# Patient Record
Sex: Female | Born: 1945 | Race: Black or African American | Hispanic: No | Marital: Single | Smoking: Never smoker
Health system: Southern US, Community
[De-identification: ages and names within clinical notes are randomized; demographics above are authoritative.]

---

## 1999-09-18 ENCOUNTER — Emergency Department (HOSPITAL_COMMUNITY): Admission: EM | Admit: 1999-09-18 | Discharge: 1999-09-18 | Payer: Self-pay | Admitting: Emergency Medicine

## 2004-03-29 ENCOUNTER — Encounter: Admission: RE | Admit: 2004-03-29 | Discharge: 2004-03-29 | Payer: Self-pay | Admitting: Orthopedic Surgery

## 2012-11-21 ENCOUNTER — Other Ambulatory Visit: Payer: Self-pay | Admitting: Family Medicine

## 2012-11-21 ENCOUNTER — Ambulatory Visit
Admission: RE | Admit: 2012-11-21 | Discharge: 2012-11-21 | Disposition: A | Discharge status: Home / Self Care | Payer: Medicare Other | Source: Ambulatory Visit | Attending: Family Medicine | Admitting: Family Medicine

## 2018-07-24 ENCOUNTER — Encounter (HOSPITAL_COMMUNITY): Payer: Self-pay

## 2018-07-24 ENCOUNTER — Other Ambulatory Visit: Payer: Self-pay

## 2018-07-24 ENCOUNTER — Inpatient Hospital Stay (HOSPITAL_COMMUNITY)
Admission: EM | Admit: 2018-07-24 | Discharge: 2018-08-09 | DRG: 003 | Disposition: A | Discharge status: Other Acute Inpatient Hospital | Payer: Medicare Other | Attending: Internal Medicine | Admitting: Internal Medicine

## 2018-07-24 ENCOUNTER — Emergency Department (HOSPITAL_COMMUNITY): Payer: Medicare Other

## 2018-07-24 DIAGNOSIS — I77819 Aortic ectasia, unspecified site: Secondary | ICD-10-CM | POA: Diagnosis present

## 2018-07-24 DIAGNOSIS — K08419 Partial loss of teeth due to trauma, unspecified class: Secondary | ICD-10-CM

## 2018-07-24 DIAGNOSIS — R1310 Dysphagia, unspecified: Secondary | ICD-10-CM | POA: Diagnosis not present

## 2018-07-24 DIAGNOSIS — G936 Cerebral edema: Secondary | ICD-10-CM | POA: Diagnosis not present

## 2018-07-24 DIAGNOSIS — I82433 Acute embolism and thrombosis of popliteal vein, bilateral: Secondary | ICD-10-CM | POA: Diagnosis present

## 2018-07-24 DIAGNOSIS — T189XXA Foreign body of alimentary tract, part unspecified, initial encounter: Secondary | ICD-10-CM | POA: Diagnosis not present

## 2018-07-24 DIAGNOSIS — E860 Dehydration: Secondary | ICD-10-CM | POA: Diagnosis present

## 2018-07-24 DIAGNOSIS — I615 Nontraumatic intracerebral hemorrhage, intraventricular: Secondary | ICD-10-CM

## 2018-07-24 DIAGNOSIS — G935 Compression of brain: Secondary | ICD-10-CM | POA: Diagnosis not present

## 2018-07-24 DIAGNOSIS — E876 Hypokalemia: Secondary | ICD-10-CM | POA: Diagnosis not present

## 2018-07-24 DIAGNOSIS — I272 Pulmonary hypertension, unspecified: Secondary | ICD-10-CM | POA: Diagnosis present

## 2018-07-24 DIAGNOSIS — N179 Acute kidney failure, unspecified: Secondary | ICD-10-CM | POA: Diagnosis present

## 2018-07-24 DIAGNOSIS — R778 Other specified abnormalities of plasma proteins: Secondary | ICD-10-CM | POA: Diagnosis present

## 2018-07-24 DIAGNOSIS — I2510 Atherosclerotic heart disease of native coronary artery without angina pectoris: Secondary | ICD-10-CM | POA: Diagnosis present

## 2018-07-24 DIAGNOSIS — I619 Nontraumatic intracerebral hemorrhage, unspecified: Secondary | ICD-10-CM | POA: Diagnosis present

## 2018-07-24 DIAGNOSIS — J9601 Acute respiratory failure with hypoxia: Secondary | ICD-10-CM | POA: Diagnosis not present

## 2018-07-24 DIAGNOSIS — F0390 Unspecified dementia without behavioral disturbance: Secondary | ICD-10-CM | POA: Diagnosis present

## 2018-07-24 DIAGNOSIS — J9600 Acute respiratory failure, unspecified whether with hypoxia or hypercapnia: Secondary | ICD-10-CM | POA: Diagnosis present

## 2018-07-24 DIAGNOSIS — Z79899 Other long term (current) drug therapy: Secondary | ICD-10-CM

## 2018-07-24 DIAGNOSIS — A4151 Sepsis due to Escherichia coli [E. coli]: Principal | ICD-10-CM | POA: Diagnosis present

## 2018-07-24 DIAGNOSIS — R7989 Other specified abnormal findings of blood chemistry: Secondary | ICD-10-CM | POA: Diagnosis present

## 2018-07-24 DIAGNOSIS — Z789 Other specified health status: Secondary | ICD-10-CM | POA: Diagnosis present

## 2018-07-24 DIAGNOSIS — E162 Hypoglycemia, unspecified: Secondary | ICD-10-CM | POA: Diagnosis not present

## 2018-07-24 DIAGNOSIS — I82461 Acute embolism and thrombosis of right calf muscular vein: Secondary | ICD-10-CM | POA: Diagnosis present

## 2018-07-24 DIAGNOSIS — I611 Nontraumatic intracerebral hemorrhage in hemisphere, cortical: Secondary | ICD-10-CM | POA: Diagnosis not present

## 2018-07-24 DIAGNOSIS — K5730 Diverticulosis of large intestine without perforation or abscess without bleeding: Secondary | ICD-10-CM | POA: Diagnosis present

## 2018-07-24 DIAGNOSIS — K0889 Other specified disorders of teeth and supporting structures: Secondary | ICD-10-CM | POA: Diagnosis present

## 2018-07-24 DIAGNOSIS — R4701 Aphasia: Secondary | ICD-10-CM | POA: Diagnosis not present

## 2018-07-24 DIAGNOSIS — I82443 Acute embolism and thrombosis of tibial vein, bilateral: Secondary | ICD-10-CM | POA: Diagnosis present

## 2018-07-24 DIAGNOSIS — I119 Hypertensive heart disease without heart failure: Secondary | ICD-10-CM | POA: Diagnosis present

## 2018-07-24 DIAGNOSIS — I2699 Other pulmonary embolism without acute cor pulmonale: Secondary | ICD-10-CM | POA: Diagnosis present

## 2018-07-24 DIAGNOSIS — R531 Weakness: Secondary | ICD-10-CM

## 2018-07-24 DIAGNOSIS — Z8042 Family history of malignant neoplasm of prostate: Secondary | ICD-10-CM

## 2018-07-24 DIAGNOSIS — G9341 Metabolic encephalopathy: Secondary | ICD-10-CM | POA: Diagnosis present

## 2018-07-24 DIAGNOSIS — I824Y3 Acute embolism and thrombosis of unspecified deep veins of proximal lower extremity, bilateral: Secondary | ICD-10-CM | POA: Diagnosis present

## 2018-07-24 DIAGNOSIS — E872 Acidosis: Secondary | ICD-10-CM | POA: Diagnosis present

## 2018-07-24 DIAGNOSIS — X58XXXA Exposure to other specified factors, initial encounter: Secondary | ICD-10-CM | POA: Diagnosis not present

## 2018-07-24 DIAGNOSIS — I82452 Acute embolism and thrombosis of left peroneal vein: Secondary | ICD-10-CM | POA: Diagnosis present

## 2018-07-24 DIAGNOSIS — R339 Retention of urine, unspecified: Secondary | ICD-10-CM | POA: Diagnosis not present

## 2018-07-24 DIAGNOSIS — Z430 Encounter for attention to tracheostomy: Secondary | ICD-10-CM

## 2018-07-24 DIAGNOSIS — J9690 Respiratory failure, unspecified, unspecified whether with hypoxia or hypercapnia: Secondary | ICD-10-CM

## 2018-07-24 DIAGNOSIS — I700 Atherosclerosis of aorta: Secondary | ICD-10-CM | POA: Diagnosis present

## 2018-07-24 DIAGNOSIS — Y92230 Patient room in hospital as the place of occurrence of the external cause: Secondary | ICD-10-CM | POA: Diagnosis not present

## 2018-07-24 DIAGNOSIS — E870 Hyperosmolality and hypernatremia: Secondary | ICD-10-CM | POA: Diagnosis present

## 2018-07-24 DIAGNOSIS — Z930 Tracheostomy status: Secondary | ICD-10-CM

## 2018-07-24 DIAGNOSIS — E559 Vitamin D deficiency, unspecified: Secondary | ICD-10-CM | POA: Diagnosis present

## 2018-07-24 DIAGNOSIS — I340 Nonrheumatic mitral (valve) insufficiency: Secondary | ICD-10-CM | POA: Diagnosis present

## 2018-07-24 DIAGNOSIS — T45515A Adverse effect of anticoagulants, initial encounter: Secondary | ICD-10-CM | POA: Diagnosis not present

## 2018-07-24 DIAGNOSIS — R197 Diarrhea, unspecified: Secondary | ICD-10-CM | POA: Diagnosis not present

## 2018-07-24 DIAGNOSIS — J189 Pneumonia, unspecified organism: Secondary | ICD-10-CM | POA: Diagnosis present

## 2018-07-24 DIAGNOSIS — Z4659 Encounter for fitting and adjustment of other gastrointestinal appliance and device: Secondary | ICD-10-CM

## 2018-07-24 DIAGNOSIS — N390 Urinary tract infection, site not specified: Secondary | ICD-10-CM | POA: Diagnosis present

## 2018-07-24 LAB — I-STAT CG4 LACTIC ACID, ED
LACTIC ACID, VENOUS: 1.55 mmol/L (ref 0.5–1.9)
Lactic Acid, Venous: 2.59 mmol/L (ref 0.5–1.9)

## 2018-07-24 LAB — CBC WITH DIFFERENTIAL/PLATELET
Abs Immature Granulocytes: 0.05 10*3/uL (ref 0.00–0.07)
Basophils Absolute: 0 10*3/uL (ref 0.0–0.1)
Basophils Relative: 0 %
EOS ABS: 0 10*3/uL (ref 0.0–0.5)
EOS PCT: 0 %
HEMATOCRIT: 54.5 % — AB (ref 36.0–46.0)
Hemoglobin: 17.4 g/dL — ABNORMAL HIGH (ref 12.0–15.0)
Immature Granulocytes: 1 %
LYMPHS ABS: 0.6 10*3/uL — AB (ref 0.7–4.0)
Lymphocytes Relative: 6 %
MCH: 29.6 pg (ref 26.0–34.0)
MCHC: 31.9 g/dL (ref 30.0–36.0)
MCV: 92.7 fL (ref 80.0–100.0)
MONOS PCT: 7 %
Monocytes Absolute: 0.7 10*3/uL (ref 0.1–1.0)
Neutro Abs: 8.7 10*3/uL — ABNORMAL HIGH (ref 1.7–7.7)
Neutrophils Relative %: 86 %
Platelets: 239 10*3/uL (ref 150–400)
RBC: 5.88 MIL/uL — ABNORMAL HIGH (ref 3.87–5.11)
RDW: 15.3 % (ref 11.5–15.5)
WBC: 10.1 10*3/uL (ref 4.0–10.5)
nRBC: 0 % (ref 0.0–0.2)

## 2018-07-24 LAB — COMPREHENSIVE METABOLIC PANEL
ALT: 18 U/L (ref 0–44)
ANION GAP: 14 (ref 5–15)
AST: 22 U/L (ref 15–41)
Albumin: 3.8 g/dL (ref 3.5–5.0)
Alkaline Phosphatase: 67 U/L (ref 38–126)
BILIRUBIN TOTAL: 1.2 mg/dL (ref 0.3–1.2)
BUN: 63 mg/dL — AB (ref 8–23)
CALCIUM: 9.8 mg/dL (ref 8.9–10.3)
CO2: 22 mmol/L (ref 22–32)
Chloride: 107 mmol/L (ref 98–111)
Creatinine, Ser: 1.06 mg/dL — ABNORMAL HIGH (ref 0.44–1.00)
GFR calc Af Amer: 58 mL/min — ABNORMAL LOW (ref >60)
GFR, EST NON AFRICAN AMERICAN: 50 mL/min — AB (ref >60)
Glucose, Bld: 132 mg/dL — ABNORMAL HIGH (ref 70–99)
POTASSIUM: 3.9 mmol/L (ref 3.5–5.1)
Sodium: 143 mmol/L (ref 135–145)
TOTAL PROTEIN: 9.2 g/dL — AB (ref 6.5–8.1)

## 2018-07-24 LAB — I-STAT TROPONIN, ED: Troponin i, poc: 0.15 ng/mL (ref 0.00–0.08)

## 2018-07-24 LAB — PROTIME-INR
INR: 1.09
Prothrombin Time: 14 seconds (ref 11.4–15.2)

## 2018-07-24 LAB — CBG MONITORING, ED: GLUCOSE-CAPILLARY: 82 mg/dL (ref 70–99)

## 2018-07-24 LAB — CK: CK TOTAL: 145 U/L (ref 38–234)

## 2018-07-24 MED ORDER — SODIUM CHLORIDE 0.9 % IV SOLN
Freq: Once | INTRAVENOUS | Status: AC
  Administered 2018-07-24: via INTRAVENOUS

## 2018-07-24 MED ORDER — SODIUM CHLORIDE 0.9 % IV BOLUS
1000.0000 mL | Freq: Once | INTRAVENOUS | Status: AC
  Administered 2018-07-24: 1000 mL via INTRAVENOUS

## 2018-07-24 NOTE — ED Notes (Signed)
Bed: [id number] Expected date:  Expected time:  Means of arrival:  Comments: EMS 72 yr old UTI

## 2018-07-24 NOTE — ED Provider Notes (Signed)
[facility]-EMERGENCY DEPT Provider Note   CSN: [id number] Arrival date & time: 07/24/18  1853     History   Chief Complaint Chief Complaint  Patient presents with  . Weakness    HPI Hannah Holloway is a 72 y.o. female.  72 year old female with prior medical history as detailed below presents for evaluation. Patient reports that she lives with her sister. Her sister went to [place] 4 days ago. Patient reports increased weakness over the last 3 days.  EMS reports that they were at least 4 days of unopened newspapers at the door.  Patient complains of generalized weakness.  She denies chest pain, fever, shortness of breath, nausea, vomiting, or abdominal pain. Her clothes are soaked in urine - she reports that she has been too weak to walk to the bathroom for at least the last 2 days.   The history is provided by the patient and medical records.  Illness  This is a new problem. The current episode started more than 2 days ago. The problem occurs rarely. The problem has not changed since onset.Pertinent negatives include no chest pain, no abdominal pain, no headaches and no shortness of breath. Nothing aggravates the symptoms. Nothing relieves the symptoms. She has tried nothing for the symptoms.    History reviewed. No pertinent past medical history.  There are no active problems to display for this patient.   History reviewed. No pertinent surgical history.   OB History   None      Home Medications    Prior to Admission medications   Medication Sig Start Date End Date Taking? Authorizing Provider  Cholecalciferol (VITAMIN D3) 25 MCG (1000 UT) CAPS Take 1,000 Units by mouth daily.   Yes [provider]  vitamin E 400 UNIT capsule Take 400 Units by mouth daily.   Yes [provider]    Family History No family history on file.  Social History Social History   Tobacco Use  . Smoking status: Not on file  Substance Use Topics  .  Alcohol use: Not on file  . Drug use: Not on file     Allergies   Patient has no known allergies.   Review of Systems Review of Systems  Constitutional: Positive for fatigue.  Respiratory: Negative for shortness of breath.   Cardiovascular: Negative for chest pain.  Gastrointestinal: Negative for abdominal pain.  Neurological: Negative for headaches.  All other systems reviewed and are negative.    Physical Exam Updated Vital Signs BP (!) 171/89   Pulse (!) 40   Temp 97.7 F (36.5 C) (Oral)   Resp (!) 28   Ht 5\' 6"  (1.676 m)   Wt 72.6 kg   SpO2 (!) 81%   BMI 25.82 kg/m   Physical Exam  Constitutional: She appears well-developed and well-nourished. No distress.  HENT:  Head: Normocephalic and atraumatic.  Mouth/Throat: Oropharynx is clear and moist.  Eyes: Pupils are equal, round, and reactive to light. Conjunctivae and EOM are normal.  Neck: Normal range of motion. Neck supple.  Cardiovascular: Normal rate, regular rhythm and normal heart sounds.  Pulmonary/Chest: Effort normal and breath sounds normal. No respiratory distress.  Abdominal: Soft. She exhibits no distension. There is no tenderness.  Musculoskeletal: Normal range of motion. She exhibits no edema or deformity.  Neurological: She is alert.  Alert Normal speech No facial droop VAN negative   Skin: Skin is warm and dry.  Psychiatric: She has a normal mood and affect.  Nursing note and vitals reviewed.    ED Treatments / Results  Labs (all labs ordered are listed, but only abnormal results are displayed) Labs Reviewed  CBC WITH DIFFERENTIAL/PLATELET - Abnormal; Notable for the following components:      Result Value   RBC 5.88 (*)    Hemoglobin 17.4 (*)    HCT 54.5 (*)    Neutro Abs 8.7 (*)    Lymphs Abs 0.6 (*)    All other components within normal limits  COMPREHENSIVE METABOLIC PANEL - Abnormal; Notable for the following components:   Glucose, Bld 132 (*)    BUN 63 (*)    Creatinine,  Ser 1.06 (*)    Total Protein 9.2 (*)    GFR calc non Af Amer 50 (*)    GFR calc Af Amer 58 (*)    All other components within normal limits  I-STAT TROPONIN, ED - Abnormal; Notable for the following components:   Troponin i, poc 0.15 (*)    All other components within normal limits  I-STAT CG4 LACTIC ACID, ED - Abnormal; Notable for the following components:   Lactic Acid, Venous 2.59 (*)    All other components within normal limits  URINE CULTURE  CULTURE, BLOOD (ROUTINE X 2)  CULTURE, BLOOD (ROUTINE X 2)  PROTIME-INR  URINALYSIS, ROUTINE W REFLEX MICROSCOPIC  CK  CBG MONITORING, ED  I-STAT CG4 LACTIC ACID, ED    EKG EKG Interpretation  Date/Time:  Wednesday July 24 2018 20:03:18 EST Ventricular Rate:  100 PR Interval:    QRS Duration: 83 QT Interval:  348 QTC Calculation: 449 R Axis:   -38 Text Interpretation:  Sinus tachycardia Biatrial enlargement Left ventricular hypertrophy Confirmed by Kristine RoyalMessick,  [phone]([id number]) on 07/24/2018 8:06:02 PM   Radiology Dg Chest Port 1 View  Result Date: 07/24/2018 CLINICAL DATA:  Weakness EXAM: PORTABLE CHEST 1 VIEW COMPARISON:  11/21/2012 FINDINGS: New elevation of the right hemidiaphragm. Accentuated fullness in the AP window region compared to prior. Atherosclerotic calcification of the aortic arch. IMPRESSION: 1. New elevation of the right hemidiaphragm. 2. Accentuated fullness in the AP window region compared to prior, cannot exclude adenopathy or mass. Consider chest CT for further evaluation. Electronically Signed   By: Gaylyn RongWalter  Liebkemann M.D.   On: 07/24/2018 20:34    Procedures Procedures (including critical care time)  Medications Ordered in ED Medications  0.9 %  sodium chloride infusion (has no administration in time range)  sodium chloride 0.9 % bolus 1,000 mL (0 mLs Intravenous Stopped 07/24/18 2116)     Initial Impression / Assessment and Plan / ED Course  I have reviewed the triage vital signs and the nursing  notes.  Pertinent labs & imaging results that were available during my care of the patient were reviewed by me and considered in my medical decision making (see chart for details).     MDM  Screen complete  Patient is presenting for generalized weakness.  Patient with minimal history obtained from family/bystanders. It appears as though the patient has had minimal care for the last several days.    Screening exam and work-up suggest mild to moderate dehydration. Patient also with mildly elevated troponin.   Hospitalist service [facility] is aware of case and will evaluate for admission. UA and CT Head pending at time of admission.     Final Clinical Impressions(s) / ED Diagnoses   Final diagnoses:  Weakness  Elevated troponin    ED Discharge Orders    None  Wynetta Fines, MD 07/25/18 [phone]

## 2018-07-24 NOTE — ED Notes (Signed)
Pt is still unable to give urine specimen at this time.

## 2018-07-24 NOTE — ED Notes (Signed)
Pt aware that urine sample is needed. Purewick attached

## 2018-07-24 NOTE — ED Triage Notes (Signed)
This pt. Normally lives with her sister. Her sister has recently been out of town; therefore she had family visit today who hadn't seen pt. In several days. They found pt. To be more weak and "less responsive" than normal. Paramedics observed four days worth of uncollected newspapers on her door step. Pt. Arrives in no distress and is a bit drowsy. She is aware of her situation and tells us she is not having any pain or discomfort. She has been incontinent of urine.

## 2018-07-25 ENCOUNTER — Observation Stay (HOSPITAL_COMMUNITY): Payer: Medicare Other

## 2018-07-25 ENCOUNTER — Inpatient Hospital Stay (HOSPITAL_COMMUNITY): Payer: Medicare Other | Admitting: Anesthesiology

## 2018-07-25 ENCOUNTER — Emergency Department (HOSPITAL_COMMUNITY): Payer: Medicare Other

## 2018-07-25 ENCOUNTER — Observation Stay (HOSPITAL_COMMUNITY)
Admit: 2018-07-25 | Discharge: 2018-07-25 | Disposition: A | Discharge status: Home / Self Care | Payer: Medicare Other | Attending: Family Medicine | Admitting: Family Medicine

## 2018-07-25 ENCOUNTER — Inpatient Hospital Stay (HOSPITAL_COMMUNITY): Payer: Medicare Other

## 2018-07-25 ENCOUNTER — Encounter (HOSPITAL_COMMUNITY): Payer: Self-pay | Admitting: Internal Medicine

## 2018-07-25 DIAGNOSIS — I2699 Other pulmonary embolism without acute cor pulmonale: Secondary | ICD-10-CM | POA: Diagnosis not present

## 2018-07-25 DIAGNOSIS — R531 Weakness: Secondary | ICD-10-CM | POA: Diagnosis present

## 2018-07-25 DIAGNOSIS — R778 Other specified abnormalities of plasma proteins: Secondary | ICD-10-CM | POA: Diagnosis present

## 2018-07-25 DIAGNOSIS — R7989 Other specified abnormal findings of blood chemistry: Secondary | ICD-10-CM

## 2018-07-25 DIAGNOSIS — R4701 Aphasia: Secondary | ICD-10-CM | POA: Diagnosis not present

## 2018-07-25 DIAGNOSIS — G935 Compression of brain: Secondary | ICD-10-CM | POA: Diagnosis not present

## 2018-07-25 DIAGNOSIS — I824Y3 Acute embolism and thrombosis of unspecified deep veins of proximal lower extremity, bilateral: Secondary | ICD-10-CM | POA: Diagnosis present

## 2018-07-25 DIAGNOSIS — N179 Acute kidney failure, unspecified: Secondary | ICD-10-CM | POA: Diagnosis present

## 2018-07-25 DIAGNOSIS — E860 Dehydration: Secondary | ICD-10-CM | POA: Diagnosis present

## 2018-07-25 DIAGNOSIS — R1310 Dysphagia, unspecified: Secondary | ICD-10-CM | POA: Diagnosis not present

## 2018-07-25 DIAGNOSIS — I615 Nontraumatic intracerebral hemorrhage, intraventricular: Secondary | ICD-10-CM | POA: Diagnosis not present

## 2018-07-25 DIAGNOSIS — I82452 Acute embolism and thrombosis of left peroneal vein: Secondary | ICD-10-CM | POA: Diagnosis present

## 2018-07-25 DIAGNOSIS — R0602 Shortness of breath: Secondary | ICD-10-CM

## 2018-07-25 DIAGNOSIS — E870 Hyperosmolality and hypernatremia: Secondary | ICD-10-CM | POA: Diagnosis present

## 2018-07-25 DIAGNOSIS — I82443 Acute embolism and thrombosis of tibial vein, bilateral: Secondary | ICD-10-CM | POA: Diagnosis present

## 2018-07-25 DIAGNOSIS — Z789 Other specified health status: Secondary | ICD-10-CM | POA: Diagnosis present

## 2018-07-25 DIAGNOSIS — J189 Pneumonia, unspecified organism: Secondary | ICD-10-CM | POA: Diagnosis present

## 2018-07-25 DIAGNOSIS — J988 Other specified respiratory disorders: Secondary | ICD-10-CM | POA: Diagnosis not present

## 2018-07-25 DIAGNOSIS — J9601 Acute respiratory failure with hypoxia: Secondary | ICD-10-CM | POA: Diagnosis not present

## 2018-07-25 DIAGNOSIS — I82433 Acute embolism and thrombosis of popliteal vein, bilateral: Secondary | ICD-10-CM | POA: Diagnosis present

## 2018-07-25 DIAGNOSIS — E872 Acidosis: Secondary | ICD-10-CM | POA: Diagnosis present

## 2018-07-25 DIAGNOSIS — X58XXXA Exposure to other specified factors, initial encounter: Secondary | ICD-10-CM | POA: Diagnosis not present

## 2018-07-25 DIAGNOSIS — A4151 Sepsis due to Escherichia coli [E. coli]: Secondary | ICD-10-CM | POA: Diagnosis present

## 2018-07-25 DIAGNOSIS — I272 Pulmonary hypertension, unspecified: Secondary | ICD-10-CM | POA: Diagnosis present

## 2018-07-25 DIAGNOSIS — I340 Nonrheumatic mitral (valve) insufficiency: Secondary | ICD-10-CM

## 2018-07-25 DIAGNOSIS — I619 Nontraumatic intracerebral hemorrhage, unspecified: Secondary | ICD-10-CM

## 2018-07-25 DIAGNOSIS — T189XXA Foreign body of alimentary tract, part unspecified, initial encounter: Secondary | ICD-10-CM | POA: Diagnosis not present

## 2018-07-25 DIAGNOSIS — N390 Urinary tract infection, site not specified: Secondary | ICD-10-CM | POA: Diagnosis present

## 2018-07-25 DIAGNOSIS — F0390 Unspecified dementia without behavioral disturbance: Secondary | ICD-10-CM | POA: Diagnosis present

## 2018-07-25 DIAGNOSIS — I119 Hypertensive heart disease without heart failure: Secondary | ICD-10-CM | POA: Diagnosis present

## 2018-07-25 DIAGNOSIS — I82461 Acute embolism and thrombosis of right calf muscular vein: Secondary | ICD-10-CM | POA: Diagnosis present

## 2018-07-25 DIAGNOSIS — G9341 Metabolic encephalopathy: Secondary | ICD-10-CM | POA: Diagnosis not present

## 2018-07-25 DIAGNOSIS — Y92230 Patient room in hospital as the place of occurrence of the external cause: Secondary | ICD-10-CM | POA: Diagnosis not present

## 2018-07-25 DIAGNOSIS — I611 Nontraumatic intracerebral hemorrhage in hemisphere, cortical: Secondary | ICD-10-CM | POA: Diagnosis not present

## 2018-07-25 DIAGNOSIS — G936 Cerebral edema: Secondary | ICD-10-CM | POA: Diagnosis not present

## 2018-07-25 DIAGNOSIS — R29898 Other symptoms and signs involving the musculoskeletal system: Secondary | ICD-10-CM

## 2018-07-25 LAB — CBC
HCT: 43.6 % (ref 36.0–46.0)
Hemoglobin: 14.3 g/dL (ref 12.0–15.0)
MCH: 30.4 pg (ref 26.0–34.0)
MCHC: 32.8 g/dL (ref 30.0–36.0)
MCV: 92.6 fL (ref 80.0–100.0)
NRBC: 0 % (ref 0.0–0.2)
PLATELETS: 188 10*3/uL (ref 150–400)
RBC: 4.71 MIL/uL (ref 3.87–5.11)
RDW: 15.3 % (ref 11.5–15.5)
WBC: 11.1 10*3/uL — ABNORMAL HIGH (ref 4.0–10.5)

## 2018-07-25 LAB — BLOOD GAS, ARTERIAL
Acid-Base Excess: 1.4 mmol/L (ref 0.0–2.0)
Bicarbonate: 23.9 mmol/L (ref 20.0–28.0)
Drawn by: 235321
FIO2: 100
LHR: 18 {breaths}/min
MECHVT: 470 mL
O2 Saturation: 99.9 %
PATIENT TEMPERATURE: 98.6
PEEP: 5 cmH2O
PO2 ART: 382 mmHg — AB (ref 83.0–108.0)
pCO2 arterial: 32.2 mmHg (ref 32.0–48.0)
pH, Arterial: 7.482 — ABNORMAL HIGH (ref 7.350–7.450)

## 2018-07-25 LAB — PHOSPHORUS: PHOSPHORUS: 1.9 mg/dL — AB (ref 2.5–4.6)

## 2018-07-25 LAB — URINALYSIS, ROUTINE W REFLEX MICROSCOPIC
Bilirubin Urine: NEGATIVE
Glucose, UA: NEGATIVE mg/dL
Ketones, ur: 20 mg/dL — AB
Nitrite: NEGATIVE
Protein, ur: 100 mg/dL — AB
Specific Gravity, Urine: 1.023 (ref 1.005–1.030)
pH: 5 (ref 5.0–8.0)

## 2018-07-25 LAB — TROPONIN I
TROPONIN I: 0.1 ng/mL — AB (ref <0.03)
TROPONIN I: 0.06 ng/mL — AB (ref <0.03)
Troponin I: 0.13 ng/mL (ref <0.03)

## 2018-07-25 LAB — ECHOCARDIOGRAM COMPLETE
Height: 66 in
Weight: 2560 oz

## 2018-07-25 LAB — COMPREHENSIVE METABOLIC PANEL
ALT: 17 U/L (ref 0–44)
ANION GAP: 12 (ref 5–15)
AST: 25 U/L (ref 15–41)
Albumin: 3.1 g/dL — ABNORMAL LOW (ref 3.5–5.0)
Alkaline Phosphatase: 53 U/L (ref 38–126)
BUN: 41 mg/dL — ABNORMAL HIGH (ref 8–23)
CHLORIDE: 114 mmol/L — AB (ref 98–111)
CO2: 21 mmol/L — AB (ref 22–32)
Calcium: 9.4 mg/dL (ref 8.9–10.3)
Creatinine, Ser: 0.68 mg/dL (ref 0.44–1.00)
GFR calc Af Amer: >60 mL/min (ref >60)
GFR calc non Af Amer: >60 mL/min (ref >60)
Glucose, Bld: 128 mg/dL — ABNORMAL HIGH (ref 70–99)
POTASSIUM: 3.9 mmol/L (ref 3.5–5.1)
SODIUM: 147 mmol/L — AB (ref 135–145)
Total Bilirubin: 1.5 mg/dL — ABNORMAL HIGH (ref 0.3–1.2)
Total Protein: 7.8 g/dL (ref 6.5–8.1)

## 2018-07-25 LAB — SODIUM: SODIUM: 144 mmol/L (ref 135–145)

## 2018-07-25 LAB — PROTIME-INR
INR: 1.09
PROTHROMBIN TIME: 14 s (ref 11.4–15.2)

## 2018-07-25 LAB — HEPARIN LEVEL (UNFRACTIONATED): Heparin Unfractionated: 0.28 IU/mL — ABNORMAL LOW (ref 0.30–0.70)

## 2018-07-25 LAB — MAGNESIUM: Magnesium: 2.6 mg/dL — ABNORMAL HIGH (ref 1.7–2.4)

## 2018-07-25 LAB — HEMOGLOBIN A1C
Hgb A1c MFr Bld: 5.7 % — ABNORMAL HIGH (ref 4.8–5.6)
Mean Plasma Glucose: 116.89 mg/dL

## 2018-07-25 LAB — D-DIMER, QUANTITATIVE (NOT AT ARMC)

## 2018-07-25 LAB — STREP PNEUMONIAE URINARY ANTIGEN: Strep Pneumo Urinary Antigen: NEGATIVE

## 2018-07-25 LAB — TSH: TSH: 0.568 u[IU]/mL (ref 0.350–4.500)

## 2018-07-25 MED ORDER — FENTANYL 2500MCG IN NS 250ML (10MCG/ML) PREMIX INFUSION
0.0000 ug/h | INTRAVENOUS | Status: DC
  Administered 2018-07-25: 25 ug/h via INTRAVENOUS
  Administered 2018-07-27 – 2018-07-29 (×2): 50 ug/h via INTRAVENOUS
  Filled 2018-07-25 (×3): qty 250

## 2018-07-25 MED ORDER — NICARDIPINE HCL IN NACL 20-0.86 MG/200ML-% IV SOLN
0.0000 mg/h | INTRAVENOUS | Status: DC
  Administered 2018-07-25: 5 mg/h via INTRAVENOUS
  Administered 2018-07-25: 7.5 mg/h via INTRAVENOUS
  Administered 2018-07-25 – 2018-07-26 (×6): 10 mg/h via INTRAVENOUS
  Filled 2018-07-25 (×8): qty 200

## 2018-07-25 MED ORDER — IOPAMIDOL (ISOVUE-370) INJECTION 76%
INTRAVENOUS | Status: AC
  Filled 2018-07-25: qty 100

## 2018-07-25 MED ORDER — PROPOFOL 1000 MG/100ML IV EMUL
5.0000 ug/kg/min | INTRAVENOUS | Status: DC
  Administered 2018-07-25: 5 ug/kg/min via INTRAVENOUS
  Filled 2018-07-25: qty 100

## 2018-07-25 MED ORDER — HEPARIN (PORCINE) 25000 UT/250ML-% IV SOLN
1050.0000 [IU]/h | INTRAVENOUS | Status: DC
  Administered 2018-07-25: 1050 [IU]/h via INTRAVENOUS
  Filled 2018-07-25: qty 250

## 2018-07-25 MED ORDER — PROPOFOL 10 MG/ML IV BOLUS
INTRAVENOUS | Status: AC
  Filled 2018-07-25: qty 20

## 2018-07-25 MED ORDER — MANNITOL 25 % IV SOLN
80.0000 g | Freq: Once | Status: AC
  Administered 2018-07-25: 80 g via INTRAVENOUS
  Filled 2018-07-25: qty 320

## 2018-07-25 MED ORDER — SUCCINYLCHOLINE CHLORIDE 200 MG/10ML IV SOSY
PREFILLED_SYRINGE | INTRAVENOUS | Status: AC
  Filled 2018-07-25: qty 10

## 2018-07-25 MED ORDER — LIDOCAINE HCL (CARDIAC) PF 100 MG/5ML IV SOSY: 1.5000 mg/kg | PREFILLED_SYRINGE | Freq: Once | INTRAVENOUS | Status: DC

## 2018-07-25 MED ORDER — HEPARIN BOLUS VIA INFUSION
2000.0000 [IU] | Freq: Once | INTRAVENOUS | Status: AC
  Administered 2018-07-25: 2000 [IU] via INTRAVENOUS
  Filled 2018-07-25: qty 2000

## 2018-07-25 MED ORDER — POTASSIUM PHOSPHATE MONOBASIC 500 MG PO TABS
500.0000 mg | ORAL_TABLET | Freq: Three times a day (TID) | ORAL | Status: DC
  Filled 2018-07-25 (×5): qty 1

## 2018-07-25 MED ORDER — SODIUM CHLORIDE 0.9 % IV SOLN
1.0000 g | INTRAVENOUS | Status: DC
  Administered 2018-07-25 – 2018-07-31 (×7): 1 g via INTRAVENOUS
  Filled 2018-07-25: qty 1
  Filled 2018-07-25 (×6): qty 10

## 2018-07-25 MED ORDER — CLEVIDIPINE BUTYRATE 0.5 MG/ML IV EMUL
0.0000 mg/h | INTRAVENOUS | Status: DC
  Filled 2018-07-25: qty 50

## 2018-07-25 MED ORDER — GADOBUTROL 1 MMOL/ML IV SOLN
7.0000 mL | Freq: Once | INTRAVENOUS | Status: AC | PRN
  Administered 2018-07-25: 7 mL via INTRAVENOUS

## 2018-07-25 MED ORDER — PROTAMINE SULFATE 10 MG/ML IV SOLN
50.0000 mg | Freq: Once | INTRAVENOUS | Status: AC
  Administered 2018-07-25: 50 mg via INTRAVENOUS
  Filled 2018-07-25: qty 5

## 2018-07-25 MED ORDER — IOPAMIDOL (ISOVUE-370) INJECTION 76%
100.0000 mL | Freq: Once | INTRAVENOUS | Status: AC | PRN
  Administered 2018-07-25: 100 mL via INTRAVENOUS

## 2018-07-25 MED ORDER — IPRATROPIUM-ALBUTEROL 0.5-2.5 (3) MG/3ML IN SOLN
3.0000 mL | Freq: Four times a day (QID) | RESPIRATORY_TRACT | Status: DC
  Administered 2018-07-25 – 2018-07-29 (×15): 3 mL via RESPIRATORY_TRACT
  Filled 2018-07-25 (×15): qty 3

## 2018-07-25 MED ORDER — SODIUM CHLORIDE 0.9 % IV SOLN
INTRAVENOUS | Status: DC
  Administered 2018-07-25: 03:00:00 via INTRAVENOUS

## 2018-07-25 MED ORDER — SODIUM CHLORIDE (PF) 0.9 % IJ SOLN
INTRAMUSCULAR | Status: AC
  Filled 2018-07-25: qty 50

## 2018-07-25 MED ORDER — SUCCINYLCHOLINE CHLORIDE 200 MG/10ML IV SOSY
PREFILLED_SYRINGE | INTRAVENOUS | Status: DC | PRN
  Administered 2018-07-25: 100 mg via INTRAVENOUS

## 2018-07-25 MED ORDER — ONDANSETRON HCL 4 MG/2ML IJ SOLN: 4.0000 mg | Freq: Four times a day (QID) | INTRAMUSCULAR | Status: DC | PRN

## 2018-07-25 MED ORDER — ACETAMINOPHEN 325 MG PO TABS: 650.0000 mg | ORAL_TABLET | Freq: Four times a day (QID) | ORAL | Status: DC | PRN

## 2018-07-25 MED ORDER — SODIUM CHLORIDE 0.9 % IV SOLN
500.0000 mg | INTRAVENOUS | Status: DC
  Administered 2018-07-25 – 2018-07-29 (×5): 500 mg via INTRAVENOUS
  Administered 2018-07-30: 250 mg via INTRAVENOUS
  Administered 2018-07-31: 500 mg via INTRAVENOUS
  Filled 2018-07-25 (×7): qty 500

## 2018-07-25 MED ORDER — SODIUM CHLORIDE 3 % IV SOLN
INTRAVENOUS | Status: DC
  Administered 2018-07-25: 75 mL/h via INTRAVENOUS
  Filled 2018-07-25 (×3): qty 500

## 2018-07-25 MED ORDER — ACETAMINOPHEN 650 MG RE SUPP
650.0000 mg | Freq: Four times a day (QID) | RECTAL | Status: DC | PRN
  Administered 2018-07-26: 650 mg via RECTAL
  Filled 2018-07-25: qty 1

## 2018-07-25 MED ORDER — MANNITOL 20 % IV SOLN
80.0000 g | Freq: Once | Status: DC
  Filled 2018-07-25: qty 400

## 2018-07-25 MED ORDER — DEXTROSE 5 % IV SOLN
INTRAVENOUS | Status: DC
  Administered 2018-07-25: 12:00:00 via INTRAVENOUS

## 2018-07-25 MED ORDER — PROPOFOL 10 MG/ML IV BOLUS
INTRAVENOUS | Status: DC | PRN
  Administered 2018-07-25: 50 mg via INTRAVENOUS

## 2018-07-25 MED ORDER — LABETALOL HCL 5 MG/ML IV SOLN: 20.0000 mg | Freq: Once | INTRAVENOUS | Status: DC

## 2018-07-25 MED ORDER — ONDANSETRON HCL 4 MG PO TABS: 4.0000 mg | ORAL_TABLET | Freq: Four times a day (QID) | ORAL | Status: DC | PRN

## 2018-07-25 MED ORDER — HEPARIN (PORCINE) 25000 UT/250ML-% IV SOLN: 1200.0000 [IU]/h | INTRAVENOUS | Status: DC

## 2018-07-25 NOTE — Anesthesia Procedure Notes (Signed)
Procedure Name: Intubation Date/Time: 07/25/2018 7:41 PM Performed by: Elyn PeersAllen, Nameer Summer J, CRNA Pre-anesthesia Checklist: Patient identified, Emergency Drugs available, Suction available, Patient being monitored and Timeout performed Patient Re-evaluated:Patient Re-evaluated prior to induction Oxygen Delivery Method: Ambu bag Preoxygenation: Pre-oxygenation with 100% oxygen Induction Type: IV induction Laryngoscope Size: 3 and Glidescope Grade View: Grade I Tube type: Subglottic suction tube Tube size: 7.0 mm Number of attempts: 1 Airway Equipment and Method: Stylet Placement Confirmation: ETT inserted through vocal cords under direct vision,  breath sounds checked- equal and bilateral and CO2 detector Secured at: 22 cm Tube secured with: Tape Dental Injury: Teeth and Oropharynx as per pre-operative assessment

## 2018-07-25 NOTE — Progress Notes (Signed)
Pharmacy Note   Pharmacy is consulted to follow Na levels after start of 3% NaCl drip due to Acute Intraparenchymal and Intraventricular Hemorrhage s/p anticoagulation for Acute Pulmonary embolus and lower ext thrombosis.   Na level at 11/21 at 2019 is 144.   Next level scheduled 6 hours later.  Will continue to monitor.  Adalberto ColeNikola Brayden Betters, PharmD, BCPS Pager [phone] 07/25/2018 9:51 PM

## 2018-07-25 NOTE — ED Notes (Signed)
Contact number:  BrotherDanella Deis- Carroll Carter [phone] and [phone] home  Sis-n-law- Koleen NimrodFredia [phone]  Brother- Ree KidaJack [phone]

## 2018-07-25 NOTE — Anesthesia Postprocedure Evaluation (Signed)
Anesthesia Post Note  Patient: Hannah Holloway  Procedure(s) Performed: AN AD HOC INTUBATION     Patient location during evaluation: ICU Anesthesia Type: General Level of consciousness: sedated Pain management: pain level controlled Vital Signs Assessment: vitals unstable Respiratory status: patient remains intubated per anesthesia plan Cardiovascular status: unstable Postop Assessment: no apparent nausea or vomiting Anesthetic complications: no Comments: Emergency intubation for intracranial bleed with altered mental status. BP severely elevated on arrival and remained elevated after intubation.    Last Vitals:  Vitals:   07/25/18 1946 07/25/18 1950  BP:  (!) 175/136  Pulse: (!) 117 71  Resp: 18 (!) 24  Temp:    SpO2: 100% 100%    Last Pain:  Vitals:   07/25/18 1627  TempSrc: Oral  PainSc:                  Lucretia Kernarolyn E Chemika Nightengale

## 2018-07-25 NOTE — ED Notes (Signed)
ED TO INPATIENT HANDOFF REPORT  Name/Age/Gender Hannah Holloway 72 y.o. female  Code Status    Code Status Orders  (From admission, onward)         Start     Ordered   07/25/18 0134  Full code  Continuous     07/25/18 0133        Code Status History    This patient has a current code status but no historical code status.      Home/SNF/Other Home  Chief Complaint UTI  Level of Care/Admitting Diagnosis ED Disposition    ED Disposition Condition Comment   Admit  Hospital Area: [place] [[id number]]  Level of Care: Telemetry [5]  Admit to tele based on following criteria: Monitor for Ischemic changes  Diagnosis: Elevated troponin [008676]  Admitting Physician: Elmarie Shiley [phone]  Attending Physician: Elmarie Shiley [phone]  Estimated length of stay: past midnight tomorrow  Certification:: I certify this patient will need inpatient services for at least 2 midnights  PT Class (Do Not Modify): Inpatient [101]  PT Acc Code (Do Not Modify): Private Telemetry [10024]       Medical History History reviewed. No pertinent past medical history.  Allergies No Known Allergies  IV Location/Drains/Wounds Patient Lines/Drains/Airways Status   Active Line/Drains/Airways    Name:   Placement date:   Placement time:   Site:   Days:   Peripheral IV 07/24/18 Left Antecubital   07/24/18    1845    Antecubital   1   Peripheral IV 07/24/18 Right Antecubital   07/24/18    1850    Antecubital   1          Labs/Imaging Results for orders placed or performed during the hospital encounter of 07/24/18 (from the past 48 hour(s))  CBC with Differential     Status: Abnormal   Collection Time: 07/24/18  7:50 PM  Result Value Ref Range   WBC 10.1 4.0 - 10.5 K/uL   RBC 5.88 (H) 3.87 - 5.11 MIL/uL   Hemoglobin 17.4 (H) 12.0 - 15.0 g/dL   HCT 54.5 (H) 36.0 - 46.0 %   MCV 92.7 80.0 - 100.0 fL   MCH 29.6 26.0 - 34.0 pg   MCHC 31.9 30.0 - 36.0 g/dL   RDW  15.3 11.5 - 15.5 %   Platelets 239 150 - 400 K/uL   nRBC 0.0 0.0 - 0.2 %   Neutrophils Relative % 86 %   Neutro Abs 8.7 (H) 1.7 - 7.7 K/uL   Lymphocytes Relative 6 %   Lymphs Abs 0.6 (L) 0.7 - 4.0 K/uL   Monocytes Relative 7 %   Monocytes Absolute 0.7 0.1 - 1.0 K/uL   Eosinophils Relative 0 %   Eosinophils Absolute 0.0 0.0 - 0.5 K/uL   Basophils Relative 0 %   Basophils Absolute 0.0 0.0 - 0.1 K/uL   Immature Granulocytes 1 %   Abs Immature Granulocytes 0.05 0.00 - 0.07 K/uL    Comment: Performed at [facility], [place] [address] [place], [place] [address]  Comprehensive metabolic panel     Status: Abnormal   Collection Time: 07/24/18  7:50 PM  Result Value Ref Range   Sodium 143 135 - 145 mmol/L   Potassium 3.9 3.5 - 5.1 mmol/L   Chloride 107 98 - 111 mmol/L   CO2 22 22 - 32 mmol/L   Glucose, Bld 132 (H) 70 - 99 mg/dL   BUN 63 (H) 8 -  23 mg/dL   Creatinine, Ser 1.06 (H) 0.44 - 1.00 mg/dL   Calcium 9.8 8.9 - 10.3 mg/dL   Total Protein 9.2 (H) 6.5 - 8.1 g/dL   Albumin 3.8 3.5 - 5.0 g/dL   AST 22 15 - 41 U/L   ALT 18 0 - 44 U/L   Alkaline Phosphatase 67 38 - 126 U/L   Total Bilirubin 1.2 0.3 - 1.2 mg/dL   GFR calc non Af Amer 50 (L) >60 mL/min   GFR calc Af Amer 58 (L) >60 mL/min    Comment: (NOTE) The eGFR has been calculated using the CKD EPI equation. This calculation has not been validated in all clinical situations. eGFR's persistently <60 mL/min signify possible Chronic Kidney Disease.    Anion gap 14 5 - 15    Comment: Performed at [facility], [place] [address] [place], [place] [address]  Protime-INR     Status: None   Collection Time: 07/24/18  7:50 PM  Result Value Ref Range   Prothrombin Time 14.0 11.4 - 15.2 seconds   INR 1.09     Comment: Performed at [facility], [place] [address], [place], [place] [address]  Blood culture (routine x 2)     Status: None (Preliminary result)   Collection Time: 07/24/18   7:52 PM  Result Value Ref Range   Specimen Description      BLOOD RIGHT HAND Performed at [place] [address] [place], [place] [address]    Special Requests      BOTTLES DRAWN AEROBIC ONLY Blood Culture adequate volume Performed at [place] [address] [place], [place] [address]    Culture      NO GROWTH < 24 HOURS Performed at [place] [address], [place], [place] [address]    Report Status PENDING   Blood culture (routine x 2)     Status: None (Preliminary result)   Collection Time: 07/24/18  7:52 PM  Result Value Ref Range   Specimen Description      BLOOD LEFT HAND Performed at [facility] [address], [place], [place] [address]    Special Requests      BOTTLES DRAWN AEROBIC ONLY Blood Culture adequate volume Performed at [facility] [address], [place], [place] [address]    Culture      NO GROWTH < 24 HOURS Performed at [place] [place] [address] [place],  [address]    Report Status PENDING   I-stat troponin, ED     Status: Abnormal   Collection Time: 07/24/18  8:02 PM  Result Value Ref Range   Troponin i, poc 0.15 (HH) 0.00 - 0.08 ng/mL   Comment NOTIFIED PHYSICIAN    Comment 3            Comment: Due to the release kinetics of cTnI, a negative result within the first hours of the onset of symptoms does not rule out myocardial infarction with certainty. If myocardial infarction is still suspected, repeat the test at appropriate intervals.   CBG monitoring, ED     Status: None   Collection Time: 07/24/18  8:03 PM  Result Value Ref Range   Glucose-Capillary 82 70 - 99 mg/dL  I-Stat CG4 Lactic Acid, ED     Status: Abnormal   Collection Time: 07/24/18  8:05 PM  Result Value Ref Range   Lactic Acid, Venous 2.59 (HH) 0.5 -  1.9 mmol/L   Comment NOTIFIED PHYSICIAN   CK     Status: None   Collection Time: 07/24/18  8:07 PM  Result  Value Ref Range   Total CK 145 38 - 234 U/L    Comment: Performed at [facility], [place] [address], [place], [place] [address]  I-Stat CG4 Lactic Acid, ED     Status: None   Collection Time: 07/24/18  9:20 PM  Result Value Ref Range   Lactic Acid, Venous 1.55 0.5 - 1.9 mmol/L  Urinalysis, Routine w reflex microscopic     Status: Abnormal   Collection Time: 07/25/18 12:05 AM  Result Value Ref Range   Color, Urine YELLOW YELLOW   APPearance HAZY (A) CLEAR   Specific Gravity, Urine 1.023 1.005 - 1.030   pH 5.0 5.0 - 8.0   Glucose, UA NEGATIVE NEGATIVE mg/dL   Hgb urine dipstick LARGE (A) NEGATIVE   Bilirubin Urine NEGATIVE NEGATIVE   Ketones, ur 20 (A) NEGATIVE mg/dL   Protein, ur 100 (A) NEGATIVE mg/dL   Nitrite NEGATIVE NEGATIVE   Leukocytes, UA MODERATE (A) NEGATIVE   RBC / HPF 21-50 0 - 5 RBC/hpf   WBC, UA 21-50 0 - 5 WBC/hpf   Bacteria, UA MANY (A) NONE SEEN   Squamous Epithelial / LPF 0-5 0 - 5    Comment: Performed at [facility], [place] [address] [place], [place] [address]  Strep pneumoniae urinary antigen     Status: None   Collection Time: 07/25/18 12:05 AM  Result Value Ref Range   Strep Pneumo Urinary Antigen NEGATIVE NEGATIVE    Comment:        Infection due to S. pneumoniae cannot be absolutely ruled out since the antigen present may be below the detection limit of the test. PERFORMED AT [facility] Performed at [facility], [place] [address] [place], [place] [address]   D-dimer, quantitative (not at [facility])     Status: Abnormal   Collection Time: 07/25/18  2:01 AM  Result Value Ref Range   D-Dimer, Quant >20.00 (H) 0.00 - 0.50 ug/mL-FEU    Comment: (NOTE) At the manufacturer cut-off of 0.50 ug/mL FEU, this assay has been documented to exclude PE with a sensitivity and negative predictive value of 97 to 99%.  At this time, this assay has not been approved by the FDA to exclude DVT/VTE. Results should be  correlated with clinical presentation. Performed at [facility], [place] [address] [place], [place] [address]   Troponin I - Now Then Q6H     Status: Abnormal   Collection Time: 07/25/18  2:01 AM  Result Value Ref Range   Troponin I 0.13 (HH) <0.03 ng/mL    Comment: CRITICAL RESULT CALLED TO, READ BACK BY AND VERIFIED WITHHeywood Bene RN [id number] 07/25/18 A NAVARRO Performed at [facility], [place] [address], [place],  [address]   Hemoglobin A1c     Status: Abnormal   Collection Time: 07/25/18  5:28 AM  Result Value Ref Range   Hgb A1c MFr Bld 5.7 (H) 4.8 - 5.6 %    Comment: (NOTE) Pre diabetes:          5.7%-6.4% Diabetes:              >6.4% Glycemic control for   <7.0% adults with diabetes    Mean Plasma Glucose 116.89 mg/dL    Comment: Performed at [place] [place]  [address], [place], [place] [address]  Magnesium     Status: Abnormal   Collection Time: 07/25/18  5:28 AM  Result Value Ref Range   Magnesium 2.6 (H) 1.7 - 2.4 mg/dL    Comment: Performed at [facility], [place] [address], [place], [place] [address]  Phosphorus     Status: Abnormal   Collection Time: 07/25/18  5:28 AM  Result Value Ref Range   Phosphorus 1.9 (L) 2.5 - 4.6 mg/dL    Comment: Performed at [facility], [place] [address], [place], [place] [address]  TSH     Status: None   Collection Time: 07/25/18  5:28 AM  Result Value Ref Range   TSH 0.568 0.350 - 4.500 uIU/mL    Comment: Performed by a 3rd Generation assay with a functional sensitivity of <=0.01 uIU/mL. Performed at [facility], [place] [address], [place], [place] [address]   Comprehensive metabolic panel     Status: Abnormal   Collection Time: 07/25/18  5:28 AM  Result Value Ref Range   Sodium 147 (H) 135 - 145 mmol/L   Potassium 3.9 3.5 - 5.1 mmol/L   Chloride 114 (H) 98 - 111 mmol/L   CO2 21 (L) 22 - 32 mmol/L   Glucose, Bld 128 (H) 70 - 99  mg/dL   BUN 41 (H) 8 - 23 mg/dL   Creatinine, Ser 0.68 0.44 - 1.00 mg/dL   Calcium 9.4 8.9 - 10.3 mg/dL   Total Protein 7.8 6.5 - 8.1 g/dL   Albumin 3.1 (L) 3.5 - 5.0 g/dL   AST 25 15 - 41 U/L   ALT 17 0 - 44 U/L   Alkaline Phosphatase 53 38 - 126 U/L   Total Bilirubin 1.5 (H) 0.3 - 1.2 mg/dL   GFR calc non Af Amer >60 >60 mL/min   GFR calc Af Amer >60 >60 mL/min    Comment: (NOTE) The eGFR has been calculated using the CKD EPI equation. This calculation has not been validated in all clinical situations. eGFR's persistently <60 mL/min signify possible Chronic Kidney Disease.    Anion gap 12 5 - 15    Comment: Performed at [facility], [place] [address] [place], [place] [address]  CBC     Status: Abnormal   Collection Time: 07/25/18  5:28 AM  Result Value Ref Range   WBC 11.1 (H) 4.0 - 10.5 K/uL   RBC 4.71 3.87 - 5.11 MIL/uL   Hemoglobin 14.3 12.0 - 15.0 g/dL   HCT 43.6 36.0 - 46.0 %   MCV 92.6 80.0 - 100.0 fL   MCH 30.4 26.0 - 34.0 pg   MCHC 32.8 30.0 - 36.0 g/dL   RDW 15.3 11.5 - 15.5 %   Platelets 188 150 - 400 K/uL   nRBC 0.0 0.0 - 0.2 %    Comment: Performed at [facility], [place] [address], [place],  [address]  Troponin I - Now Then Q6H     Status: Abnormal   Collection Time: 07/25/18  5:28 AM  Result Value Ref Range   Troponin I 0.10 (HH) <0.03 ng/mL    Comment: CRITICAL VALUE NOTED.  VALUE IS CONSISTENT WITH PREVIOUSLY REPORTED AND CALLED VALUE. Performed at [facility], [place] [address], [place], [place] [address]   Heparin level (unfractionated)     Status: Abnormal   Collection Time: 07/25/18 12:00 PM  Result Value Ref Range   Heparin Unfractionated 0.28 (L) 0.30 - 0.70  IU/mL    Comment: (NOTE) If heparin results are below expected values, and patient dosage has  been confirmed, suggest follow up testing of antithrombin III levels. Performed at [facility], [place] [address], [place], [place] [address]    Ct Head Wo Contrast  Result Date: 07/25/2018 CLINICAL DATA:  Altered mental status EXAM: CT HEAD WITHOUT CONTRAST TECHNIQUE: Contiguous axial images were obtained from the base of the skull through the vertex without intravenous contrast. COMPARISON:  None. FINDINGS: Brain: No subdural, epidural, or subarachnoid hemorrhage. Cerebellum, brainstem, and basal cisterns are normal. Ventricles and sulci are mildly prominent. No acute cortical ischemia or infarct. No mass effect or midline shift. Vascular: Calcified atherosclerosis in the intracranial carotids. Skull: Normal. Negative for fracture or focal lesion. Sinuses/Orbits: No acute finding. Other: None. IMPRESSION: No acute intracranial abnormalities identified. Electronically Signed   By: Dorise Bullion III M.D   On: 07/25/2018 00:53   Ct Angio Chest Pe W Or Wo Contrast  Result Date: 07/25/2018 CLINICAL DATA:  Weakness. Question diverticulitis. Elevated white blood cell count. EXAM: CT ANGIOGRAPHY CHEST CT ABDOMEN AND PELVIS WITH CONTRAST TECHNIQUE: Multidetector CT imaging of the chest was performed using the standard protocol during bolus administration of intravenous contrast. Multiplanar CT image reconstructions and MIPs were obtained to evaluate the vascular anatomy. Multidetector CT imaging of the abdomen and pelvis was performed using the standard protocol during bolus administration of intravenous contrast. CONTRAST:  150m ISOVUE-370 IOPAMIDOL (ISOVUE-370) INJECTION 76% COMPARISON:  Chest x-ray July 24, 2018 FINDINGS: CTA CHEST FINDINGS Cardiovascular: The thoracic aorta demonstrates atherosclerosis with no aneurysm or dissection. The heart size is normal. Evaluation of the pulmonary arteries is limited due to significant stairstep artifact from respiratory motion. Pulmonary emboli are seen in left upper lobe pulmonary arteries such as on series 7, image 140 and coronal images 56 and 53. Evaluation of  right-sided pulmonary arteries is limited due to streak artifact off of the SVC. That being said, there appear to be emboli in the right upper lobe pulmonary arterial branchesa. Significant artifact limits evaluation of the lower lobe branches. Mediastinum/Nodes: The thyroid and esophagus are normal. No adenopathy or effusion. Lungs/Pleura: Infiltrates seen in the right upper lobe, the superior segment of the right lower lobe, and the left lower lobe. Central airways are normal. No pneumothorax. No suspicious nodules or masses. Musculoskeletal: See below. Review of the MIP images confirms the above findings. CT ABDOMEN and PELVIS FINDINGS Hepatobiliary: Probable small hepatic cysts, too small to characterize. No suspicious masses. The portal vein is unremarkable. The gallbladder is unremarkable. Pancreas: Unremarkable. No pancreatic ductal dilatation or surrounding inflammatory changes. Spleen: Normal in size without focal abnormality. Adrenals/Urinary Tract: Adrenal glands are normal. Probable tiny cyst in the right kidney, too small to characterize. No suspicious masses or hydronephrosis. No filling defects in the upper collecting systems on delayed images. No ureteral stones are noted. The bladder is distended but otherwise unremarkable. Stomach/Bowel: The stomach and small bowel are normal. Colonic diverticulosis is seen without diverticulitis. The colon is otherwise normal. Limited views of the appendix are normal. Vascular/Lymphatic: Atherosclerotic changes are seen in the nonaneurysmal aorta. No adenopathy. Reproductive: The uterus and ovaries are normal. Other: No free air or free fluid. Increased attenuation in the fat adjacent to the coccyx as seen on axial image 82. Musculoskeletal: Degenerative changes are seen in the hips bilaterally. No acute bony abnormalities. Review of the MIP images confirms the above findings. IMPRESSION: 1. Pulmonary emboli in the upper lobes.  Evaluation of lower lobe branches is  severely limited but no definitive lower lobe emboli are noted. 2. Multifocal infiltrates, likely pneumonia. Recommend follow-up to resolution. 3. Coronary artery calcifications. Atherosclerotic changes in the aorta. 4. Colonic diverticulosis without diverticulitis. 5. Fat stranding adjacent to the coccyx on axial image 82 is nonspecific. Findings called to Dr. Florina Ou. Electronically Signed   By: Dorise Bullion III M.D   On: 07/25/2018 02:49   Mr Lumbar Spine W Wo Contrast  Result Date: 07/25/2018 CLINICAL DATA:  Generalized weakness for 3 days EXAM: MRI LUMBAR SPINE WITHOUT AND WITH CONTRAST TECHNIQUE: Multiplanar and multiecho pulse sequences of the lumbar spine were obtained without and with intravenous contrast. CONTRAST:  7 cc Gadavist intravenous COMPARISON:  None. FINDINGS: Segmentation:  5 lumbar type vertebral bodies Alignment:  Normal Vertebrae: No fracture, evidence of discitis, or aggressive bone lesion. Presumed bone island in the L1 body. Sacral meningeal cyst with prominent scalloping at the level of the S2 and S3 bodies, with imperceptible cortex at some levels. Conus medullaris and cauda equina: Conus extends to the L1-2 level. Conus and cauda equina appear normal. Specifically the nerve roots are not thickened or enhancing for Guillain-Barre. There is dependent lobulated material within the thecal sac at L5 and below, which is nonenhancing. Paraspinal and other soft tissues: Full urinary bladder. Sigmoid diverticulosis. Disc levels: Good disc height and hydration for age. Mild generalized facet spurring. No impingement. IMPRESSION: 1. No definitive cause of weakness. The cauda equina conus has a normal appearance and there is no compressive stenosis. 2. Unusual material in the lower and dependent thecal sac which could be debris or scarring related to notably expansile sacral Tarlov cysts. Please correlate for trauma history or meningitis symptoms. Lumbar puncture may be helpful, although  complicated by acute PE. 3. History of incontinence. There is moderate distension of the urinary bladder-consider checking postvoid residual. Electronically Signed   By: Monte Fantasia M.D.   On: 07/25/2018 07:13   Ct Abdomen Pelvis W Contrast  Result Date: 07/25/2018 CLINICAL DATA:  Weakness. Question diverticulitis. Elevated white blood cell count. EXAM: CT ANGIOGRAPHY CHEST CT ABDOMEN AND PELVIS WITH CONTRAST TECHNIQUE: Multidetector CT imaging of the chest was performed using the standard protocol during bolus administration of intravenous contrast. Multiplanar CT image reconstructions and MIPs were obtained to evaluate the vascular anatomy. Multidetector CT imaging of the abdomen and pelvis was performed using the standard protocol during bolus administration of intravenous contrast. CONTRAST:  139m ISOVUE-370 IOPAMIDOL (ISOVUE-370) INJECTION 76% COMPARISON:  Chest x-ray July 24, 2018 FINDINGS: CTA CHEST FINDINGS Cardiovascular: The thoracic aorta demonstrates atherosclerosis with no aneurysm or dissection. The heart size is normal. Evaluation of the pulmonary arteries is limited due to significant stairstep artifact from respiratory motion. Pulmonary emboli are seen in left upper lobe pulmonary arteries such as on series 7, image 140 and coronal images 56 and 53. Evaluation of right-sided pulmonary arteries is limited due to streak artifact off of the SVC. That being said, there appear to be emboli in the right upper lobe pulmonary arterial branchesa. Significant artifact limits evaluation of the lower lobe branches. Mediastinum/Nodes: The thyroid and esophagus are normal. No adenopathy or effusion. Lungs/Pleura: Infiltrates seen in the right upper lobe, the superior segment of the right lower lobe, and the left lower lobe. Central airways are normal. No pneumothorax. No suspicious nodules or masses. Musculoskeletal: See below. Review of the MIP images confirms the above findings. CT ABDOMEN and  PELVIS FINDINGS Hepatobiliary: Probable small hepatic cysts,  too small to characterize. No suspicious masses. The portal vein is unremarkable. The gallbladder is unremarkable. Pancreas: Unremarkable. No pancreatic ductal dilatation or surrounding inflammatory changes. Spleen: Normal in size without focal abnormality. Adrenals/Urinary Tract: Adrenal glands are normal. Probable tiny cyst in the right kidney, too small to characterize. No suspicious masses or hydronephrosis. No filling defects in the upper collecting systems on delayed images. No ureteral stones are noted. The bladder is distended but otherwise unremarkable. Stomach/Bowel: The stomach and small bowel are normal. Colonic diverticulosis is seen without diverticulitis. The colon is otherwise normal. Limited views of the appendix are normal. Vascular/Lymphatic: Atherosclerotic changes are seen in the nonaneurysmal aorta. No adenopathy. Reproductive: The uterus and ovaries are normal. Other: No free air or free fluid. Increased attenuation in the fat adjacent to the coccyx as seen on axial image 82. Musculoskeletal: Degenerative changes are seen in the hips bilaterally. No acute bony abnormalities. Review of the MIP images confirms the above findings. IMPRESSION: 1. Pulmonary emboli in the upper lobes. Evaluation of lower lobe branches is severely limited but no definitive lower lobe emboli are noted. 2. Multifocal infiltrates, likely pneumonia. Recommend follow-up to resolution. 3. Coronary artery calcifications. Atherosclerotic changes in the aorta. 4. Colonic diverticulosis without diverticulitis. 5. Fat stranding adjacent to the coccyx on axial image 82 is nonspecific. Findings called to Dr. Florina Ou. Electronically Signed   By: Dorise Bullion III M.D   On: 07/25/2018 02:49   Dg Chest Port 1 View  Result Date: 07/24/2018 CLINICAL DATA:  Weakness EXAM: PORTABLE CHEST 1 VIEW COMPARISON:  11/21/2012 FINDINGS: New elevation of the right hemidiaphragm.  Accentuated fullness in the AP window region compared to prior. Atherosclerotic calcification of the aortic arch. IMPRESSION: 1. New elevation of the right hemidiaphragm. 2. Accentuated fullness in the AP window region compared to prior, cannot exclude adenopathy or mass. Consider chest CT for further evaluation. Electronically Signed   By: Van Clines M.D.   On: 07/24/2018 20:34   Vas [place] Lower Extremity Venous (dvt)  Result Date: 07/25/2018  Lower Venous Study Indications: SOB, and pulmonary embolism.  Risk Factors: Confirmed PE. Performing Technologist: Toma Copier RVS  Examination Guidelines: A complete evaluation includes B-mode imaging, spectral Doppler, color Doppler, and power Doppler as needed of all accessible portions of each vessel. Bilateral testing is considered an integral part of a complete examination. Limited examinations for reoccurring indications may be performed as noted.  Right Venous Findings: +---------+---------------+---------+-----------+----------+------------------+          CompressibilityPhasicitySpontaneityPropertiesSummary            +---------+---------------+---------+-----------+----------+------------------+ CFV      Full           Yes      Yes                                     +---------+---------------+---------+-----------+----------+------------------+ SFJ      Full                                                            +---------+---------------+---------+-----------+----------+------------------+ FV Prox  Full           Yes      Yes                                     +---------+---------------+---------+-----------+----------+------------------+  FV Mid   Full                                                            +---------+---------------+---------+-----------+----------+------------------+ FV DistalFull           Yes      Yes                                      +---------+---------------+---------+-----------+----------+------------------+ PFV      Full           Yes      Yes                                     +---------+---------------+---------+-----------+----------+------------------+ POP      Partial        Yes      Yes                                     +---------+---------------+---------+-----------+----------+------------------+ PTV      Partial                                      Acute mid to                                                             proximal           +---------+---------------+---------+-----------+----------+------------------+ PERO                                                  Difficult to                                                             visualize          +---------+---------------+---------+-----------+----------+------------------+ Gastroc  Partial                                      Acute              +---------+---------------+---------+-----------+----------+------------------+  Right Technical Findings: Difficult to position the leg for distal imaging due to dementia and patient poor to repond.  Left Venous Findings: +---------+---------------+---------+-----------+----------+-------------+          CompressibilityPhasicitySpontaneityPropertiesSummary       +---------+---------------+---------+-----------+----------+-------------+ CFV      Full           Yes  Yes                                +---------+---------------+---------+-----------+----------+-------------+ SFJ      Full                                                       +---------+---------------+---------+-----------+----------+-------------+ FV Prox  Full           Yes      Yes                                +---------+---------------+---------+-----------+----------+-------------+ FV Mid   Full                                                        +---------+---------------+---------+-----------+----------+-------------+ FV DistalFull           Yes      Yes                                +---------+---------------+---------+-----------+----------+-------------+ PFV      Full           Yes      Yes                                +---------+---------------+---------+-----------+----------+-------------+ POP      Partial                                      Mid to distal +---------+---------------+---------+-----------+----------+-------------+ PTV      None                                                       +---------+---------------+---------+-----------+----------+-------------+ PERO     None                                                       +---------+---------------+---------+-----------+----------+-------------+  Left Technical Findings: Difficult to positin the lef for distal imaging due to dementia and patient poorto respond. Rouleux flow was noted in the proxiaml popliteal vein.   Summary: Right: Findings consistent with acute deep vein thrombosis involving the right gastrocnemius vein, right posterior tibial vein, and right popliteal vein. See technical findings listed above Left: Findings consistent with acute deep vein thrombosis involving the left peroneal vein, left posterior tibial vein, and left popliteal vein. See technical findings listed above  *See table(s) above for measurements and observations. Electronically signed by Monica Martinez MD on 07/25/2018 at 3:36:49 PM.    Final    EKG Interpretation  Date/Time:  Wednesday July 24 2018 20:03:18 EST Ventricular Rate:  100 PR Interval:    QRS Duration: 83 QT Interval:  348 QTC Calculation: 449 R Axis:   -38 Text Interpretation:  Sinus tachycardia Biatrial enlargement Left ventricular hypertrophy Confirmed by Dene Gentry [phone]) on 07/24/2018 8:06:02 PM Also confirmed by Dene Gentry [phone]), editor Philomena Doheny [phone])  on  07/25/2018 7:11:47 AM   Pending Labs Unresulted Labs (From admission, onward)    Start     Ordered   07/26/18 0500  CBC  Daily,   R    Comments:  While on heparin drip    07/25/18 0357   07/25/18 2100  Heparin level (unfractionated)  Once-Timed,   R     07/25/18 1245   07/25/18 0321  Expectorated sputum assessment w rflx to resp cult  Once,   R     07/25/18 0322   07/25/18 0134  Troponin I - Now Then Q6H  Now then every 6 hours,   R     07/25/18 0133   07/24/18 1915  Urine culture  ONCE - STAT,   STAT     07/24/18 1915          Vitals/Pain Today's Vitals   07/25/18 1330 07/25/18 1349 07/25/18 1430 07/25/18 1500  BP: (!) 166/76  (!) 149/91 (!) 158/95  Pulse:  95 80 93  Resp: (!) 22 (!) 24  (!) 28  Temp:      TempSrc:      SpO2:  99% 99% 98%  Weight:      Height:      PainSc:        Isolation Precautions No active isolations  Medications Medications  acetaminophen (TYLENOL) tablet 650 mg (has no administration in time range)    Or  acetaminophen (TYLENOL) suppository 650 mg (has no administration in time range)  ondansetron (ZOFRAN) tablet 4 mg (has no administration in time range)    Or  ondansetron (ZOFRAN) injection 4 mg (has no administration in time range)  sodium chloride (PF) 0.9 % injection (has no administration in time range)  iopamidol (ISOVUE-370) 76 % injection (has no administration in time range)  cefTRIAXone (ROCEPHIN) 1 g in sodium chloride 0.9 % 100 mL IVPB (0 g Intravenous Stopped 07/25/18 0350)  azithromycin (ZITHROMAX) 500 mg in sodium chloride 0.9 % 250 mL IVPB (0 mg Intravenous Stopped 07/25/18 0521)  dextrose 5 % solution ( Intravenous New Bag/Given 07/25/18 1203)  potassium phosphate (monobasic) (K-PHOS ORIGINAL) tablet 500 mg (500 mg Oral Not Given 07/25/18 1349)  heparin ADULT infusion 100 units/mL (25000 units/255m sodium chloride 0.45%) (1,200 Units/hr Intravenous Rate/Dose Change 07/25/18 1324)  sodium chloride 0.9 % bolus 1,000 mL  (0 mLs Intravenous Stopped 07/24/18 2116)  0.9 %  sodium chloride infusion ( Intravenous Stopped 07/25/18 0310)  iopamidol (ISOVUE-370) 76 % injection 100 mL (100 mLs Intravenous Contrast Given 07/25/18 0203)  heparin bolus via infusion 2,000 Units (2,000 Units Intravenous Bolus from Bag 07/25/18 0436)  gadobutrol (GADAVIST) 1 MMOL/ML injection 7 mL (7 mLs Intravenous Contrast Given 07/25/18 0643)    Mobility non-ambulatory currently

## 2018-07-25 NOTE — Significant Event (Signed)
Rapid Response Event Note  Overview:  Called to MRI for pt who has had a brain bleed. Pt out of the scanner, Initial Focused Assessment: Pt is nonverbal, noted moving lt arm nonpurposefully. Rt pupil is smaller than left and appears slightly deviated to the right, questionable reaction to light.  IV site left AC has mod amt bloody dng. BP 185/92 HR 88 R 28,    Interventions: Pt  Urgently transferred to rm 1224 for critical care Event Summary:  Pt remains hypertensive, MD at bedside, will consult critical care, have [facility]ub[facility]   at      at          Corning[place]illon, Zannie KehrStephanie S

## 2018-07-25 NOTE — ED Notes (Signed)
Hospitialist paged to notify them of pts increasing restlessness and agitation. Pt pulling at IV tubing and wires. Per MD: Place pt in mitten restraints if needed. MD to order safety sitter.

## 2018-07-25 NOTE — Progress Notes (Signed)
ANTICOAGULATION CONSULT NOTE - Initial Consult  Pharmacy Consult for heparin Indication: pulmonary embolus  No Known Allergies  Patient Measurements: Height: 5\' 6"  (167.6 cm) Weight: 160 lb (72.6 kg) IBW/kg (Calculated) : 59.3 Heparin Dosing Weight: 72.6 kg  Vital Signs: Temp: 97.7 F (36.5 C) (11/20 1918) Temp Source: Oral (11/20 1918) BP: 145/81 (11/21 0330) Pulse Rate: 88 (11/21 0330)  Labs: Recent Labs    07/24/18 1950 07/24/18 2007 07/25/18 0201  HGB 17.4*  --   --   HCT 54.5*  --   --   PLT 239  --   --   LABPROT 14.0  --   --   INR 1.09  --   --   CREATININE 1.06*  --   --   CKTOTAL  --  145  --   TROPONINI  --   --  0.13*    Estimated Creatinine Clearance: 47.5 mL/min (A) (by C-G formula based on SCr of 1.06 mg/dL (H)).   Medical History: History reviewed. No pertinent past medical history.  Assessment: 72 yo F with PE.   CBC WNL.  CT angio + for PE in upper lobes, multiple infiltrates, likely PNA.   Goal of Therapy:  INR 2-3 Monitor platelets by anticoagulation protocol: Yes   Plan:  Give 2000 units bolus x 1 Start heparin infusion at 1050 units/hr Check anti-Xa level in 8 hours and daily while on heparin Continue to monitor H&H and platelets  Herby AbrahamMichelle T. Saige Canton, Pharm.D [phone] 07/25/2018 3:55 AM

## 2018-07-25 NOTE — ED Notes (Signed)
Hospitialist at bedside.  

## 2018-07-25 NOTE — Progress Notes (Signed)
PROGRESS NOTE    Hannah Holloway  ZOX:[id number] DOB: 03/28/1946 DOA: 07/24/2018 PCP: Patient, No Pcp Per    Brief Narrative; 72 year old with past medical history significant for dementia, ( to the emergency department with fatigue, worsening mental status, more confusion and lethargic. Patient lives with sister, who left town 4 days prior to this admission. Patient was found by a friend, more confused. Patient apparently has not been eating.   Evaluation in the ED department was consistent with lactic acidosis, with a lactic acid level at 2.5, troponin at 0.15,B UN at 63 creatinine 1.0, hemoglobin at 17, d-dimer more than 20.  Patient was found to have pulmonary embolism in the upper lobes, and multifocal pneumonia. A she was started on IV heparin.   Assessment & Plan:   Active Problems:   Elevated troponin   Acute metabolic encephalopathy   Dementia (HCC)   Dehydration   UTI (urinary tract infection)   Pulmonary emboli (HCC)   Multifocal pneumonia   1-Acute metabolic encephalopathy; Patient presented with dehydration, hypernatremia, metabolic acidosis. Patient also with multifocal pneumonia. Will get an MRI to rule out a stroke.  Pulmonary embolism; Mild elevation of troponin. No hypotension. Continue with IV heparin. Dopplers of lower extremities. Echo pending  Hypernatremia; start D5 IV fluids. Multifocal pneumonia; continue with IV ceftriaxone and Azithromycin.   Mild elevation in troponin; the setting of PE continue with IV heparin. Lower extremity edema; Dopplers of lower extremity order. Hypophosphatemia; replete orally  Lower extremity weakness MRI; No definitive cause of weakness. The cauda equina conus has a normal appearance and there is no compressive stenosis. Unusual material in the lower and dependent thecal sac which could be debris or scarring related to notably expansile sacral Tarlov cysts. Please correlate for trauma history or meningitis symptoms.  Lumbar puncture may be helpful, although complicated by acute PE.  UTI; follow urine culture.  On IV ceftriaxone.   AKI;  Continue with IV fluids.    RN Pressure Injury Documentation:    Malnutrition Type:      Malnutrition Characteristics:      Nutrition Interventions:     Estimated body mass index is 25.82 kg/m as calculated from the following:   Height as of this encounter: 5\' 6"  (1.676 m).   Weight as of this encounter: 72.6 kg.   DVT prophylaxis: heparin drip Code Status: . Full code Family Communication: no family at bedside Disposition Plan: remain in the hospital for IV heparin treatment of acute PE and elevation of troponins   Consultants:   none   Procedures:   ECHO pendng  Doppler LE pending.   Antimicrobials: IV ceftriaxone IV Azithromycin.   Subjective: Alert, non conversant, aphasic  Objective: Vitals:   07/25/18 0430 07/25/18 0500 07/25/18 0530 07/25/18 0600  BP: (!) 156/86 (!) 160/103 (!) 168/99 (!) 170/93  Pulse: 91 88 82 80  Resp: (!) 23 (!) 25 (!) 21 (!) 21  Temp:      TempSrc:      SpO2: 97% 98% 97% 96%  Weight:      Height:       No intake or output data in the 24 hours ending 07/25/18 0756 Filed Weights   07/24/18 1918  Weight: 72.6 kg    Examination:  General exam: Appears calm and comfortable  Respiratory system: mild tachypnea.  Cardiovascular system: S1 & S2 heard, RRR. No JVD, murmurs, rubs, gallops or clicks. No pedal edema. Gastrointestinal system: Abdomen is nondistended, soft and nontender. No organomegaly or masses  felt. Normal bowel sounds heard. Central nervous system: Alert , aphasic, non conversant  Extremities: edema BL Skin: No rashes, lesions or ulcers Psychiatry: unable to assess    Data Reviewed: I have personally reviewed following labs and imaging studies  CBC: Recent Labs  Lab 07/24/18 1950 07/25/18 0528  WBC 10.1 11.1*  NEUTROABS 8.7*  --   HGB 17.4* 14.3  HCT 54.5* 43.6    MCV 92.7 92.6  PLT 239 188   Basic Metabolic Panel: Recent Labs  Lab 07/24/18 1950 07/25/18 0528  NA 143 147*  K 3.9 3.9  CL 107 114*  CO2 22 21*  GLUCOSE 132* 128*  BUN 63* 41*  CREATININE 1.06* 0.68  CALCIUM 9.8 9.4  MG  --  2.6*  PHOS  --  1.9*   GFR: Estimated Creatinine Clearance: 62.9 mL/min (by C-G formula based on SCr of 0.68 mg/dL). Liver Function Tests: Recent Labs  Lab 07/24/18 1950 07/25/18 0528  AST 22 25  ALT 18 17  ALKPHOS 67 53  BILITOT 1.2 1.5*  PROT 9.2* 7.8  ALBUMIN 3.8 3.1*   No results for input(s): LIPASE, AMYLASE in the last 168 hours. No results for input(s): AMMONIA in the last 168 hours. Coagulation Profile: Recent Labs  Lab 07/24/18 1950  INR 1.09   Cardiac Enzymes: Recent Labs  Lab 07/24/18 2007 07/25/18 0201 07/25/18 0528  CKTOTAL 145  --   --   TROPONINI  --  0.13* 0.10*   BNP (last 3 results) No results for input(s): PROBNP in the last 8760 hours. HbA1C: No results for input(s): HGBA1C in the last 72 hours. CBG: Recent Labs  Lab 07/24/18 2003  GLUCAP 82   Lipid Profile: No results for input(s): CHOL, HDL, LDLCALC, TRIG, CHOLHDL, LDLDIRECT in the last 72 hours. Thyroid Function Tests: Recent Labs    07/25/18 0528  TSH 0.568   Anemia Panel: No results for input(s): VITAMINB12, FOLATE, FERRITIN, TIBC, IRON, RETICCTPCT in the last 72 hours. Sepsis Labs: Recent Labs  Lab 07/24/18 2005 07/24/18 2120  LATICACIDVEN 2.59* 1.55    No results found for this or any previous visit (from the past 240 hour(s)).       Radiology Studies: Ct Head Wo Contrast  Result Date: 07/25/2018 CLINICAL DATA:  Altered mental status EXAM: CT HEAD WITHOUT CONTRAST TECHNIQUE: Contiguous axial images were obtained from the base of the skull through the vertex without intravenous contrast. COMPARISON:  None. FINDINGS: Brain: No subdural, epidural, or subarachnoid hemorrhage. Cerebellum, brainstem, and basal cisterns are normal.  Ventricles and sulci are mildly prominent. No acute cortical ischemia or infarct. No mass effect or midline shift. Vascular: Calcified atherosclerosis in the intracranial carotids. Skull: Normal. Negative for fracture or focal lesion. Sinuses/Orbits: No acute finding. Other: None. IMPRESSION: No acute intracranial abnormalities identified. Electronically Signed   By: Gerome Sam III M.D   On: 07/25/2018 00:53   Ct Angio Chest Pe W Or Wo Contrast  Result Date: 07/25/2018 CLINICAL DATA:  Weakness. Question diverticulitis. Elevated white blood cell count. EXAM: CT ANGIOGRAPHY CHEST CT ABDOMEN AND PELVIS WITH CONTRAST TECHNIQUE: Multidetector CT imaging of the chest was performed using the standard protocol during bolus administration of intravenous contrast. Multiplanar CT image reconstructions and MIPs were obtained to evaluate the vascular anatomy. Multidetector CT imaging of the abdomen and pelvis was performed using the standard protocol during bolus administration of intravenous contrast. CONTRAST:  ISOVUE-370 IOPAMIDOL (ISOVUE-370) INJECTION 76% COMPARISON:  Chest x-ray July 24, 2018 FINDINGS: CTA CHEST  FINDINGS Cardiovascular: The thoracic aorta demonstrates atherosclerosis with no aneurysm or dissection. The heart size is normal. Evaluation of the pulmonary arteries is limited due to significant stairstep artifact from respiratory motion. Pulmonary emboli are seen in left upper lobe pulmonary arteries such as on series 7, image 140 and coronal images 56 and 53. Evaluation of right-sided pulmonary arteries is limited due to streak artifact off of the SVC. That being said, there appear to be emboli in the right upper lobe pulmonary arterial branchesa. Significant artifact limits evaluation of the lower lobe branches. Mediastinum/Nodes: The thyroid and esophagus are normal. No adenopathy or effusion. Lungs/Pleura: Infiltrates seen in the right upper lobe, the superior segment of the right lower  lobe, and the left lower lobe. Central airways are normal. No pneumothorax. No suspicious nodules or masses. Musculoskeletal: See below. Review of the MIP images confirms the above findings. CT ABDOMEN and PELVIS FINDINGS Hepatobiliary: Probable small hepatic cysts, too small to characterize. No suspicious masses. The portal vein is unremarkable. The gallbladder is unremarkable. Pancreas: Unremarkable. No pancreatic ductal dilatation or surrounding inflammatory changes. Spleen: Normal in size without focal abnormality. Adrenals/Urinary Tract: Adrenal glands are normal. Probable tiny cyst in the right kidney, too small to characterize. No suspicious masses or hydronephrosis. No filling defects in the upper collecting systems on delayed images. No ureteral stones are noted. The bladder is distended but otherwise unremarkable. Stomach/Bowel: The stomach and small bowel are normal. Colonic diverticulosis is seen without diverticulitis. The colon is otherwise normal. Limited views of the appendix are normal. Vascular/Lymphatic: Atherosclerotic changes are seen in the nonaneurysmal aorta. No adenopathy. Reproductive: The uterus and ovaries are normal. Other: No free air or free fluid. Increased attenuation in the fat adjacent to the coccyx as seen on axial image 82. Musculoskeletal: Degenerative changes are seen in the hips bilaterally. No acute bony abnormalities. Review of the MIP images confirms the above findings. IMPRESSION: 1. Pulmonary emboli in the upper lobes. Evaluation of lower lobe branches is severely limited but no definitive lower lobe emboli are noted. 2. Multifocal infiltrates, likely pneumonia. Recommend follow-up to resolution. 3. Coronary artery calcifications. Atherosclerotic changes in the aorta. 4. Colonic diverticulosis without diverticulitis. 5. Fat stranding adjacent to the coccyx on axial image 82 is nonspecific. Findings called to Dr. Read Drivers. Electronically Signed   By: Gerome Sam III M.D    On: 07/25/2018 02:49   Mr Lumbar Spine W Wo Contrast  Result Date: 07/25/2018 CLINICAL DATA:  Generalized weakness for 3 days EXAM: MRI LUMBAR SPINE WITHOUT AND WITH CONTRAST TECHNIQUE: Multiplanar and multiecho pulse sequences of the lumbar spine were obtained without and with intravenous contrast. CONTRAST:  7 cc Gadavist intravenous COMPARISON:  None. FINDINGS: Segmentation:  5 lumbar type vertebral bodies Alignment:  Normal Vertebrae: No fracture, evidence of discitis, or aggressive bone lesion. Presumed bone island in the L1 body. Sacral meningeal cyst with prominent scalloping at the level of the S2 and S3 bodies, with imperceptible cortex at some levels. Conus medullaris and cauda equina: Conus extends to the L1-2 level. Conus and cauda equina appear normal. Specifically the nerve roots are not thickened or enhancing for Guillain-Barre. There is dependent lobulated material within the thecal sac at L5 and below, which is nonenhancing. Paraspinal and other soft tissues: Full urinary bladder. Sigmoid diverticulosis. Disc levels: Good disc height and hydration for age. Mild generalized facet spurring. No impingement. IMPRESSION: 1. No definitive cause of weakness. The cauda equina conus has a normal appearance and there is no compressive  stenosis. 2. Unusual material in the lower and dependent thecal sac which could be debris or scarring related to notably expansile sacral Tarlov cysts. Please correlate for trauma history or meningitis symptoms. Lumbar puncture may be helpful, although complicated by acute PE. 3. History of incontinence. There is moderate distension of the urinary bladder-consider checking postvoid residual. Electronically Signed   By: Marnee Spring M.D.   On: 07/25/2018 07:13   Ct Abdomen Pelvis W Contrast  Result Date: 07/25/2018 CLINICAL DATA:  Weakness. Question diverticulitis. Elevated white blood cell count. EXAM: CT ANGIOGRAPHY CHEST CT ABDOMEN AND PELVIS WITH CONTRAST  TECHNIQUE: Multidetector CT imaging of the chest was performed using the standard protocol during bolus administration of intravenous contrast. Multiplanar CT image reconstructions and MIPs were obtained to evaluate the vascular anatomy. Multidetector CT imaging of the abdomen and pelvis was performed using the standard protocol during bolus administration of intravenous contrast. CONTRAST:  ISOVUE-370 IOPAMIDOL (ISOVUE-370) INJECTION 76% COMPARISON:  Chest x-ray July 24, 2018 FINDINGS: CTA CHEST FINDINGS Cardiovascular: The thoracic aorta demonstrates atherosclerosis with no aneurysm or dissection. The heart size is normal. Evaluation of the pulmonary arteries is limited due to significant stairstep artifact from respiratory motion. Pulmonary emboli are seen in left upper lobe pulmonary arteries such as on series 7, image 140 and coronal images 56 and 53. Evaluation of right-sided pulmonary arteries is limited due to streak artifact off of the SVC. That being said, there appear to be emboli in the right upper lobe pulmonary arterial branchesa. Significant artifact limits evaluation of the lower lobe branches. Mediastinum/Nodes: The thyroid and esophagus are normal. No adenopathy or effusion. Lungs/Pleura: Infiltrates seen in the right upper lobe, the superior segment of the right lower lobe, and the left lower lobe. Central airways are normal. No pneumothorax. No suspicious nodules or masses. Musculoskeletal: See below. Review of the MIP images confirms the above findings. CT ABDOMEN and PELVIS FINDINGS Hepatobiliary: Probable small hepatic cysts, too small to characterize. No suspicious masses. The portal vein is unremarkable. The gallbladder is unremarkable. Pancreas: Unremarkable. No pancreatic ductal dilatation or surrounding inflammatory changes. Spleen: Normal in size without focal abnormality. Adrenals/Urinary Tract: Adrenal glands are normal. Probable tiny cyst in the right kidney, too small to  characterize. No suspicious masses or hydronephrosis. No filling defects in the upper collecting systems on delayed images. No ureteral stones are noted. The bladder is distended but otherwise unremarkable. Stomach/Bowel: The stomach and small bowel are normal. Colonic diverticulosis is seen without diverticulitis. The colon is otherwise normal. Limited views of the appendix are normal. Vascular/Lymphatic: Atherosclerotic changes are seen in the nonaneurysmal aorta. No adenopathy. Reproductive: The uterus and ovaries are normal. Other: No free air or free fluid. Increased attenuation in the fat adjacent to the coccyx as seen on axial image 82. Musculoskeletal: Degenerative changes are seen in the hips bilaterally. No acute bony abnormalities. Review of the MIP images confirms the above findings. IMPRESSION: 1. Pulmonary emboli in the upper lobes. Evaluation of lower lobe branches is severely limited but no definitive lower lobe emboli are noted. 2. Multifocal infiltrates, likely pneumonia. Recommend follow-up to resolution. 3. Coronary artery calcifications. Atherosclerotic changes in the aorta. 4. Colonic diverticulosis without diverticulitis. 5. Fat stranding adjacent to the coccyx on axial image 82 is nonspecific. Findings called to Dr. Read Drivers. Electronically Signed   By: Gerome Sam III M.D   On: 07/25/2018 02:49   Dg Chest Port 1 View  Result Date: 07/24/2018 CLINICAL DATA:  Weakness EXAM: PORTABLE CHEST 1  VIEW COMPARISON:  11/21/2012 FINDINGS: New elevation of the right hemidiaphragm. Accentuated fullness in the AP window region compared to prior. Atherosclerotic calcification of the aortic arch. IMPRESSION: 1. New elevation of the right hemidiaphragm. 2. Accentuated fullness in the AP window region compared to prior, cannot exclude adenopathy or mass. Consider chest CT for further evaluation. Electronically Signed   By: Gaylyn RongWalter  Liebkemann M.D.   On: 07/24/2018 20:34        Scheduled Meds: .  iopamidol      . sodium chloride (PF)       Continuous Infusions: . sodium chloride Stopped (07/25/18 0612)  . azithromycin Stopped (07/25/18 0521)  . cefTRIAXone (ROCEPHIN)  IV Stopped (07/25/18 0350)  . heparin Stopped (07/25/18 0612)     LOS: 0 days    Time spent: 35 minutes    Alba CoryBelkys A Elie Gragert, MD [facility] Pager [phone]  If 7PM-7AM, please contact night-coverage [email or web address] Password [email or web address] 07/25/2018, 7:56 AM

## 2018-07-25 NOTE — Progress Notes (Signed)
Pt brought down to MRI by Laymond PurserKatie RN. Pt moved over to mri table and Heparin drip was moved over to MRI compatible pump. After scout images were ran I called Neuro Rad Dr Alfredo BattyMattern to have him look at the  Images. The DWI sequence had then been ran. Upon discussion with him he informed to to run a limited brain scan and remove her from the scanner. I expressed my concern to him about the patient being on blood thinners on a pump while in the scanner and in which he told me to DC the Heparin. RN was immediately called as well as Rapid response RN due to pt's decrease in awareness while pt was in the MRI scanner. Pt was then taken to ICU from the MRI department.

## 2018-07-25 NOTE — ED Notes (Signed)
Patient transported to CT 

## 2018-07-25 NOTE — ED Notes (Signed)
Pt arrives back from MRI. Pts bedding is soiled.Pts bedding changed, pt repositioned in bed. NAD noted at this time.

## 2018-07-25 NOTE — ED Notes (Signed)
Pt placed in mitten restraints due to pulling at tubing and wires.

## 2018-07-25 NOTE — ED Notes (Signed)
Patient transported to MRI 

## 2018-07-25 NOTE — Progress Notes (Signed)
Bilateral lower extremity venous duplex completed - Preliminary results - Technically difficult due to patient being unable to fully cooperate for optimal imaging positions. Right - Positive for acute DVT of the popliteal and mid to proximal posterior tibial veins. Unable to adequately evaluate the peroneal veins. Left - Positive for an acute DVT of the popliteal, peroneal, and posterior tibial veins. There is no evidence of a Baker's cyst bilaterally.   Hannah Holloway,RVS 07/25/2018 9:06 AM

## 2018-07-25 NOTE — Progress Notes (Signed)
While in MRI pt found to have a possible brain bleed.  Rapid response and hospitalist notified.  Pt transported to ICU from MRI.  PT brother Hannah Holloway made aware of change in status.

## 2018-07-25 NOTE — ED Notes (Signed)
Date and time results received: 07/25/18 0258 (use smartphrase ".now" to insert current time)  Test: Troponin Critical Value: 0.13  Name of Provider Notified: [place]us, MD  Orders Received? Or Actions Taken?: Orders Received - See Orders for details

## 2018-07-25 NOTE — ED Notes (Signed)
775 mL using bladder scanner

## 2018-07-25 NOTE — Progress Notes (Addendum)
I was called by radiology at 6;40 with report of MRI. Patient has left frontal lobe and parietal lobe hemorrhage. Midline shift.  Per nurse patient more lethargic.  -Dr. Dahlia BailiffHugelmyer from my service went to see patient.  -I have consulted neurology Dr Rozelle LoganAror. He recommend transfer and start with nicardipine Gtt.  -Patient will be intubated for airway protection.  -IV heparin Stop.  -Dr Venetia MaxonStern consulted, he is recommending transfer to [facility], CCM and neurology consulted for  medical management.  -I have also consulted CCM, Dr. Warrick Parisiangan.  -I spoke with Brother Dr Myrtie Neitherarter Arthur,  He wants to proceed with care. He would like Dr Alphonzo Lemmingsabbel to give second opinion. He will like CCM to cal his brother Zola Buttonaul Carter.  -Informed CCM that patient has also bilateral DVT.   [name], MD.

## 2018-07-25 NOTE — ED Notes (Signed)
MRI at bedside to take pt. 

## 2018-07-25 NOTE — Progress Notes (Signed)
  Echocardiogram 2D Echocardiogram has been performed.  Hannah Holloway 07/25/2018, 12:02 PM

## 2018-07-25 NOTE — ED Notes (Signed)
Echo at bedside

## 2018-07-25 NOTE — Progress Notes (Signed)
ANTICOAGULATION CONSULT NOTE Pharmacy Consult for heparin Indication: pulmonary embolus  No Known Allergies  Patient Measurements: Height: 5\' 6"  (167.6 cm) Weight: 160 lb (72.6 kg) IBW/kg (Calculated) : 59.3 Heparin Dosing Weight: 72.6 kg  Vital Signs: BP: 170/93 (11/21 1141) Pulse Rate: 100 (11/21 1141)  Labs: Recent Labs    07/24/18 1950 07/24/18 2007 07/25/18 0201 07/25/18 0528 07/25/18 1200  HGB 17.4*  --   --  14.3  --   HCT 54.5*  --   --  43.6  --   PLT 239  --   --  188  --   LABPROT 14.0  --   --   --   --   INR 1.09  --   --   --   --   HEPARINUNFRC  --   --   --   --  0.28*  CREATININE 1.06*  --   --  0.68  --   CKTOTAL  --  145  --   --   --   TROPONINI  --   --  0.13* 0.10*  --     Estimated Creatinine Clearance: 62.9 mL/min (by C-G formula based on SCr of 0.68 mg/dL).   Medical History: History reviewed. No pertinent past medical history.  Assessment: 72 yo F with PE.   CBC WNL.  CT angio + for PE in upper lobes, multiple infiltrates, likely PNA.   Goal of Therapy:  INR 2-3 Monitor platelets by anticoagulation protocol: Yes   Plan:  Give 2000 units bolus x 1 Start heparin infusion at 1050 units/hr Check anti-Xa level in 8 hours and daily while on heparin Continue to monitor H&H and platelets  Assessment: [age] yoF with new PE and possible overlying PNA. Pharmacy to dose heparin. D-dimer and troponin elevated; CTA shows multiple small emboli in bilateral upper lobes. Pharmacy to dose heparin infusion.   Baseline INR WNL, aPTT not done  Prior anticoagulation: none  Significant events:  Today, 07/25/2018:  HR normal and BP on higher side; satting 98% on RA; not complaining of any symptoms at this time  CBC: stable WNL  Most recent heparin level slightly subtherapeutic on 1050 units/hr  No bleeding or infusion issues per nursing  CrCl: 63 ml/min  Goal of Therapy: Heparin level 0.3-0.7 units/ml Monitor platelets by anticoagulation  protocol: Yes  Plan:  Increase heparin IV infusion to 1200 units/hr; no bolus  Check heparin level 8 hrs after rate change  Daily CBC, daily heparin level once stable  Monitor for signs of bleeding or thrombosis  F/u plans for long-term anticoagulation   Bernadene Personrew Trip Cavanagh, PharmD, BCPS [phone] 07/25/2018, 12:45 PM

## 2018-07-25 NOTE — Consult Note (Addendum)
..   NAME:  Hannah Holloway, MRN:  [id number], DOB:  31-Jul-1946, LOS: 0 ADMISSION DATE:  07/24/2018, CONSULTATION DATE:  07/25/18 REFERRING MD:  Sunnie NielsenEGALADO, CHIEF COMPLAINT:  Altered mental status   Brief History   72 yr old female who was admitted on 07/24/18 for mgmt of Acute PE secondary to lower ext thrombosis from immobility started on Heparin gtt for anticoagulation. Experienced acute change in mental status while in MRI where pt was found to have a large left sided IPH and IVH with midline shift 10 mm. History of present illness   72 yr old female with no sig PMHX per family and review of EMR presented on 11/20 with complaints of weakness and decreased responsiveness was transported by EMS  Past Medical History  No significant PMHx .Marland Kitchen. Active Ambulatory Problems    Diagnosis Date Noted  . No Active Ambulatory Problems   Resolved Ambulatory Problems    Diagnosis Date Noted  . No Resolved Ambulatory Problems   No Additional Past Medical History     Significant Hospital Events   1.  Acute PE: Pulmonary emboli are seen in left upper lobe pulmonary arteries While evaluation of right-sided pulmonary arteries is limited due to streak artifact off of the SVC there still appears to be emboli in the right upper lobe pulmonary arterial branchesa. Significant artifact limits evaluation of the lower lobe branches. 2. Bilateral lower extremity DVTs: Vasc US 11/21-> Findings consistent with acute deep vein thrombosis involving the right gastrocnemius vein, right posterior tibial vein, and right popliteal vein. And acute deep vein thrombosis involving the left peroneal vein, left posterior tibial vein, and left popliteal vein. 3. New left sided hemorrhage in the anterior left frontal lobe and posterior left frontal and parietal lobe resulting in significant mass effect with left-to-right midline shift up to 10 mm.  Smaller area of hemorrhage in the anterior inferior right frontal lobe.Along with   Intraventricular hemorrhage with fluid levels in the posterior horns of both lateral ventricles without hydrocephalus.  Consults:  Neurology:11/21 Neurosurgery: 11/21 PCCM 11/21  Procedures:  Emergent Endotracheal intubation- Anesthesia 07/25/18   Significant Diagnostic Tests:  CT Angio Chest 07/25/18: IMPRESSION: 1. Pulmonary emboli in the upper lobes. Evaluation of lower lobe branches is severely limited but no definitive lower lobe emboli are noted. 2. Multifocal infiltrates, likely pneumonia. Recommend follow-up to resolution. 3. Coronary artery calcifications. Atherosclerotic changes in the aorta. 4. Colonic diverticulosis without diverticulitis. 5. Fat stranding adjacent to the coccyx on axial image 82 is nonspecific.   Vascular Ultrasound 07/25/18: Summary: Right: Findings consistent with acute deep vein thrombosis involving the right gastrocnemius vein, right posterior tibial vein, and right popliteal vein. Left: Findings consistent with acute deep vein thrombosis involving the left peroneal vein, left posterior tibial vein, and left popliteal vein.   MRI 07/25/18:  IMPRESSION: 1. New large areas of hemorrhage in the anterior left frontal lobe and posterior left frontal and parietal lobe resulting in significant mass effect with left-to-right midline shift up to 10 mm. 2. Smaller area of hemorrhage in the anterior inferior right frontal lobe. 3. Intraventricular hemorrhage with fluid levels in the posterior horns of both lateral ventricles without hydrocephalus.  CXR post intubation 07/25/18 FINDINGS: Endotracheal tube with tip 4 cm from carina. Normal cardiac silhouette. Aorta is ectatic. Lungs are clear.  Micro Data:  Blood cx drawn 07/24/18: NGTD Urine cx drawn 07/25/18 Antimicrobials:  Rocephin 07/25/18 Azithromycin 07/25/18  Interim history/subjective:  Unable to obtain a subjective history from patient due to  acute encephalopathy and  inubated  Objective   Blood pressure (!) 174/89, pulse (!) 120, temperature 98.8 F (37.1 C), resp. rate (!) 30, height 5\' 6"  (1.676 m), weight 72.6 kg, SpO2 100 %.    Vent Mode: PRVC FiO2 (%):  [100 %] 100 % Set Rate:  [18 bmp] 18 bmp Vt Set:  [470 mL] 470 mL PEEP:  [5 cmH20] 5 cmH20 Plateau Pressure:  [15 cmH20] 15 cmH20  No intake or output data in the 24 hours ending 07/25/18 2017 Filed Weights   07/24/18 1918  Weight: 72.6 kg    Examination: General: in no distress HENT: endotracheal tube in place normocephalic no evidence of trauma. Prior to propofol Left pupil 5 Right pupil 3. Now both pinpoint and fixed Lungs: no wheeze, no rhonci + cough  Cardiovascular: S1 and S2 appreciated no murmur or rub Abdomen: soft non distended + BS in all 4 quadrants  Extremities: no pitting edema, no warmth on palpation no erythema. Neuro: RASS 0 to -1 on propofol and fentanyl GU: indwelling foley in place  Assessment & Plan:  1. Acute Intraparenchymal and Intraventricular Hemorrhage s/p anticoagulation for Acute Pulmonary embolus and lower ext thrombosis.  Pt is in critical condition. CTH prior to anticoagulation showed No acute intracranial abnormalities identified.   NSG consulted and pt is not a candidate for surgical intervention at this time. Consulted Neurology Pt has been started on Labetalol prn pushes and Cardene for BP control with goal SBP 120-165mmHg. Pt has an Arterial line for hemodynamic monitoring. Given Mannitol in an effort to reduce ICP given significant shift and not a candidate for surgical decompression or drain.  Will start 3% hypertonic with goal of hyperosmolarity. Increase RR on vent and keep alkalotic. Poor prognosis  2. Acute Pulmonary Embolism- thrombotic involvement seen in both left and right pulmonary tree. Due to acute hemorrhage and change in mental status Heparin gtt stopped. No reversal agent given due to the risk of increased thrombosis. Pt has no known h/o  malignancy or coagulopathy prior to this admission. Given b/l lower ext DVTs  And acute hemorrhage no SCDs at this time.  3. Acute encephalopathy and inability to protect Airway. Acute hypoxic respiratory failure- continues on mechanical ventilation with PRVC reviewed ABG: 7.48/32/38223  Will keep MVE increased to keep pt alkalotic  4. Multifocal infiltrates ->Community Acquired Pneumonia: CT chest shows Infiltrates seen in the right upper lobe, the superior segment of the right lower lobe, and the left lower lobe. On Ceftriaxone and Rocephin started on 11/21 f/u RVP, resp cx. Pt has a cough on exam. Start bronchodilators Q 6.   Best practice:  Diet: NPO Pain/Anxiety/Delirium protocol (if indicated): propofol gtt and fentanyl gtt VAP protocol (if indicated): YES DVT prophylaxis: NONE- massive hemorrhage GI prophylaxis: pepcid Glucose control: SSI Mobility: bedrest Code Status: Full Code  Family Communication and SW:  Lives with older sister and prior to admission was completely independent   Works at [employer] and last was at work 07/21/18         Contact numbers:  Brother: Renae Fickle [place]: [phone] Brother- Danella Deis [phone] and [phone] home Sis-n-law- Koleen Nimrod [phone] Brother- Ree Kida [phone] Phone: [phone] Merton Border (brother) Children- son Minerva Areola: no phone number available, family members are looking Disposition: ICU transfer to [facility]  Labs   CBC: Recent Labs  Lab 07/24/18 1950 07/25/18 0528  WBC 10.1 11.1*  NEUTROABS 8.7*  --   HGB 17.4* 14.3  HCT 54.5* 43.6  MCV 92.7 92.6  PLT 239 188    Basic Metabolic Panel: Recent Labs  Lab 07/24/18 1950 07/25/18 0528  NA 143 147*  K 3.9 3.9  CL 107 114*  CO2 22 21*  GLUCOSE 132* 128*  BUN 63* 41*  CREATININE 1.06* 0.68  CALCIUM 9.8 9.4  MG  --  2.6*  PHOS  --  1.9*   GFR: Estimated Creatinine Clearance: 62.9 mL/min (by C-G formula based on SCr of 0.68 mg/dL). Recent Labs   Lab 07/24/18 1950 07/24/18 2005 07/24/18 2120 07/25/18 0528  WBC 10.1  --   --  11.1*  LATICACIDVEN  --  2.59* 1.55  --     Liver Function Tests: Recent Labs  Lab 07/24/18 1950 07/25/18 0528  AST 22 25  ALT 18 17  ALKPHOS 67 53  BILITOT 1.2 1.5*  PROT 9.2* 7.8  ALBUMIN 3.8 3.1*   No results for input(s): LIPASE, AMYLASE in the last 168 hours. No results for input(s): AMMONIA in the last 168 hours.  ABG No results found for: PHART, PCO2ART, PO2ART, HCO3, TCO2, ACIDBASEDEF, O2SAT   Coagulation Profile: Recent Labs  Lab 07/24/18 1950  INR 1.09    Cardiac Enzymes: Recent Labs  Lab 07/24/18 2007 07/25/18 0201 07/25/18 0528 07/25/18 1718  CKTOTAL 145  --   --   --   TROPONINI  --  0.13* 0.10* 0.06*    HbA1C: Hgb A1c MFr Bld  Date/Time Value Ref Range Status  07/25/2018 05:28 AM 5.7 (H) 4.8 - 5.6 % Final    Comment:    (NOTE) Pre diabetes:          5.7%-6.4% Diabetes:              >6.4% Glycemic control for   <7.0% adults with diabetes     CBG: Recent Labs  Lab 07/24/18 2003  GLUCAP 82    Review of Systems:   Marland KitchenMarland KitchenReview of Systems  Unable to perform ROS: Mental status change  acute encephalopathy from significant hemorrhage   Past Medical History  She,  has no past medical history on file.   Surgical History   History reviewed. No pertinent surgical history.   Social History   reports that she has never smoked. She has never used smokeless tobacco. She reports that she does not drink alcohol or use drugs.   Family History   Her family history includes Prostate cancer in her brother and father. There is no history of CAD.   Allergies No Known Allergies   Home Medications  Prior to Admission medications   Medication Sig Start Date End Date Taking? Authorizing Provider  Cholecalciferol (VITAMIN D3) 25 MCG (1000 UT) CAPS Take 1,000 Units by mouth daily.   Yes [provider]  vitamin E 400 UNIT capsule Take 400 Units by mouth  daily.   Yes [provider]     Critical care time: 120 mins       I, Dr Newell Coral have personally reviewed patient's available data, including medical history, events of note, physical examination and test results as part of my evaluation. I have discussed with other care providers such as Neurology, Neurosurgery, RN and Elink.  The patient is critically ill with multiple organ systems failure and requires high complexity decision making for assessment and support, frequent evaluation and titration of therapies, application of advanced monitoring technologies and extensive interpretation of multiple databases.   Critical Care Time devoted to patient care services described in this note is  120 Minutes. This  time reflects time of care of this signee Dr Newell Coral. This critical care time does not reflect procedure time, or teaching time or supervisory time but could involve care discussion time   Dr. Newell Coral Pulmonary Critical Care Medicine  07/25/2018 9:33 PM

## 2018-07-25 NOTE — Consult Note (Signed)
Reason for Consult:intracerebral hemorrhage Referring Physician: [name] is an 72 y.o. female.  HPI: Patient is 72 year old woman with bilateral DVT, PE, started on heparin who developed AMS this evening.  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  She has had progressive decline requiring intubation.    History reviewed. No pertinent past medical history.  History reviewed. No pertinent surgical history.  Family History  Problem Relation Age of Onset  . Prostate cancer Father   . Prostate cancer Brother   . CAD Neg Hx     Social History:  reports that she has never smoked. She has never used smokeless tobacco. She reports that she does not drink alcohol or use drugs.  Allergies: No Known Allergies  Medications: I have reviewed the patient's current medications.  Results for orders placed or performed during the hospital encounter of 07/24/18 (from the past 48 hour(s))  CBC with Differential     Status: Abnormal   Collection Time: 07/24/18  7:50 PM  Result Value Ref Range   WBC 10.1 4.0 - 10.5 K/uL   RBC 5.88 (H) 3.87 - 5.11 MIL/uL   Hemoglobin 17.4 (H) 12.0 - 15.0 g/dL   HCT 54.5 (H) 36.0 - 46.0 %   MCV 92.7 80.0 - 100.0 fL   MCH 29.6 26.0 - 34.0 pg   MCHC 31.9 30.0 - 36.0 g/dL   RDW 15.3 11.5 - 15.5 %   Platelets 239 150 - 400 K/uL   nRBC 0.0 0.0 - 0.2 %   Neutrophils Relative % 86 %   Neutro Abs 8.7 (H) 1.7 - 7.7 K/uL   Lymphocytes Relative 6 %   Lymphs Abs 0.6 (L) 0.7 - 4.0 K/uL   Monocytes Relative 7 %   Monocytes Absolute 0.7 0.1 - 1.0 K/uL   Eosinophils Relative 0 %   Eosinophils Absolute 0.0 0.0 - 0.5 K/uL   Basophils Relative 0 %   Basophils Absolute 0.0 0.0 - 0.1 K/uL   Immature Granulocytes 1 %   Abs Immature Granulocytes 0.05 0.00 - 0.07 K/uL    Comment: Performed at [facility], [place] [address] [place], [place] [address]  Comprehensive metabolic panel      Status: Abnormal   Collection Time: 07/24/18  7:50 PM  Result Value Ref Range   Sodium 143 135 - 145 mmol/L   Potassium 3.9 3.5 - 5.1 mmol/L   Chloride 107 98 - 111 mmol/L   CO2 22 22 - 32 mmol/L   Glucose, Bld 132 (H) 70 - 99 mg/dL   BUN 63 (H) 8 - 23 mg/dL   Creatinine, Ser 1.06 (H) 0.44 - 1.00 mg/dL   Calcium 9.8 8.9 - 10.3 mg/dL   Total Protein 9.2 (H) 6.5 - 8.1 g/dL   Albumin 3.8 3.5 - 5.0 g/dL   AST 22 15 - 41 U/L   ALT 18 0 - 44 U/L   Alkaline Phosphatase 67 38 - 126 U/L   Total Bilirubin 1.2 0.3 - 1.2 mg/dL   GFR calc non Af Amer 50 (L) >60 mL/min   GFR calc Af Amer 58 (L) >60 mL/min    Comment: (NOTE) The eGFR has been calculated using the CKD EPI equation. This calculation has not been validated in all clinical situations. eGFR's persistently <60 mL/min signify possible Chronic Kidney Disease.    Anion gap 14 5 - 15    Comment: Performed at [employer]  [facility], [place] [address], [place], [place] [address]  Protime-INR     Status: None   Collection Time: 07/24/18  7:50 PM  Result Value Ref Range   Prothrombin Time 14.0 11.4 - 15.2 seconds   INR 1.09     Comment: Performed at [facility], [place] [address], [place], [place] [address]  Blood culture (routine x 2)     Status: None (Preliminary result)   Collection Time: 07/24/18  7:52 PM  Result Value Ref Range   Specimen Description      BLOOD RIGHT HAND Performed at [facility] [address] [place], [place] [address]    Special Requests      BOTTLES DRAWN AEROBIC ONLY Blood Culture adequate volume Performed at [facility] [address], [place], [place] [address]    Culture      NO GROWTH < 24 HOURS Performed at [facility] [address], [place], [place] [address]    Report Status PENDING   Blood culture (routine x 2)     Status: None (Preliminary result)   Collection Time: 07/24/18  7:52 PM  Result Value Ref Range   Specimen  Description      BLOOD LEFT HAND Performed at [facility] [address] [place], [place] [address]    Special Requests      BOTTLES DRAWN AEROBIC ONLY Blood Culture adequate volume Performed at [facility] [address], [place], [place] [address]    Culture      NO GROWTH < 24 HOURS Performed at [facility] [address], [place], [place] [address]    Report Status PENDING   I-stat troponin, ED     Status: Abnormal   Collection Time: 07/24/18  8:02 PM  Result Value Ref Range   Troponin i, poc 0.15 (HH) 0.00 - 0.08 ng/mL   Comment NOTIFIED PHYSICIAN    Comment 3            Comment: Due to the release kinetics of cTnI, a negative result within the first hours of the onset of symptoms does not rule out myocardial infarction with certainty. If myocardial infarction is still suspected, repeat the test at appropriate intervals.   CBG monitoring, ED     Status: None   Collection Time: 07/24/18  8:03 PM  Result Value Ref Range   Glucose-Capillary 82 70 - 99 mg/dL  I-Stat CG4 Lactic Acid, ED     Status: Abnormal   Collection Time: 07/24/18  8:05 PM  Result Value Ref Range   Lactic Acid, Venous 2.59 (HH) 0.5 - 1.9 mmol/L   Comment NOTIFIED PHYSICIAN   CK     Status: None   Collection Time: 07/24/18  8:07 PM  Result Value Ref Range   Total CK 145 38 - 234 U/L    Comment: Performed at [facility], [place] [address] [place], [place] [address]  I-Stat CG4 Lactic Acid, ED     Status: None   Collection Time: 07/24/18  9:20 PM  Result Value Ref Range   Lactic Acid, Venous 1.55 0.5 - 1.9 mmol/L  Urinalysis, Routine w reflex microscopic     Status: Abnormal   Collection Time: 07/25/18 12:05 AM  Result Value Ref Range   Color, Urine YELLOW YELLOW   APPearance HAZY (A) CLEAR   Specific Gravity, Urine 1.023 1.005 - 1.030   pH 5.0 5.0 - 8.0  Glucose, UA NEGATIVE NEGATIVE mg/dL   Hgb urine dipstick LARGE (A)  NEGATIVE   Bilirubin Urine NEGATIVE NEGATIVE   Ketones, ur 20 (A) NEGATIVE mg/dL   Protein, ur 100 (A) NEGATIVE mg/dL   Nitrite NEGATIVE NEGATIVE   Leukocytes, UA MODERATE (A) NEGATIVE   RBC / HPF 21-50 0 - 5 RBC/hpf   WBC, UA 21-50 0 - 5 WBC/hpf   Bacteria, UA MANY (A) NONE SEEN   Squamous Epithelial / LPF 0-5 0 - 5    Comment: Performed at [facility], [place] [address] [place] [address], [place], [place] [address]  Strep pneumoniae urinary antigen     Status: None   Collection Time: 07/25/18 12:05 AM  Result Value Ref Range   Strep Pneumo Urinary Antigen NEGATIVE NEGATIVE    Comment:        Infection due to S. pneumoniae cannot be absolutely ruled out since the antigen present may be below the detection limit of the test. PERFORMED AT [facility] Performed at [facility], [place] [address] [place], [place] [address]   D-dimer, quantitative (not at [facility])     Status: Abnormal   Collection Time: 07/25/18  2:01 AM  Result Value Ref Range   D-Dimer, Quant >20.00 (H) 0.00 - 0.50 ug/mL-FEU    Comment: (NOTE) At the manufacturer cut-off of 0.50 ug/mL FEU, this assay has been documented to exclude PE with a sensitivity and negative predictive value of 97 to 99%.  At this time, this assay has not been approved by the FDA to exclude DVT/VTE. Results should be correlated with clinical presentation. Performed at [facility], [place] [address], [place], [place] [address]   Troponin I - Now Then Q6H     Status: Abnormal   Collection Time: 07/25/18  2:01 AM  Result Value Ref Range   Troponin I 0.13 (HH) <0.03 ng/mL    Comment: CRITICAL RESULT CALLED TO, READ BACK BY AND VERIFIED WITHHeywood Bene RN [id number] 07/25/18 A NAVARRO Performed at [facility], [place] [address] [place] [address] [place], [place] [address]   Hemoglobin A1c     Status: Abnormal   Collection Time: 07/25/18  5:28 AM  Result Value Ref Range   Hgb A1c MFr Bld 5.7 (H) 4.8 - 5.6 %     Comment: (NOTE) Pre diabetes:          5.7%-6.4% Diabetes:              >6.4% Glycemic control for   <7.0% adults with diabetes    Mean Plasma Glucose 116.89 mg/dL    Comment: Performed at [place] [address] [place], [place] [address]  Magnesium     Status: Abnormal   Collection Time: 07/25/18  5:28 AM  Result Value Ref Range   Magnesium 2.6 (H) 1.7 - 2.4 mg/dL    Comment: Performed at [facility], [place] [address] [place], [place] [address]  Phosphorus     Status: Abnormal   Collection Time: 07/25/18  5:28 AM  Result Value Ref Range   Phosphorus 1.9 (L) 2.5 - 4.6 mg/dL    Comment: Performed at [facility], [place] [address] [place], [place] [address]  TSH     Status: None   Collection Time: 07/25/18  5:28 AM  Result Value Ref Range   TSH 0.568 0.350 - 4.500 uIU/mL    Comment: Performed by a 3rd Generation assay with a functional sensitivity of <=0.01 uIU/mL.  Performed at [facility], [place] [address], [place], [place] [address]   Comprehensive metabolic panel     Status: Abnormal   Collection Time: 07/25/18  5:28 AM  Result Value Ref Range   Sodium 147 (H) 135 - 145 mmol/L   Potassium 3.9 3.5 - 5.1 mmol/L   Chloride 114 (H) 98 - 111 mmol/L   CO2 21 (L) 22 - 32 mmol/L   Glucose, Bld 128 (H) 70 - 99 mg/dL   BUN 41 (H) 8 - 23 mg/dL   Creatinine, Ser 0.68 0.44 - 1.00 mg/dL   Calcium 9.4 8.9 - 10.3 mg/dL   Total Protein 7.8 6.5 - 8.1 g/dL   Albumin 3.1 (L) 3.5 - 5.0 g/dL   AST 25 15 - 41 U/L   ALT 17 0 - 44 U/L   Alkaline Phosphatase 53 38 - 126 U/L   Total Bilirubin 1.5 (H) 0.3 - 1.2 mg/dL   GFR calc non Af Amer >60 >60 mL/min   GFR calc Af Amer >60 >60 mL/min    Comment: (NOTE) The eGFR has been calculated using the CKD EPI equation. This calculation has not been validated in all clinical situations. eGFR's persistently <60 mL/min signify possible Chronic Kidney Disease.    Anion gap 12 5 - 15     Comment: Performed at [facility], [place] [address], [place], [place] [address]  CBC     Status: Abnormal   Collection Time: 07/25/18  5:28 AM  Result Value Ref Range   WBC 11.1 (H) 4.0 - 10.5 K/uL   RBC 4.71 3.87 - 5.11 MIL/uL   Hemoglobin 14.3 12.0 - 15.0 g/dL   HCT 43.6 36.0 - 46.0 %   MCV 92.6 80.0 - 100.0 fL   MCH 30.4 26.0 - 34.0 pg   MCHC 32.8 30.0 - 36.0 g/dL   RDW 15.3 11.5 - 15.5 %   Platelets 188 150 - 400 K/uL   nRBC 0.0 0.0 - 0.2 %    Comment: Performed at [facility], [place] [address], [place], [place] [address]  Troponin I - Now Then Q6H     Status: Abnormal   Collection Time: 07/25/18  5:28 AM  Result Value Ref Range   Troponin I 0.10 (HH) <0.03 ng/mL    Comment: CRITICAL VALUE NOTED.  VALUE IS CONSISTENT WITH PREVIOUSLY REPORTED AND CALLED VALUE. Performed at [facility], [place] [address], [place], [place] [address]   Heparin level (unfractionated)     Status: Abnormal   Collection Time: 07/25/18 12:00 PM  Result Value Ref Range   Heparin Unfractionated 0.28 (L) 0.30 - 0.70 IU/mL    Comment: (NOTE) If heparin results are below expected values, and patient dosage has  been confirmed, suggest follow up testing of antithrombin III levels. Performed at [facility], [place] [address] [place], [place] [address]   Troponin I - Now Then Q6H     Status: Abnormal   Collection Time: 07/25/18  5:18 PM  Result Value Ref Range   Troponin I 0.06 (HH) <0.03 ng/mL    Comment: CRITICAL VALUE NOTED.  VALUE IS CONSISTENT WITH PREVIOUSLY REPORTED AND CALLED VALUE. Performed at [facility], [place] [address], [place],  [address]   Protime-INR     Status: None   Collection Time: 07/25/18  8:19 PM  Result Value Ref Range   Prothrombin Time 14.0 11.4 - 15.2 seconds   INR 1.09  Comment: Performed at [facility], [place] [address] [place], [place] [address]  Sodium      Status: None   Collection Time: 07/25/18  8:19 PM  Result Value Ref Range   Sodium 144 135 - 145 mmol/L    Comment: Performed at [facility], [place] [address] [place], [place] [address]  Blood gas, arterial     Status: Abnormal   Collection Time: 07/25/18  8:39 PM  Result Value Ref Range   FIO2 100.00    Delivery systems VENTILATOR    Mode PRESSURE REGULATED VOLUME CONTROL    VT 470 mL   LHR 18 resp/min   Peep/cpap 5.0 cm H20   pH, Arterial 7.482 (H) 7.350 - 7.450   pCO2 arterial 32.2 32.0 - 48.0 mmHg   pO2, Arterial 382 (H) 83.0 - 108.0 mmHg   Bicarbonate 23.9 20.0 - 28.0 mmol/L   Acid-Base Excess 1.4 0.0 - 2.0 mmol/L   O2 Saturation 99.9 %   Patient temperature 98.6    Collection site A-LINE    Drawn by [id number]    Sample type ARTERIAL DRAW     Comment: Performed at [facility], [place] [address] [place], [place] [address]    Ct Head Wo Contrast  Result Date: 07/25/2018 CLINICAL DATA:  Altered mental status EXAM: CT HEAD WITHOUT CONTRAST TECHNIQUE: Contiguous axial images were obtained from the base of the skull through the vertex without intravenous contrast. COMPARISON:  None. FINDINGS: Brain: No subdural, epidural, or subarachnoid hemorrhage. Cerebellum, brainstem, and basal cisterns are normal. Ventricles and sulci are mildly prominent. No acute cortical ischemia or infarct. No mass effect or midline shift. Vascular: Calcified atherosclerosis in the intracranial carotids. Skull: Normal. Negative for fracture or focal lesion. Sinuses/Orbits: No acute finding. Other: None. IMPRESSION: No acute intracranial abnormalities identified. Electronically Signed   By: Dorise Bullion III M.D   On: 07/25/2018 00:53   Ct Angio Chest Pe W Or Wo Contrast  Result Date: 07/25/2018 CLINICAL DATA:  Weakness. Question diverticulitis. Elevated white blood cell count. EXAM: CT ANGIOGRAPHY CHEST CT ABDOMEN AND PELVIS WITH CONTRAST TECHNIQUE: Multidetector CT  imaging of the chest was performed using the standard protocol during bolus administration of intravenous contrast. Multiplanar CT image reconstructions and MIPs were obtained to evaluate the vascular anatomy. Multidetector CT imaging of the abdomen and pelvis was performed using the standard protocol during bolus administration of intravenous contrast. CONTRAST:  130m ISOVUE-370 IOPAMIDOL (ISOVUE-370) INJECTION 76% COMPARISON:  Chest x-ray July 24, 2018 FINDINGS: CTA CHEST FINDINGS Cardiovascular: The thoracic aorta demonstrates atherosclerosis with no aneurysm or dissection. The heart size is normal. Evaluation of the pulmonary arteries is limited due to significant stairstep artifact from respiratory motion. Pulmonary emboli are seen in left upper lobe pulmonary arteries such as on series 7, image 140 and coronal images 56 and 53. Evaluation of right-sided pulmonary arteries is limited due to streak artifact off of the SVC. That being said, there appear to be emboli in the right upper lobe pulmonary arterial branchesa. Significant artifact limits evaluation of the lower lobe branches. Mediastinum/Nodes: The thyroid and esophagus are normal. No adenopathy or effusion. Lungs/Pleura: Infiltrates seen in the right upper lobe, the superior segment of the right lower lobe, and the left lower lobe. Central airways are normal. No pneumothorax. No suspicious nodules or masses. Musculoskeletal: See below. Review of the MIP images confirms the above findings. CT ABDOMEN and PELVIS FINDINGS Hepatobiliary: Probable small hepatic cysts, too small to characterize.  No suspicious masses. The portal vein is unremarkable. The gallbladder is unremarkable. Pancreas: Unremarkable. No pancreatic ductal dilatation or surrounding inflammatory changes. Spleen: Normal in size without focal abnormality. Adrenals/Urinary Tract: Adrenal glands are normal. Probable tiny cyst in the right kidney, too small to characterize. No suspicious  masses or hydronephrosis. No filling defects in the upper collecting systems on delayed images. No ureteral stones are noted. The bladder is distended but otherwise unremarkable. Stomach/Bowel: The stomach and small bowel are normal. Colonic diverticulosis is seen without diverticulitis. The colon is otherwise normal. Limited views of the appendix are normal. Vascular/Lymphatic: Atherosclerotic changes are seen in the nonaneurysmal aorta. No adenopathy. Reproductive: The uterus and ovaries are normal. Other: No free air or free fluid. Increased attenuation in the fat adjacent to the coccyx as seen on axial image 82. Musculoskeletal: Degenerative changes are seen in the hips bilaterally. No acute bony abnormalities. Review of the MIP images confirms the above findings. IMPRESSION: 1. Pulmonary emboli in the upper lobes. Evaluation of lower lobe branches is severely limited but no definitive lower lobe emboli are noted. 2. Multifocal infiltrates, likely pneumonia. Recommend follow-up to resolution. 3. Coronary artery calcifications. Atherosclerotic changes in the aorta. 4. Colonic diverticulosis without diverticulitis. 5. Fat stranding adjacent to the coccyx on axial image 82 is nonspecific. Findings called to Dr. Florina Ou. Electronically Signed   By: Dorise Bullion III M.D   On: 07/25/2018 02:49   Mr Brain Wo Contrast  Result Date: 07/25/2018 CLINICAL DATA:  Acute mental status changes. Pulmonary embolus and DVT. EXAM: MRI HEAD WITHOUT CONTRAST TECHNIQUE: Multiplanar, multiecho pulse sequences of the brain and surrounding structures were obtained without intravenous contrast. COMPARISON:  CT head without contrast 07/25/2018 12:25 a.m. FINDINGS: Brain: Multifocal areas of acute hemorrhage are present. Anterior left frontal lobe hemorrhage measures 4.0 x 4.5 x 4.3 Cm. A new hemorrhage in the posterior left frontal and left parietal lobe measures 5.5 x 3.0 x 3.5 cm. A smaller area of hemorrhage in the anterior  inferior right frontal lobe measures 2.4 cm. Left right midline shift is 10 mm. There is effacement of the left lateral ventricle. Blood products are seen within the ventricles bilaterally. No hydrocephalus is evident. Posterior fossa is unremarkable. No acute parenchymal infarct is present otherwise. Vascular: Flow is present in the major intracranial arteries. Skull and upper cervical spine: Craniocervical junction is normal. Upper cervical spine is normal. Sinuses/Orbits: The left sphenoid sinus is partially opacified. No other significant paranasal sinus disease is present. Mastoid air cells are clear. Globes and orbits are within normal limits. IMPRESSION: 1. New large areas of hemorrhage in the anterior left frontal lobe and posterior left frontal and parietal lobe resulting in significant mass effect with left-to-right midline shift up to 10 mm. 2. Smaller area of hemorrhage in the anterior inferior right frontal lobe. 3. Intraventricular hemorrhage with fluid levels in the posterior horns of both lateral ventricles without hydrocephalus. Critical Value/emergent results were called by telephone at the time of interpretation on 07/25/2018 at 6:39 pm to Dr. Niel Hummer , who verbally acknowledged these results. Electronically Signed   By: San Morelle M.D.   On: 07/25/2018 19:01   Mr Lumbar Spine W Wo Contrast  Result Date: 07/25/2018 CLINICAL DATA:  Generalized weakness for 3 days EXAM: MRI LUMBAR SPINE WITHOUT AND WITH CONTRAST TECHNIQUE: Multiplanar and multiecho pulse sequences of the lumbar spine were obtained without and with intravenous contrast. CONTRAST:  7 cc Gadavist intravenous COMPARISON:  None. FINDINGS: Segmentation:  5 lumbar  type vertebral bodies Alignment:  Normal Vertebrae: No fracture, evidence of discitis, or aggressive bone lesion. Presumed bone island in the L1 body. Sacral meningeal cyst with prominent scalloping at the level of the S2 and S3 bodies, with imperceptible  cortex at some levels. Conus medullaris and cauda equina: Conus extends to the L1-2 level. Conus and cauda equina appear normal. Specifically the nerve roots are not thickened or enhancing for Guillain-Barre. There is dependent lobulated material within the thecal sac at L5 and below, which is nonenhancing. Paraspinal and other soft tissues: Full urinary bladder. Sigmoid diverticulosis. Disc levels: Good disc height and hydration for age. Mild generalized facet spurring. No impingement. IMPRESSION: 1. No definitive cause of weakness. The cauda equina conus has a normal appearance and there is no compressive stenosis. 2. Unusual material in the lower and dependent thecal sac which could be debris or scarring related to notably expansile sacral Tarlov cysts. Please correlate for trauma history or meningitis symptoms. Lumbar puncture may be helpful, although complicated by acute PE. 3. History of incontinence. There is moderate distension of the urinary bladder-consider checking postvoid residual. Electronically Signed   By: Monte Fantasia M.D.   On: 07/25/2018 07:13   Ct Abdomen Pelvis W Contrast  Result Date: 07/25/2018 CLINICAL DATA:  Weakness. Question diverticulitis. Elevated white blood cell count. EXAM: CT ANGIOGRAPHY CHEST CT ABDOMEN AND PELVIS WITH CONTRAST TECHNIQUE: Multidetector CT imaging of the chest was performed using the standard protocol during bolus administration of intravenous contrast. Multiplanar CT image reconstructions and MIPs were obtained to evaluate the vascular anatomy. Multidetector CT imaging of the abdomen and pelvis was performed using the standard protocol during bolus administration of intravenous contrast. CONTRAST:  14m ISOVUE-370 IOPAMIDOL (ISOVUE-370) INJECTION 76% COMPARISON:  Chest x-ray July 24, 2018 FINDINGS: CTA CHEST FINDINGS Cardiovascular: The thoracic aorta demonstrates atherosclerosis with no aneurysm or dissection. The heart size is normal. Evaluation of  the pulmonary arteries is limited due to significant stairstep artifact from respiratory motion. Pulmonary emboli are seen in left upper lobe pulmonary arteries such as on series 7, image 140 and coronal images 56 and 53. Evaluation of right-sided pulmonary arteries is limited due to streak artifact off of the SVC. That being said, there appear to be emboli in the right upper lobe pulmonary arterial branchesa. Significant artifact limits evaluation of the lower lobe branches. Mediastinum/Nodes: The thyroid and esophagus are normal. No adenopathy or effusion. Lungs/Pleura: Infiltrates seen in the right upper lobe, the superior segment of the right lower lobe, and the left lower lobe. Central airways are normal. No pneumothorax. No suspicious nodules or masses. Musculoskeletal: See below. Review of the MIP images confirms the above findings. CT ABDOMEN and PELVIS FINDINGS Hepatobiliary: Probable small hepatic cysts, too small to characterize. No suspicious masses. The portal vein is unremarkable. The gallbladder is unremarkable. Pancreas: Unremarkable. No pancreatic ductal dilatation or surrounding inflammatory changes. Spleen: Normal in size without focal abnormality. Adrenals/Urinary Tract: Adrenal glands are normal. Probable tiny cyst in the right kidney, too small to characterize. No suspicious masses or hydronephrosis. No filling defects in the upper collecting systems on delayed images. No ureteral stones are noted. The bladder is distended but otherwise unremarkable. Stomach/Bowel: The stomach and small bowel are normal. Colonic diverticulosis is seen without diverticulitis. The colon is otherwise normal. Limited views of the appendix are normal. Vascular/Lymphatic: Atherosclerotic changes are seen in the nonaneurysmal aorta. No adenopathy. Reproductive: The uterus and ovaries are normal. Other: No free air or free fluid. Increased attenuation in  the fat adjacent to the coccyx as seen on axial image 82.  Musculoskeletal: Degenerative changes are seen in the hips bilaterally. No acute bony abnormalities. Review of the MIP images confirms the above findings. IMPRESSION: 1. Pulmonary emboli in the upper lobes. Evaluation of lower lobe branches is severely limited but no definitive lower lobe emboli are noted. 2. Multifocal infiltrates, likely pneumonia. Recommend follow-up to resolution. 3. Coronary artery calcifications. Atherosclerotic changes in the aorta. 4. Colonic diverticulosis without diverticulitis. 5. Fat stranding adjacent to the coccyx on axial image 82 is nonspecific. Findings called to Dr. Florina Ou. Electronically Signed   By: Dorise Bullion III M.D   On: 07/25/2018 02:49   Dg Chest Port 1 View  Result Date: 07/25/2018 CLINICAL DATA:  Status post intubation EXAM: PORTABLE CHEST 1 VIEW COMPARISON:  CT 07/25/2018 FINDINGS: Endotracheal tube with tip 4 cm from carina. Normal cardiac silhouette. Aorta is ectatic. Lungs are clear. IMPRESSION: Endotracheal tube in good position. Electronically Signed   By: Suzy Bouchard M.D.   On: 07/25/2018 20:49   Dg Chest Port 1 View  Result Date: 07/24/2018 CLINICAL DATA:  Weakness EXAM: PORTABLE CHEST 1 VIEW COMPARISON:  11/21/2012 FINDINGS: New elevation of the right hemidiaphragm. Accentuated fullness in the AP window region compared to prior. Atherosclerotic calcification of the aortic arch. IMPRESSION: 1. New elevation of the right hemidiaphragm. 2. Accentuated fullness in the AP window region compared to prior, cannot exclude adenopathy or mass. Consider chest CT for further evaluation. Electronically Signed   By: Van Clines M.D.   On: 07/24/2018 20:34   Vas [place] Lower Extremity Venous (dvt)  Result Date: 07/25/2018  Lower Venous Study Indications: SOB, and pulmonary embolism.  Risk Factors: Confirmed PE. Performing Technologist: Toma Copier RVS  Examination Guidelines: A complete evaluation includes B-mode imaging, spectral Doppler,  color Doppler, and power Doppler as needed of all accessible portions of each vessel. Bilateral testing is considered an integral part of a complete examination. Limited examinations for reoccurring indications may be performed as noted.  Right Venous Findings: +---------+---------------+---------+-----------+----------+------------------+          CompressibilityPhasicitySpontaneityPropertiesSummary            +---------+---------------+---------+-----------+----------+------------------+ CFV      Full           Yes      Yes                                     +---------+---------------+---------+-----------+----------+------------------+ SFJ      Full                                                            +---------+---------------+---------+-----------+----------+------------------+ FV Prox  Full           Yes      Yes                                     +---------+---------------+---------+-----------+----------+------------------+ FV Mid   Full                                                            +---------+---------------+---------+-----------+----------+------------------+  FV DistalFull           Yes      Yes                                     +---------+---------------+---------+-----------+----------+------------------+ PFV      Full           Yes      Yes                                     +---------+---------------+---------+-----------+----------+------------------+ POP      Partial        Yes      Yes                                     +---------+---------------+---------+-----------+----------+------------------+ PTV      Partial                                      Acute mid to                                                             proximal           +---------+---------------+---------+-----------+----------+------------------+ PERO                                                  Difficult to                                                              visualize          +---------+---------------+---------+-----------+----------+------------------+ Gastroc  Partial                                      Acute              +---------+---------------+---------+-----------+----------+------------------+  Right Technical Findings: Difficult to position the leg for distal imaging due to dementia and patient poor to repond.  Left Venous Findings: +---------+---------------+---------+-----------+----------+-------------+          CompressibilityPhasicitySpontaneityPropertiesSummary       +---------+---------------+---------+-----------+----------+-------------+ CFV      Full           Yes      Yes                                +---------+---------------+---------+-----------+----------+-------------+ SFJ      Full                                                       +---------+---------------+---------+-----------+----------+-------------+  FV Prox  Full           Yes      Yes                                +---------+---------------+---------+-----------+----------+-------------+ FV Mid   Full                                                       +---------+---------------+---------+-----------+----------+-------------+ FV DistalFull           Yes      Yes                                +---------+---------------+---------+-----------+----------+-------------+ PFV      Full           Yes      Yes                                +---------+---------------+---------+-----------+----------+-------------+ POP      Partial                                      Mid to distal +---------+---------------+---------+-----------+----------+-------------+ PTV      None                                                       +---------+---------------+---------+-----------+----------+-------------+ PERO     None                                                        +---------+---------------+---------+-----------+----------+-------------+  Left Technical Findings: Difficult to positin the lef for distal imaging due to dementia and patient poorto respond. Rouleux flow was noted in the proxiaml popliteal vein.   Summary: Right: Findings consistent with acute deep vein thrombosis involving the right gastrocnemius vein, right posterior tibial vein, and right popliteal vein. See technical findings listed above Left: Findings consistent with acute deep vein thrombosis involving the left peroneal vein, left posterior tibial vein, and left popliteal vein. See technical findings listed above  *See table(s) above for measurements and observations. Electronically signed by Monica Martinez MD on 07/25/2018 at 3:36:49 PM.    Final     Review of Systems - Negative except per HPI    Blood pressure (!) 174/89, pulse (!) 120, temperature 98.8 F (37.1 C), resp. rate (!) 30, height 5' 6" (1.676 m), weight 72.6 kg, SpO2 100 %. Physical Exam  Constitutional: She appears well-developed and well-nourished.  HENT:  Head: Normocephalic and atraumatic.  Neurological: She is unresponsive. A cranial nerve deficit is present.  Patient is intubated.  Left pupil is 4 mm and minimally reactive.  Right pupil is 2 mm.  No Doll's eyes.  Minimal response to noxious stimulus, other than some spontaneous  movement of left upper extremity.  No movement of right side to noxious stim.    Assessment/Plan: Patient has had devastating ICH and poor neurologic exam.  She also has PE and bilateral DVTs, for which she should be anticoagulated.  Her neurologic exam is moribund.  I have explained to patient's brother that surgery would not reverse this devastating injury.  I have recommended against surgery and in favor of supportive care, but explained that the prognosis for meaningful recovery, or even survival, is extremely poor.    Peggyann Shoals, MD 07/25/2018, 10:14 PM

## 2018-07-25 NOTE — Procedures (Signed)
Arterial Catheter Insertion Procedure Note Jene EveryLucille Cassara [id number] [birth year]  Procedure: Insertion of Arterial Catheter  Indications: Blood pressure monitoring  Procedure Details Consent: Unable to obtain consent because of emergent medical necessity. Time Out: Verified patient identification, verified procedure, site/side was marked, verified correct patient position, special equipment/implants available, medications/allergies/relevent history reviewed, required imaging and test results available.  Performed  Maximum sterile technique was used including antiseptics, cap, gloves, gown, hand hygiene, mask and sheet. Skin prep: Chlorhexidine; local anesthetic administered 20 gauge catheter was inserted into right radial artery using the Seldinger technique. ULTRASOUND GUIDANCE USED: NO Evaluation Blood flow good; BP tracing good. Complications: No apparent complications.   Carney HarderKnapp, Robertlee Rogacki Lynn 07/25/2018

## 2018-07-25 NOTE — H&P (Addendum)
Hannah Holloway ZOX:[id number] DOB: [birth year] DOA: 07/24/2018     PCP: Patient, No Pcp Per   Outpatient Specialists:  NONE    Patient arrived to ER on 07/24/18 at 1853  Patient coming from: home Lives With family   Chief Complaint:  Chief Complaint  Patient presents with  . Weakness    HPI: Hannah Holloway is a 72 y.o. female with medical history significant of dementia      Presented with  Fatigue unable to get up patient at her baseline has some degree of dementia usually lives with her sisters who left town 4 days ago so patient apparently has been unable to take p.o.'s for the past few days have not been able to get out of bed patient at her baseline incontinent of urine.  Her friends checked up on her Found at home soaked in urine  No chest pain, no fevers or chills no shortness of breath no nausea vomiting she is been too weak to get up to use the bathroom for the past 2 days or eat or drink. Reports some shortness of breath no back pain Never had a colonoscopy or Mammogram      While in ER: 2.5 improved to 1.5 Trop 0.15 CK wnl  ECG non specific The following Work up has been ordered so far:  Orders Placed This Encounter  Procedures  . Urine culture  . Blood culture (routine x 2)  . DG Chest Port 1 View  . Urinalysis, Routine w reflex microscopic  . CBC with Differential  . Comprehensive metabolic panel  . Protime-INR  . CK  . Cardiac monitoring  . Consult to hospitalist  . Consult to hospitalist  No call back from initial page request  . CBG monitoring, ED  . I-stat troponin, ED  . I-Stat CG4 Lactic Acid, ED  . ED EKG  . EKG 12-Lead  . Saline lock IV      Following Medications were ordered in ER: Medications  sodium chloride 0.9 % bolus 1,000 mL (0 mLs Intravenous Stopped 07/24/18 2116)  0.9 %  sodium chloride infusion ( Intravenous New Bag/Given 07/24/18 2348)    Significant initial  Findings: Abnormal Labs Reviewed  CBC WITH  DIFFERENTIAL/PLATELET - Abnormal; Notable for the following components:      Result Value   RBC 5.88 (*)    Hemoglobin 17.4 (*)    HCT 54.5 (*)    Neutro Abs 8.7 (*)    Lymphs Abs 0.6 (*)    All other components within normal limits  COMPREHENSIVE METABOLIC PANEL - Abnormal; Notable for the following components:   Glucose, Bld 132 (*)    BUN 63 (*)    Creatinine, Ser 1.06 (*)    Total Protein 9.2 (*)    GFR calc non Af Amer 50 (*)    GFR calc Af Amer 58 (*)    All other components within normal limits  I-STAT TROPONIN, ED - Abnormal; Notable for the following components:   Troponin i, poc 0.15 (*)    All other components within normal limits  I-STAT CG4 LACTIC ACID, ED - Abnormal; Notable for the following components:   Lactic Acid, Venous 2.59 (*)    All other components within normal limits     Lactic Acid, Venous    Component Value Date/Time   LATICACIDVEN 1.55 07/24/2018 2120   CK 145  Na 143 K 3.9  Cr    stable,    Lab Results  Component Value  Date   CREATININE 1.06 (H) 07/24/2018      WBC  10.1  HG/HCT       Component Value Date/Time   HGB 17.4 (H) 07/24/2018 1950   HCT 54.5 (H) 07/24/2018 1950    Troponin (Point of Care Test) Recent Labs    07/24/18 2002  TROPIPOC 0.15*     BNP (last 3 results) No results for input(s): BNP in the last 8760 hours.  ProBNP (last 3 results) No results for input(s): PROBNP in the last 8760 hours.  INR 1.09   UA   evidence of UTI     CT HEAD NON acute  CXR - Accentuated fullness in the chest  CT A CT Abd showed bilateral PE  ECG:  Personally reviewed by me showing: HR : 100 Rhythm: Sinus tachycardia   Ischemic changes*nonspecific changes, no evidence of ischemic changes QTC 449      ED Triage Vitals  Enc Vitals Group     BP 07/24/18 1918 (!) 149/88     Pulse Rate 07/24/18 1918 91     Resp 07/24/18 1918 18     Temp 07/24/18 1918 97.7 F (36.5 C)     Temp Source 07/24/18 1918 Oral     SpO2  07/24/18 1918 100 %     Weight 07/24/18 1918 160 lb (72.6 kg)     Height 07/24/18 1918 5\' 6"  (1.676 m)     Head Circumference --      Peak Flow --      Pain Score 07/24/18 1932 0     Pain Loc --      Pain Edu? --      Excl. in GC? --   TMAX(24)@       Latest  Blood pressure (!) 171/92, pulse 95, temperature 97.7 F (36.5 C), temperature source Oral, resp. rate (!) 28, height 5\' 6"  (1.676 m), weight 72.6 kg, SpO2 95 %.   Hospitalist was called for admission for elevated troponin   Review of Systems:    Pertinent positives include: confusion  Constitutional:  No weight loss, night sweats, Fevers, chills, fatigue, weight loss  HEENT:  No headaches, Difficulty swallowing,Tooth/dental problems,Sore throat,  No sneezing, itching, ear ache, nasal congestion, post nasal drip,  Cardio-vascular:  No chest pain, Orthopnea, PND, anasarca, dizziness, palpitations.no Bilateral lower extremity swelling  GI:  No heartburn, indigestion, abdominal pain, nausea, vomiting, diarrhea, change in bowel habits, loss of appetite, melena, blood in stool, hematemesis Resp:  no shortness of breath at rest. No dyspnea on exertion, No excess mucus, no productive cough, No non-productive cough, No coughing up of blood.No change in color of mucus.No wheezing. Skin:  no rash or lesions. No jaundice GU:  no dysuria, change in color of urine, no urgency or frequency. No straining to urinate.  No flank pain.  Musculoskeletal:  No joint pain or no joint swelling. No decreased range of motion. No back pain.  Psych:  No change in mood or affect. No depression or anxiety. No memory loss.  Neuro: no localizing neurological complaints, no tingling, no weakness, no double vision, no gait abnormality, no slurred speech, no confusion  All systems reviewed and apart from HOPI all are negative  Past Medical History:  History reviewed. No pertinent past medical history.    History reviewed. No pertinent surgical  history.  Social History:  Ambulatory  Independently     has no tobacco, alcohol, and drug history on file.     Family History:  Family History  Problem Relation Age of Onset  . Prostate cancer Father   . Prostate cancer Brother   . CAD Neg Hx     Allergies: No Known Allergies   Prior to Admission medications   Medication Sig Start Date End Date Taking? Authorizing Provider  Cholecalciferol (VITAMIN D3) 25 MCG (1000 UT) CAPS Take 1,000 Units by mouth daily.   Yes [provider]  vitamin E 400 UNIT capsule Take 400 Units by mouth daily.   Yes [provider]   Physical Exam: Blood pressure (!) 171/92, pulse 95, temperature 97.7 F (36.5 C), temperature source Oral, resp. rate (!) 28, height 5\' 6"  (1.676 m), weight 72.6 kg, SpO2 95 %. 1. General:  in No Acute distress   Chronically ill  -appearing 2. Psychological: Alert but not Oriented 3. Head/ENT:     Dry Mucous Membranes                          Head Non traumatic, neck supple                           Poor Dentition 4. SKIN: decreased Skin turgor,  Skin clean Dry and intact no rash 5. Heart: Regular rate and rhythm systolic Murmur, no Rub or gallop 6. Lungs:  Clear to auscultation bilaterally, no wheezes or crackles   7. Abdomen: Soft,  non-tender,   distended  sounds present 8. Lower extremities: no clubbing, cyanosis, or  edema 9. Neurologically diminished strength bilaterally in lower extremities 10. MSK: Normal range of motion   LABS:     Recent Labs  Lab 07/24/18 1950  WBC 10.1  NEUTROABS 8.7*  HGB 17.4*  HCT 54.5*  MCV 92.7  PLT 239   Basic Metabolic Panel: Recent Labs  Lab 07/24/18 1950  NA 143  K 3.9  CL 107  CO2 22  GLUCOSE 132*  BUN 63*  CREATININE 1.06*  CALCIUM 9.8      Recent Labs  Lab 07/24/18 1950  AST 22  ALT 18  ALKPHOS 67  BILITOT 1.2  PROT 9.2*  ALBUMIN 3.8   No results for input(s): LIPASE, AMYLASE in the last 168 hours. No results for  input(s): AMMONIA in the last 168 hours.    HbA1C: No results for input(s): HGBA1C in the last 72 hours. CBG: Recent Labs  Lab 07/24/18 2003  GLUCAP 82      Urine analysis: No results found for: COLORURINE, APPEARANCEUR, LABSPEC, PHURINE, GLUCOSEU, HGBUR, BILIRUBINUR, KETONESUR, PROTEINUR, UROBILINOGEN, NITRITE, LEUKOCYTESUR     Cultures: No results found for: SDES, SPECREQUEST, CULT, REPTSTATUS   Radiological Exams on Admission: Dg Chest Port 1 View  Result Date: 07/24/2018 CLINICAL DATA:  Weakness EXAM: PORTABLE CHEST 1 VIEW COMPARISON:  11/21/2012 FINDINGS: New elevation of the right hemidiaphragm. Accentuated fullness in the AP window region compared to prior. Atherosclerotic calcification of the aortic arch. IMPRESSION: 1. New elevation of the right hemidiaphragm. 2. Accentuated fullness in the AP window region compared to prior, cannot exclude adenopathy or mass. Consider chest CT for further evaluation. Electronically Signed   By: Gaylyn RongWalter  Liebkemann M.D.   On: 07/24/2018 20:34    Chart has been reviewed    Assessment/Plan  72 y.o. female with medical history significant of dementia       Admitted for acute UTI dehydration  Bilateral PE  Present on Admission: . Pulmonary emboli (HCC) -  Admit to  telemetry   Initiate heparin drip  Would likely benefit from case manager consult for long term anticoagulation Hold home blood pressure medications avoid hypotension Cycle cardiac enzymes Order echogram and lower extremity Dopplers  Most likely risk factors for hypercoagulable state being  Unknown but possibly being immpbile   . Multifocal pneumonia -  - will admit for treatment of CAP will start on appropriate antibiotic coverage.   Obtain:  sputum cultures,                                    blood cultures if febrile or if decompensates.                   strep pneumo UA antigen,                Provide oxygen as needed.   . Elevated troponin - in the setting of  PE has been trending down  . Acute metabolic encephalopathy - CT head unremarkable, hx of mild Dementia at baseline likely worsening in the setting of dehydration and infections process . Dementia (HCC)- mild but may developed delirium while hospitalize. . Dehydration - will rehydrate and monitor fluid status  . UTI (urinary tract infection) -  - treat with Rocephin        await results of urine culture and adjust antibiotic coverage as needed   Bilateral leg weakness - unclear duration or cause CT abd non-acute Given urinary incontinence and leg weakness will obtain MRI of Lumbar spine Once DVT has been ruled out would benefit from PT /OT evaluation   Other plan as per orders.  DVT prophylaxis:    Lovenox     Code Status:  FULL CODE as per patient and   family     Family Communication:   Family at  Bedside  plan of care was discussed with  Brother   Disposition Plan:    likely will need placement for rehabilitation                                              Would benefit from PT/OT eval prior to DC  Ordered                   Swallow eval - SLP ordered                   Social Work  consulted                   Nutrition    consulted                  Wound care  consulted                   Palliative care    consulted                   Behavioral health  consulted                    Consults called:     Admission status:   Obs    Level of care     tele  For  24H        Gizel Riedlinger 07/25/2018, 3:37 AM    [facility]  Pager [phone]   after 2 AM please page floor coverage PA If 7AM-7PM, please contact the day team taking care of the patient  [email or web address]  Password [email or web address]

## 2018-07-25 NOTE — Progress Notes (Signed)
Patient ID: Jene EveryLucille Stoffer, female   DOB: [birth year], 72 y.o.   MRN: [id number]  I was called by Dr. Sunnie Nielsenegalado at [phone] requesting that I see patient Ms. Wolbert.  Patient was admitted for PE, DVT on heparin drip, multifocal pneumonia, UTI, to mental status.  While she was in MRI with radiologist contacted the daytime hospitalist to inform her of a in intracranial hemorrhage.  Heparin drip was stopped at that time.  Patient was transferred to ICU.  I saw the patient in the ICU and ordered O2. Per the sitter who was with her during the day she has become increasingly lethargic over the course of the day. Other history unable to be obtained due to altered mental status.   PHYSICAL EXAMINATION: VITAL SIGNS: Blood pressure (!) 174/89, pulse (!) 120, temperature 98.8 F (37.1 C), resp. rate (!) 30, height 5\' 6"  (1.676 m), weight 72.6 kg, SpO2 100 %.  GENERAL: 72 y.o.-year-old African-American female patient, well-developed, well-nourished lying in the bed in no acute distress.   HEENT: Head atraumatic, normocephalic. Pupils unequal, L>R, fixed.  NECK: Supple CHEST: Normal breath sounds bilaterally. No wheezing, rales, rhonchi or crackles. No use of accessory muscles of respiration.  CARDIOVASCULAR: S1, S2 normal. No murmurs, rubs, or gallops. Cap refill <2 seconds. ABDOMEN: Soft, nondistended. No rebound, guarding, rigidity. Normoactive bowel sounds present in all four quadrants. No organomegaly or mass. EXTREMITIES: No pedal edema, erythema. NEUROLOGIC: She is minimally responsive, withdraws to painful stimuli.  Unresponsive to verbal or tactile stimuli.   MRI results: IMPRESSION: 1. New large areas of hemorrhage in the anterior left frontal lobe and posterior left frontal and parietal lobe resulting in significant mass effect with left-to-right midline shift up to 10 mm. 2. Smaller area of hemorrhage in the anterior inferior right frontal lobe. 3. Intraventricular hemorrhage with fluid levels in  the posterior horns of both lateral ventricles without hydrocephalus.   A/P: This is a 72 year old female with PE, DVT, multifocal pneumonia, urinary tract infection and altered mental status who was found to have significant intracranial hemorrhage in the anterior left and right frontal lobe, superior left frontal lobe and parietal lobe resulting in significant mass-effect as well as intraventricular hemorrhage.   Dr. Sunnie Nielsenegalado had contacted critical care who saw the patient in the ICU, ordered mannitol and IV Cardene drip.  Heparin drip has been stopped.  She had also contacted neurosurgery who [facility]  Critical care has contacted anesthesia for intubation via transfer.  [facility] under the critical care service of Dr. Warrick Parisiangan.  Family to be contacted by CCM.  Hospitalist has been notified and updated of the events.

## 2018-07-26 ENCOUNTER — Inpatient Hospital Stay (HOSPITAL_COMMUNITY): Payer: Medicare Other

## 2018-07-26 DIAGNOSIS — J189 Pneumonia, unspecified organism: Secondary | ICD-10-CM

## 2018-07-26 DIAGNOSIS — N390 Urinary tract infection, site not specified: Secondary | ICD-10-CM

## 2018-07-26 DIAGNOSIS — I10 Essential (primary) hypertension: Secondary | ICD-10-CM

## 2018-07-26 DIAGNOSIS — F0390 Unspecified dementia without behavioral disturbance: Secondary | ICD-10-CM

## 2018-07-26 LAB — TRIGLYCERIDES
TRIGLYCERIDES: 16 mg/dL (ref <150)
Triglycerides: 119 mg/dL (ref <150)

## 2018-07-26 LAB — POCT I-STAT 3, ART BLOOD GAS (G3+)
ACID-BASE DEFICIT: 6 mmol/L — AB (ref 0.0–2.0)
BICARBONATE: 16.8 mmol/L — AB (ref 20.0–28.0)
O2 Saturation: 99 %
PCO2 ART: 21.3 mmHg — AB (ref 32.0–48.0)
PH ART: 7.506 — AB (ref 7.350–7.450)
TCO2: 17 mmol/L — ABNORMAL LOW (ref 22–32)
pO2, Arterial: 116 mmHg — ABNORMAL HIGH (ref 83.0–108.0)

## 2018-07-26 LAB — SODIUM
SODIUM: 153 mmol/L — AB (ref 135–145)
SODIUM: 152 mmol/L — AB (ref 135–145)

## 2018-07-26 LAB — BASIC METABOLIC PANEL
Anion gap: 7 (ref 5–15)
BUN: 16 mg/dL (ref 8–23)
CHLORIDE: 125 mmol/L — AB (ref 98–111)
CO2: 21 mmol/L — AB (ref 22–32)
Calcium: 8.9 mg/dL (ref 8.9–10.3)
Creatinine, Ser: 0.61 mg/dL (ref 0.44–1.00)
GFR calc non Af Amer: >60 mL/min (ref >60)
Glucose, Bld: 144 mg/dL — ABNORMAL HIGH (ref 70–99)
Potassium: 3.2 mmol/L — ABNORMAL LOW (ref 3.5–5.1)
SODIUM: 153 mmol/L — AB (ref 135–145)

## 2018-07-26 LAB — MRSA PCR SCREENING: MRSA BY PCR: NEGATIVE

## 2018-07-26 MED ORDER — CHLORHEXIDINE GLUCONATE 0.12% ORAL RINSE (MEDLINE KIT)
15.0000 mL | Freq: Two times a day (BID) | OROMUCOSAL | Status: DC
  Administered 2018-07-26 – 2018-07-30 (×9): 15 mL via OROMUCOSAL

## 2018-07-26 MED ORDER — SODIUM CHLORIDE 3 % IV SOLN
INTRAVENOUS | Status: DC
  Administered 2018-07-26: 50 mL/h via INTRAVENOUS
  Filled 2018-07-26: qty 500

## 2018-07-26 MED ORDER — POTASSIUM CHLORIDE 10 MEQ/100ML IV SOLN
10.0000 meq | INTRAVENOUS | Status: AC
  Administered 2018-07-26 (×4): 10 meq via INTRAVENOUS
  Filled 2018-07-26 (×4): qty 100

## 2018-07-26 MED ORDER — ORAL CARE MOUTH RINSE
15.0000 mL | OROMUCOSAL | Status: DC
  Administered 2018-07-26 – 2018-07-30 (×46): 15 mL via OROMUCOSAL

## 2018-07-26 MED ORDER — FAMOTIDINE IN NACL 20-0.9 MG/50ML-% IV SOLN
20.0000 mg | INTRAVENOUS | Status: DC
  Administered 2018-07-26 – 2018-07-27 (×2): 20 mg via INTRAVENOUS
  Filled 2018-07-26 (×2): qty 50

## 2018-07-26 MED ORDER — K PHOS MONO-SOD PHOS DI & MONO 155-852-130 MG PO TABS
500.0000 mg | ORAL_TABLET | Freq: Three times a day (TID) | ORAL | Status: DC
  Filled 2018-07-26: qty 2

## 2018-07-26 MED ORDER — POTASSIUM CHLORIDE 10 MEQ/50ML IV SOLN: 10.0000 meq | INTRAVENOUS | Status: DC

## 2018-07-26 MED ORDER — SODIUM CHLORIDE 3 % IV SOLN
INTRAVENOUS | Status: AC
  Administered 2018-07-26: 75 mL/h via INTRAVENOUS
  Administered 2018-07-27: 40 mL/h via INTRAVENOUS
  Filled 2018-07-26 (×6): qty 500

## 2018-07-26 MED ORDER — METOPROLOL TARTRATE 5 MG/5ML IV SOLN
5.0000 mg | Freq: Once | INTRAVENOUS | Status: AC
  Administered 2018-07-26: 5 mg via INTRAVENOUS
  Filled 2018-07-26: qty 5

## 2018-07-26 MED ORDER — CLEVIDIPINE BUTYRATE 0.5 MG/ML IV EMUL
0.0000 mg/h | INTRAVENOUS | Status: DC
  Administered 2018-07-26: 1 mg/h via INTRAVENOUS
  Administered 2018-07-26: 2 mg/h via INTRAVENOUS
  Administered 2018-07-27: 12 mg/h via INTRAVENOUS
  Administered 2018-07-27: 16 mg/h via INTRAVENOUS
  Administered 2018-07-27: 7 mg/h via INTRAVENOUS
  Administered 2018-07-27: 12 mg/h via INTRAVENOUS
  Administered 2018-07-27 – 2018-07-28 (×2): 7 mg/h via INTRAVENOUS
  Administered 2018-07-28: 14 mg/h via INTRAVENOUS
  Administered 2018-07-28: 7 mg/h via INTRAVENOUS
  Administered 2018-07-28: 10 mg/h via INTRAVENOUS
  Administered 2018-07-28: 12 mg/h via INTRAVENOUS
  Filled 2018-07-26 (×13): qty 50

## 2018-07-26 MED ORDER — SODIUM CHLORIDE 0.9 % IV SOLN
INTRAVENOUS | Status: DC | PRN
  Administered 2018-07-26: 250 mL via INTRAVENOUS
  Administered 2018-07-27 (×2): 500 mL via INTRAVENOUS

## 2018-07-26 NOTE — Progress Notes (Signed)
STROKE TEAM PROGRESS NOTE   SUBJECTIVE (INTERVAL HISTORY) Her sister-in-law and RN are at the bedside.  Patient intubated, not on sedation, not open eyes on voice but slightly open eyes on pain.  Able to localize to pain in all extremities.  However, left pupil dilated with very sluggish pupillary reflex at this moment.  Dr. Venetia Maxon from neurosurgery saw her last night and recommended against surgery intervention.  Patient brothers are neurosurgeon and orthopedic surgeon, and as per sister-in-law they would like to continue aggressive medical care.  Sister-in-law also called patient son in [place] and ask him to come back ASAP to make medical decisions.  I tried her son Hannah Holloway's number at [phone] and 30 to voicemail and I left a message for him to call back ASAP.    OBJECTIVE Vitals:   07/26/18 0400 07/26/18 0500 07/26/18 0600 07/26/18 0700  BP: 122/69 122/64 111/64 113/67  Pulse: (!) 103 95 99 93  Resp: 20 (!) 21 (!) 22 (!) 21  Temp:      TempSrc:      SpO2: 100% 100% 100% 100%  Weight:      Height:        CBC:  Recent Labs  Lab 07/24/18 1950 07/25/18 0528  WBC 10.1 11.1*  NEUTROABS 8.7*  --   HGB 17.4* 14.3  HCT 54.5* 43.6  MCV 92.7 92.6  PLT 239 188    Basic Metabolic Panel:  Recent Labs  Lab 07/24/18 1950 07/25/18 0528 07/25/18 2019 07/26/18 0200  NA 143 147* 144 153*  K 3.9 3.9  --   --   CL 107 114*  --   --   CO2 22 21*  --   --   GLUCOSE 132* 128*  --   --   BUN 63* 41*  --   --   CREATININE 1.06* 0.68  --   --   CALCIUM 9.8 9.4  --   --   MG  --  2.6*  --   --   PHOS  --  1.9*  --   --     Lipid Panel:     Component Value Date/Time   TRIG 119 07/25/2018 2019   HgbA1c:  Lab Results  Component Value Date   HGBA1C 5.7 (H) 07/25/2018   Urine Drug Screen: No results found for: LABOPIA, COCAINSCRNUR, LABBENZ, AMPHETMU, THCU, LABBARB  Alcohol Level No results found for: ETH  IMAGING   Ct Head Wo Contrast  Result Date: 07/26/2018 CLINICAL  DATA:  72 y/o  F; acute hemorrhage for follow-up. EXAM: CT HEAD WITHOUT CONTRAST TECHNIQUE: Contiguous axial images were obtained from the base of the skull through the vertex without intravenous contrast. COMPARISON:  07/25/2018 CT head.  07/25/2018 MRI head. FINDINGS: Brain: Stable left frontal hemorrhage measuring 4.5 x 3.9 x 3.9 cm (AP x ML x CC series 3, image 14 and series 5, image 16). Stable right anterior frontal hematoma measuring 21 mm (series 3, image 15). Stable left frontoparietal hemorrhage measuring 3.4 x 5.0 x 3.4 cm (AP x ML x CC series 5, image 45 and series 3, image 17). Stable 11 mm hemorrhage within the left lateral frontal lobe (series 3, image 17). Stable intraventricular hemorrhage pooling in the occipital horns of the lateral ventricles. Stable subarachnoid hemorrhage predominantly in the left greater than right sylvian fissures and along the right anterior frontal lobe. Stable thin subdural hematoma along the left tentorium cerebelli. Stable 10 mm left-to-right midline shift of the anterior septum pellucidum.  No new stroke, hemorrhage, focal mass effect, or herniation. Vascular: Calcific atherosclerosis of the carotid siphons. No hyperdense vessel identified. Skull: Normal. Negative for fracture or focal lesion. Sinuses/Orbits: No acute finding. Other: None. IMPRESSION: In comparison with the prior MRI of the head given differences in technique: 1. Stable acute hemorrhages within the left frontal lobe, right frontal lobe, left frontal parietal junction, and left lateral frontal lobe. 2. Stable intraventricular hemorrhage, thin subdural hematoma along the left tentorium, and small volume subarachnoid hemorrhage. 3. Stable 10 mm left-to-right midline shift of anterior septum pellucidum. 4. No new acute intracranial process identified. Electronically Signed   By: Mitzi HansenLance  Furusawa-Stratton M.D.   On: 07/26/2018 04:54   Ct Head Wo Contrast  Result Date: 07/25/2018 CLINICAL DATA:  Altered  mental status EXAM: CT HEAD WITHOUT CONTRAST TECHNIQUE: Contiguous axial images were obtained from the base of the skull through the vertex without intravenous contrast. COMPARISON:  None. FINDINGS: Brain: No subdural, epidural, or subarachnoid hemorrhage. Cerebellum, brainstem, and basal cisterns are normal. Ventricles and sulci are mildly prominent. No acute cortical ischemia or infarct. No mass effect or midline shift. Vascular: Calcified atherosclerosis in the intracranial carotids. Skull: Normal. Negative for fracture or focal lesion. Sinuses/Orbits: No acute finding. Other: None. IMPRESSION: No acute intracranial abnormalities identified. Electronically Signed   By: Gerome Samavid  Williams III M.D   On: 07/25/2018 00:53   Ct Angio Chest Pe W Or Wo Contrast  Result Date: 07/25/2018 CLINICAL DATA:  Weakness. Question diverticulitis. Elevated white blood cell count. EXAM: CT ANGIOGRAPHY CHEST CT ABDOMEN AND PELVIS WITH CONTRAST TECHNIQUE: Multidetector CT imaging of the chest was performed using the standard protocol during bolus administration of intravenous contrast. Multiplanar CT image reconstructions and MIPs were obtained to evaluate the vascular anatomy. Multidetector CT imaging of the abdomen and pelvis was performed using the standard protocol during bolus administration of intravenous contrast. CONTRAST:  100mL ISOVUE-370 IOPAMIDOL (ISOVUE-370) INJECTION 76% COMPARISON:  Chest x-ray July 24, 2018 FINDINGS: CTA CHEST FINDINGS Cardiovascular: The thoracic aorta demonstrates atherosclerosis with no aneurysm or dissection. The heart size is normal. Evaluation of the pulmonary arteries is limited due to significant stairstep artifact from respiratory motion. Pulmonary emboli are seen in left upper lobe pulmonary arteries such as on series 7, image 140 and coronal images 56 and 53. Evaluation of right-sided pulmonary arteries is limited due to streak artifact off of the SVC. That being said, there appear to  be emboli in the right upper lobe pulmonary arterial branchesa. Significant artifact limits evaluation of the lower lobe branches. Mediastinum/Nodes: The thyroid and esophagus are normal. No adenopathy or effusion. Lungs/Pleura: Infiltrates seen in the right upper lobe, the superior segment of the right lower lobe, and the left lower lobe. Central airways are normal. No pneumothorax. No suspicious nodules or masses. Musculoskeletal: See below. Review of the MIP images confirms the above findings. CT ABDOMEN and PELVIS FINDINGS Hepatobiliary: Probable small hepatic cysts, too small to characterize. No suspicious masses. The portal vein is unremarkable. The gallbladder is unremarkable. Pancreas: Unremarkable. No pancreatic ductal dilatation or surrounding inflammatory changes. Spleen: Normal in size without focal abnormality. Adrenals/Urinary Tract: Adrenal glands are normal. Probable tiny cyst in the right kidney, too small to characterize. No suspicious masses or hydronephrosis. No filling defects in the upper collecting systems on delayed images. No ureteral stones are noted. The bladder is distended but otherwise unremarkable. Stomach/Bowel: The stomach and small bowel are normal. Colonic diverticulosis is seen without diverticulitis. The colon is otherwise normal. Limited views  of the appendix are normal. Vascular/Lymphatic: Atherosclerotic changes are seen in the nonaneurysmal aorta. No adenopathy. Reproductive: The uterus and ovaries are normal. Other: No free air or free fluid. Increased attenuation in the fat adjacent to the coccyx as seen on axial image 82. Musculoskeletal: Degenerative changes are seen in the hips bilaterally. No acute bony abnormalities. Review of the MIP images confirms the above findings. IMPRESSION: 1. Pulmonary emboli in the upper lobes. Evaluation of lower lobe branches is severely limited but no definitive lower lobe emboli are noted. 2. Multifocal infiltrates, likely pneumonia.  Recommend follow-up to resolution. 3. Coronary artery calcifications. Atherosclerotic changes in the aorta. 4. Colonic diverticulosis without diverticulitis. 5. Fat stranding adjacent to the coccyx on axial image 82 is nonspecific. Findings called to Dr. Read Drivers. Electronically Signed   By: Gerome Sam III M.D   On: 07/25/2018 02:49   Mr Brain Wo Contrast  Result Date: 07/25/2018 CLINICAL DATA:  Acute mental status changes. Pulmonary embolus and DVT. EXAM: MRI HEAD WITHOUT CONTRAST TECHNIQUE: Multiplanar, multiecho pulse sequences of the brain and surrounding structures were obtained without intravenous contrast. COMPARISON:  CT head without contrast 07/25/2018 12:25 a.m. FINDINGS: Brain: Multifocal areas of acute hemorrhage are present. Anterior left frontal lobe hemorrhage measures 4.0 x 4.5 x 4.3 Cm. A new hemorrhage in the posterior left frontal and left parietal lobe measures 5.5 x 3.0 x 3.5 cm. A smaller area of hemorrhage in the anterior inferior right frontal lobe measures 2.4 cm. Left right midline shift is 10 mm. There is effacement of the left lateral ventricle. Blood products are seen within the ventricles bilaterally. No hydrocephalus is evident. Posterior fossa is unremarkable. No acute parenchymal infarct is present otherwise. Vascular: Flow is present in the major intracranial arteries. Skull and upper cervical spine: Craniocervical junction is normal. Upper cervical spine is normal. Sinuses/Orbits: The left sphenoid sinus is partially opacified. No other significant paranasal sinus disease is present. Mastoid air cells are clear. Globes and orbits are within normal limits. IMPRESSION: 1. New large areas of hemorrhage in the anterior left frontal lobe and posterior left frontal and parietal lobe resulting in significant mass effect with left-to-right midline shift up to 10 mm. 2. Smaller area of hemorrhage in the anterior inferior right frontal lobe. 3. Intraventricular hemorrhage with fluid  levels in the posterior horns of both lateral ventricles without hydrocephalus. Critical Value/emergent results were called by telephone at the time of interpretation on 07/25/2018 at 6:39 pm to Dr. Hartley Barefoot , who verbally acknowledged these results. Electronically Signed   By: Marin Roberts M.D.   On: 07/25/2018 19:01   Mr Lumbar Spine W Wo Contrast  Result Date: 07/25/2018 CLINICAL DATA:  Generalized weakness for 3 days EXAM: MRI LUMBAR SPINE WITHOUT AND WITH CONTRAST TECHNIQUE: Multiplanar and multiecho pulse sequences of the lumbar spine were obtained without and with intravenous contrast. CONTRAST:  7 cc Gadavist intravenous COMPARISON:  None. FINDINGS: Segmentation:  5 lumbar type vertebral bodies Alignment:  Normal Vertebrae: No fracture, evidence of discitis, or aggressive bone lesion. Presumed bone island in the L1 body. Sacral meningeal cyst with prominent scalloping at the level of the S2 and S3 bodies, with imperceptible cortex at some levels. Conus medullaris and cauda equina: Conus extends to the L1-2 level. Conus and cauda equina appear normal. Specifically the nerve roots are not thickened or enhancing for Guillain-Barre. There is dependent lobulated material within the thecal sac at L5 and below, which is nonenhancing. Paraspinal and other soft tissues: Full urinary  bladder. Sigmoid diverticulosis. Disc levels: Good disc height and hydration for age. Mild generalized facet spurring. No impingement. IMPRESSION: 1. No definitive cause of weakness. The cauda equina conus has a normal appearance and there is no compressive stenosis. 2. Unusual material in the lower and dependent thecal sac which could be debris or scarring related to notably expansile sacral Tarlov cysts. Please correlate for trauma history or meningitis symptoms. Lumbar puncture may be helpful, although complicated by acute PE. 3. History of incontinence. There is moderate distension of the urinary bladder-consider  checking postvoid residual. Electronically Signed   By: Marnee Spring M.D.   On: 07/25/2018 07:13   Ct Abdomen Pelvis W Contrast  Result Date: 07/25/2018 CLINICAL DATA:  Weakness. Question diverticulitis. Elevated white blood cell count. EXAM: CT ANGIOGRAPHY CHEST CT ABDOMEN AND PELVIS WITH CONTRAST TECHNIQUE: Multidetector CT imaging of the chest was performed using the standard protocol during bolus administration of intravenous contrast. Multiplanar CT image reconstructions and MIPs were obtained to evaluate the vascular anatomy. Multidetector CT imaging of the abdomen and pelvis was performed using the standard protocol during bolus administration of intravenous contrast. CONTRAST:  ISOVUE-370 IOPAMIDOL (ISOVUE-370) INJECTION 76% COMPARISON:  Chest x-ray July 24, 2018 FINDINGS: CTA CHEST FINDINGS Cardiovascular: The thoracic aorta demonstrates atherosclerosis with no aneurysm or dissection. The heart size is normal. Evaluation of the pulmonary arteries is limited due to significant stairstep artifact from respiratory motion. Pulmonary emboli are seen in left upper lobe pulmonary arteries such as on series 7, image 140 and coronal images 56 and 53. Evaluation of right-sided pulmonary arteries is limited due to streak artifact off of the SVC. That being said, there appear to be emboli in the right upper lobe pulmonary arterial branchesa. Significant artifact limits evaluation of the lower lobe branches. Mediastinum/Nodes: The thyroid and esophagus are normal. No adenopathy or effusion. Lungs/Pleura: Infiltrates seen in the right upper lobe, the superior segment of the right lower lobe, and the left lower lobe. Central airways are normal. No pneumothorax. No suspicious nodules or masses. Musculoskeletal: See below. Review of the MIP images confirms the above findings. CT ABDOMEN and PELVIS FINDINGS Hepatobiliary: Probable small hepatic cysts, too small to characterize. No suspicious masses. The  portal vein is unremarkable. The gallbladder is unremarkable. Pancreas: Unremarkable. No pancreatic ductal dilatation or surrounding inflammatory changes. Spleen: Normal in size without focal abnormality. Adrenals/Urinary Tract: Adrenal glands are normal. Probable tiny cyst in the right kidney, too small to characterize. No suspicious masses or hydronephrosis. No filling defects in the upper collecting systems on delayed images. No ureteral stones are noted. The bladder is distended but otherwise unremarkable. Stomach/Bowel: The stomach and small bowel are normal. Colonic diverticulosis is seen without diverticulitis. The colon is otherwise normal. Limited views of the appendix are normal. Vascular/Lymphatic: Atherosclerotic changes are seen in the nonaneurysmal aorta. No adenopathy. Reproductive: The uterus and ovaries are normal. Other: No free air or free fluid. Increased attenuation in the fat adjacent to the coccyx as seen on axial image 82. Musculoskeletal: Degenerative changes are seen in the hips bilaterally. No acute bony abnormalities. Review of the MIP images confirms the above findings. IMPRESSION: 1. Pulmonary emboli in the upper lobes. Evaluation of lower lobe branches is severely limited but no definitive lower lobe emboli are noted. 2. Multifocal infiltrates, likely pneumonia. Recommend follow-up to resolution. 3. Coronary artery calcifications. Atherosclerotic changes in the aorta. 4. Colonic diverticulosis without diverticulitis. 5. Fat stranding adjacent to the coccyx on axial image 82 is nonspecific. Findings  called to Dr. Read Drivers. Electronically Signed   By: Gerome Sam III M.D   On: 07/25/2018 02:49   Dg Chest Port 1 View  Result Date: 07/25/2018 CLINICAL DATA:  Status post intubation EXAM: PORTABLE CHEST 1 VIEW COMPARISON:  CT 07/25/2018 FINDINGS: Endotracheal tube with tip 4 cm from carina. Normal cardiac silhouette. Aorta is ectatic. Lungs are clear. IMPRESSION: Endotracheal tube in  good position. Electronically Signed   By: Genevive Bi M.D.   On: 07/25/2018 20:49   Dg Chest Port 1 View  Result Date: 07/24/2018 CLINICAL DATA:  Weakness EXAM: PORTABLE CHEST 1 VIEW COMPARISON:  11/21/2012 FINDINGS: New elevation of the right hemidiaphragm. Accentuated fullness in the AP window region compared to prior. Atherosclerotic calcification of the aortic arch. IMPRESSION: 1. New elevation of the right hemidiaphragm. 2. Accentuated fullness in the AP window region compared to prior, cannot exclude adenopathy or mass. Consider chest CT for further evaluation. Electronically Signed   By: Gaylyn Rong M.D.   On: 07/24/2018 20:34   Vas [place] Lower Extremity Venous (dvt)  Result Date: 07/25/2018  Lower Venous Study Indications: SOB, and pulmonary embolism.  Risk Factors: Confirmed PE. Performing Technologist: Toma Deiters RVS  Examination Guidelines: A complete evaluation includes B-mode imaging, spectral Doppler, color Doppler, and power Doppler as needed of all accessible portions of each vessel. Bilateral testing is considered an integral part of a complete examination. Limited examinations for reoccurring indications may be performed as noted.  Right Venous Findings: +---------+---------------+---------+-----------+----------+------------------+          CompressibilityPhasicitySpontaneityPropertiesSummary            +---------+---------------+---------+-----------+----------+------------------+ CFV      Full           Yes      Yes                                     +---------+---------------+---------+-----------+----------+------------------+ SFJ      Full                                                            +---------+---------------+---------+-----------+----------+------------------+ FV Prox  Full           Yes      Yes                                     +---------+---------------+---------+-----------+----------+------------------+ FV Mid    Full                                                            +---------+---------------+---------+-----------+----------+------------------+ FV DistalFull           Yes      Yes                                     +---------+---------------+---------+-----------+----------+------------------+ PFV      Full  Yes      Yes                                     +---------+---------------+---------+-----------+----------+------------------+ POP      Partial        Yes      Yes                                     +---------+---------------+---------+-----------+----------+------------------+ PTV      Partial                                      Acute mid to                                                             proximal           +---------+---------------+---------+-----------+----------+------------------+ PERO                                                  Difficult to                                                             visualize          +---------+---------------+---------+-----------+----------+------------------+ Gastroc  Partial                                      Acute              +---------+---------------+---------+-----------+----------+------------------+  Right Technical Findings: Difficult to position the leg for distal imaging due to dementia and patient poor to repond.  Left Venous Findings: +---------+---------------+---------+-----------+----------+-------------+          CompressibilityPhasicitySpontaneityPropertiesSummary       +---------+---------------+---------+-----------+----------+-------------+ CFV      Full           Yes      Yes                                +---------+---------------+---------+-----------+----------+-------------+ SFJ      Full                                                       +---------+---------------+---------+-----------+----------+-------------+ FV Prox   Full           Yes      Yes                                +---------+---------------+---------+-----------+----------+-------------+  FV Mid   Full                                                       +---------+---------------+---------+-----------+----------+-------------+ FV DistalFull           Yes      Yes                                +---------+---------------+---------+-----------+----------+-------------+ PFV      Full           Yes      Yes                                +---------+---------------+---------+-----------+----------+-------------+ POP      Partial                                      Mid to distal +---------+---------------+---------+-----------+----------+-------------+ PTV      None                                                       +---------+---------------+---------+-----------+----------+-------------+ PERO     None                                                       +---------+---------------+---------+-----------+----------+-------------+  Left Technical Findings: Difficult to positin the lef for distal imaging due to dementia and patient poorto respond. Rouleux flow was noted in the proxiaml popliteal vein.   Summary: Right: Findings consistent with acute deep vein thrombosis involving the right gastrocnemius vein, right posterior tibial vein, and right popliteal vein. See technical findings listed above Left: Findings consistent with acute deep vein thrombosis involving the left peroneal vein, left posterior tibial vein, and left popliteal vein. See technical findings listed above  *See table(s) above for measurements and observations. Electronically signed by Sherald Hess MD on 07/25/2018 at 3:36:49 PM.    Final     Transthoracic Echocardiogram  Normal LV size with EF 65-70%. Normal RV size and systolic   function. Flattened mitral valve closure plane without frank   prolapse, mild to moderate mitral regurgitation.  Mild pulmonary   hypertension.    PHYSICAL EXAM  Temp:  [98.2 F (36.8 C)-100.4 F (38 C)] 98.4 F (36.9 C) (11/22 0824) Pulse Rate:  [56-126] 88 (11/22 0724) Resp:  [15-35] 21 (11/22 0724) BP: (110-220)/(53-136) 124/53 (11/22 0724) SpO2:  [91 %-100 %] 100 % (11/22 0724) Arterial Line BP: (128-134)/(53-59) 128/59 (11/22 0700) FiO2 (%):  [30 %-100 %] 30 % (11/22 0724)  General - Well nourished, well developed, intubated.  Ophthalmologic - fundi not visualized due to noncooperation.  Cardiovascular - Regular rhythm, but tachycardia.  Neuro -intubated not on sedation, eyes closed, not open on voice but slightly open on pain stimulation.  Not following any commands.  Left  pupil 3.5 mm, very sluggish to light, right pupil 2 mm, very sluggish to light.  Left gaze preference, no doll's eyes, left corneal weak but present, right corneal absent.  Gag and cough reflex present.  On pain summation both upper extremity initially extend but then withdraw to pain and localizing to pain.  Both lower extremity withdraws to pain 2/5 muscle strength.  DTR 1+, bilateral Babinski positive. Sensation, coordination and gait not tested.   ASSESSMENT/PLAN Hannah Holloway is a 72 y.o. female with history of dementia presenting with generalized weakness, found down at home, soaked in urine.  She was admitted to [facility] and found to have bilateral PE on chest CT and bilateral DVT and lower extremities.  She was put on heparin IV but then developed worsening mental status for which MRI was done showed left frontal anterior and posterior ICH with small right frontal ICH and IVH.  Patient transferred to [facility] for further evaluation.   ICH: Left anterior and posterior frontal large hematoma, right anterior frontal small hematoma, and IVH bilaterally.  Heparin was not reversed given PE and DVT.  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  Resultant unresponsive, intubated  CT head 07/25/2018 no acute abnormality  MRI head showed left anterior and posterior frontal large hematoma, right anterior frontal small hematoma and IVH. However, SWI sequence was not able to obtain due to noncooperation.  CT head repeat 07/26/18 confirmed ICH and IVH as above  Neurosurgery consulted, Dr. Venetia Maxon recommended against surgical intervention.  2D Echo - EF 65 to 70%  HgbA1c 5.7  VTE prophylaxis - SCDs  No antithrombotic prior to admission, was treated with heparin IV for PE/DVT, now on No antithrombotic given ICH.  Ongoing aggressive stroke risk factor management  Therapy recommendations:  pending  Disposition:  Pending  Poor prognosis and need to discuss about treatment plan and goals of care.  Cerebral edema and uncal herniation  CT and MRI showed midline shift 10 mm  Currently, left pupil dilated and near fixed  Mannitol 1 dose given yesterday  Currently on 3% saline - continue  Sodium 144->153  Sodium goal 150 - 160  Sodium check every 6 hours  Patient brothers Hannah Holloway and Hannah Holloway are neurosurgeon and orthopedic surgeon and so far would like to continue aggressive medical management  Patient son Hannah Holloway is living in [place], patient sister-in-law has told him to ask him to come ASAP.  I have tried to contact Hannah Holloway twice this morning on the number of [phone] and has left voicemail for him to call back.  Bilateral PE and DVT  Patient found down at home, soaked in urine  Chest CT showed bilateral upper lobe PE  LE venous Doppler showed bilateral DVT  Was on heparin IV which has been discontinued due to ICH  Sepsis  CT chest showed multifocal pneumonia  UA WBC 21-50  Urine culture showed gram-negative rods  Leukocytosis with WBC 11.1  Currently on azithromycin, Rocephin  Hypertension  Stable on cardene . BP goal less than 140 . Long-term BP goal normotensive  Other Stroke Risk  Factors  Advanced age  Other Active Problems  Baseline dementia    Hospital day # 1  This patient is critically ill due to extensive ICH, IVH, bilateral PE/DVT, cerebral edema and uncal herniation, sepsis with pneumonia and UTI and at significant risk of neurological worsening, death form uncal herniation, cerebral edema, heart failure, respiratory failure,  seizure and sepsis as well as brain death. This patient's care requires constant monitoring of vital signs, hemodynamics, respiratory and cardiac monitoring, review of multiple databases, neurological assessment, discussion with family, other specialists and medical decision making of high complexity. I spent 45 minutes of neurocritical care time in the care of this patient. I had long discussion with sister-in-law at bedside and her sister over the phone, updated pt current condition, treatment plan and potential prognosis. They expressed understanding and appreciation.  I attempted to contact patient's son over the phone twice but he was not available.  I left voicemail for him to call back.   Marvel Plan, MD PhD Stroke Neurology 07/26/2018 10:51 AM     To contact Stroke Continuity provider, please refer to [email or web address]. After hours, contact General Neurology

## 2018-07-26 NOTE — Progress Notes (Signed)
PT Cancellation Note  Patient Details Name: Hannah Holloway MRN: [id number] DOB: 1946-02-20   Cancelled Treatment:    Reason Eval/Treat Not Completed: Medical issues which prohibited therapy(Pt intubated, with LE DVT and PE, not anticoagulated due to large ICH). Will check on pt next week   Refugio Vandevoorde B Pilot Prindle 07/26/2018, 6:51 AM  Delaney MeigsMaija Tabor Rollin Kotowski, PT Acute Rehabilitation Services Pager: [phone] Office: [phone]

## 2018-07-26 NOTE — Progress Notes (Signed)
3% re-ordered for another 24 hours per Dr. Roda ShuttersXu. No PICC or CVC inserted given pts poor prognosis.

## 2018-07-26 NOTE — Progress Notes (Signed)
..   NAME:  Hannah Holloway, MRN:  [id number], DOB:  1946/04/13, LOS: 1 ADMISSION DATE:  07/24/2018, CONSULTATION DATE:  07/25/18 REFERRING MD:  Sunnie NielsenEGALADO, CHIEF COMPLAINT:  Altered mental status   Brief History   72 yr old female who was admitted on 07/24/18 for mgmt of Acute PE secondary to lower ext thrombosis from immobility started on Heparin gtt for anticoagulation. Experienced acute change in mental status while in MRI where pt was found to have a large left sided IPH and IVH with midline shift 10 mm.   Past Medical History  No significant PMHx .Marland Kitchen.  Significant Hospital Events   11/20: 1.  Acute PE: Pulmonary emboli are seen in left upper lobe pulmonary arteries While evaluation of right-sided pulmonary arteries is limited due to streak artifact off of the SVC there still appears to be emboli in the right upper lobe pulmonary arterial branchesa. Significant artifact limits evaluation of the lower lobe branches. Number/21: Bilateral lower extremity DVTs: Vasc US 11/21-> Findings consistent with acute deep vein thrombosis involving the right gastrocnemius vein, right posterior tibial vein, and right popliteal vein. And acute deep vein thrombosis involving the left peroneal vein, left posterior tibial vein, and left popliteal vein. 11/21: New left sided hemorrhage in the anterior left frontal lobe and posterior left frontal and parietal lobe resulting in significant mass effect with left-to-right midline shift up to 10 mm.  Smaller area of hemorrhage in the anterior inferior right frontal lobe.Along with  Intraventricular hemorrhage with fluid levels in the posterior horns of both lateral ventricles without hydrocephalus.  Consults:  Neurology:11/21 Neurosurgery: 11/21 PCCM 11/21  Procedures:  Emergent Endotracheal intubation- Anesthesia 07/25/18   Significant Diagnostic Tests:  CT Angio Chest 07/25/18: IMPRESSION: 1. Pulmonary emboli in the upper lobes. Evaluation of lower  lobebranches is severely limited but no definitive lower lobe emboli are noted.2. Multifocal infiltrates, likely pneumonia. Recommend follow-up to resolution.3. Coronary artery calcifications. Atherosclerotic changes in the aorta. 4. Colonic diverticulosis without diverticulitis. 5. Fat stranding adjacent to the coccyx on axial image 82 isnonspecific.   Vascular Ultrasound 07/25/18: Summary: Right: Findings consistent with acute deep vein thrombosis involving the right gastrocnemius vein, right posterior tibial vein, and right popliteal vein. Left: Findings consistent with acute deep vein thrombosis involving the left peroneal vein, left posterior tibial vein, and left popliteal vein.   MRI 07/25/18:  IMPRESSION: 1. New large areas of hemorrhage in the anterior left frontal lobe and posterior left frontal and parietal lobe resulting in significant mass effect with left-to-right midline shift up to 10 mm. 2. Smaller area of hemorrhage in the anterior inferior right frontal lobe. 3. Intraventricular hemorrhage with fluid levels in the posterior horns of both lateral ventricles without hydrocephalus.  CXR post intubation 07/25/18 FINDINGS: Endotracheal tube with tip 4 cm from carina. Normal cardiac silhouette. Aorta is ectatic. Lungs are clear.  Micro Data:  Blood cx drawn 07/24/18: NGTD Urine cx drawn 07/25/18: E coli>>> Antimicrobials:  Rocephin 07/25/18 Azithromycin 07/25/18  Interim history/subjective:  No sig change remains  minimally responsive   Objective   Blood pressure 126/65, pulse 89, temperature 98.4 F (36.9 C), temperature source Axillary, resp. rate (Abnormal) 24, height 5\' 6"  (1.676 m), weight 72.6 kg, SpO2 100 %.    Vent Mode: PRVC FiO2 (%):  [30 %-100 %] 30 % Set Rate:  [18 bmp-20 bmp] 20 bmp Vt Set:  [470 mL] 470 mL PEEP:  [5 cmH20] 5 cmH20 Plateau Pressure:  [11 cmH20-15 cmH20] 11 cmH20   Intake/Output  Summary (Last 24 hours) at 07/26/2018 1233 Last data  filed at 07/26/2018 0800 Gross per 24 hour  Intake 2923.47 ml  Output 1950 ml  Net 973.47 ml   Filed Weights   07/24/18 1918  Weight: 72.6 kg    Examination: General: 72 year old African-American female currently minimally responsive HEENT normocephalic atraumatic left pupil larger than right sluggish Neuro: Withdraws to pain only pupils as mentioned above pulmonary: Clear to auscultation equal chest rise Cardiac: Regular rate and rhythm Abd: soft not tender + bowel sounds gu clear yellow  Ext brisk CR   Assessment & Plan:   Acute Intraparenchymal and Intraventricular Hemorrhage Now w/ cerebral edema and uncal herniation.   s/p anticoagulation for Acute Pulmonary embolus and lower ext thrombosis.-NSG consulted and pt is not a candidate for surgical intervention at this time. -concern for underlying amyloid  - got mannitol 11/20, now on HT saline  Plan SBP goal 120-140 HT saline protocol  (na goal 150-160) Serial neuro checks Further discussion re: goals of care as prognosis poor Keep CO2 nml  Acute Pulmonary Embolism- thrombotic involvement seen in both left and right pulmonary tree.  - Pt has no known h/o malignancy or coagulopathy prior to this admission. G Plan No AC Consider IVC   Respiratory failure in setting of ineffective airway protection Plan Full vent support VAP bundle  Wean FIO2 PAD protocol RAS goal 0 to -1 Repeating abg  Multifocal infiltrates ->Community Acquired Pneumonia: Plan Cont day 3 rocephin and azith  Ecoli UTI Plan Cont rocephin await culture data   Fluid and Electrolyte imbalance: Hypokalemia Plan Replace and recheck    Best practice:  Diet: NPO Pain/Anxiety/Delirium protocol (if indicated): propofol gtt and fentanyl gtt VAP protocol (if indicated): YES DVT prophylaxis: NONE- massive hemorrhage GI prophylaxis: pepcid Glucose control: SSI Mobility: bedrest Code Status: Full Code Position: Remains critically ill, worried  about pending further herniation, not brain dead yet, but appears to have had devastating neurological injury.  We are awaiting her son's arrival, at this point remains full code, her brothers are reluctant to make decision on his behalf  Family Communication and SW:  Lives with older sister and prior to admission was completely independent   Works at [employer] and last was at work 07/21/18         Contact numbers:  Brother: Renae Fickle [place]: [phone] Brother- Danella Deis [phone] and [phone] home Sis-n-law- Koleen Nimrod [phone] Brother- Ree Kida [phone] Phone: [phone] Merton Border (brother) Children- son Minerva Areola: no phone number available, family members are looking Disposition: ICU transfer to [facility]

## 2018-07-26 NOTE — Progress Notes (Signed)
OT Cancellation Note  Patient Details Name: Hannah Holloway MRN: [id number] DOB: 22-Jan-1946   Cancelled Treatment:    Reason Eval/Treat Not Completed: Patient not medically ready.  Pt intubated, with LE DVT and PE, not anticoagulated due to large ICH.  Will check back next week.  Jeani HawkingWendi Layton Tappan, OTR/L Acute Rehabilitation Services Pager [phone] Office [phone]   Jeani HawkingConarpe, Deneice Wack M 07/26/2018, 5:43 AM

## 2018-07-26 NOTE — Progress Notes (Signed)
Nutrition Brief Note  Chart reviewed. Pt discussed during ICU rounds and with RN.  Pt remains full code but no plans for surgery, per MD prognosis is grim. Awaiting arrival of son to make medical decision.   No nutrition interventions planned at this time.  Please consult as needed.   Kendell BaneHeather Amarien Carne RD, LDN, CNSC [phone] Pager [phone] After Hours Pager

## 2018-07-26 NOTE — Consult Note (Addendum)
Neurology Consultation  Reason for Consult: [place] Referring Physician: Dr. Tyrell Antonio  CC: ICH  History is obtained from: Chart  HPI: Hannah Holloway is a 72 y.o. female with no significant past medical history other than that of dementia, who was admitted on 07/25/2018 to [facility] long hospital due to evaluation of generalized weakness.  She lives with her sisters who left her by herself to be out of town 4 days prior to her presentation to the hospital and some friends checked on her and found her home soaked in urine presumably with very poor p.o. Intake. She was not complaining of any fevers chills chest pain nausea vomiting shortness of breath but had been too weak to go to the bathroom and was not able to eat or drink much. She had a troponin of 0.15 and increased creatinine which improved with fluids. CTA of the chest was done that revealed pulmonary emboli in the upper lobes and multifocal infiltrates likely pneumonia. She was started on heparin drip.  She continued to be lethargic during the day and her mental status declined.  MRI of the brain was done to rule out stroke as a cause of her presentation and declining mentation and revealed large areas of hemorrhage in the anterior left frontal lobe and posterior left frontal lobe along with parietal lobe resulting in significant mass-effect with left-to-right midline shift up to 10 mm.  There is a small area of hemorrhage in the anterior inferior right frontal lobe along with intraventricular hemorrhage with fluid levels in the posterior horns of both lateral ventricles without hydrocephalus initially. She was transferred over to [facility] for further evaluation after neurosurgical evaluation was performed at [facility] and she needed to be intubated for airway protection and was admitted by critical care medicine. Heparin was not reversed after discussion with me due to her pre-existing thrombolic condition-bilateral PEs and also DVTs  noted on lower extremity Dopplers. She has remained hypertensive during her stay at [facility] with blood pressures in the 160s 170s.  I recommended starting Cardene drip for systolic blood pressure goal below 140.  After the MRI revealed no bleed at [facility] the following were done: - Neurosurgery was consulted.  Do not deem her to be a surgical candidate.  I have discussed with her brother who is a physician but the plan is to continue full scope of care for now. -PCCM consulted after patient had to be intubated.  Started patient on hypertonic saline after giving 80g of mannitol -Transferred over to [facility] to the neurological ICU for neurological consultation and supportive treatment.  LKW: 4 days ago tpa given?: no, ICH Premorbid modified Rankin scale (mRS): Unable to reach family at this time.  Unable to ascertain.  ROS:  Unable to obtain due to altered mental status.   History reviewed. No pertinent past medical history.  Family History  Problem Relation Age of Onset  . Prostate cancer Father   . Prostate cancer Brother   . CAD Neg Hx    Social History:   reports that she has never smoked. She has never used smokeless tobacco. She reports that she does not drink alcohol or use drugs.  Medications  Current Facility-Administered Medications:  .  acetaminophen (TYLENOL) tablet 650 mg, 650 mg, Oral, Q6H PRN **OR** acetaminophen (TYLENOL) suppository 650 mg, 650 mg, Rectal, Q6H PRN, Doutova, Anastassia, MD .  azithromycin (ZITHROMAX) 500 mg in sodium chloride 0.9 % 250 mL IVPB, 500 mg, Intravenous, Q24H,  Vianne Bulls, MD, Stopped at 07/25/18 [phone] .  cefTRIAXone (ROCEPHIN) 1 g in sodium chloride 0.9 % 100 mL IVPB, 1 g, Intravenous, Q24H, Doutova, Anastassia, MD, Stopped at 07/25/18 0349 .  chlorhexidine gluconate (MEDLINE KIT) (PERIDEX) 0.12 % solution 15 mL, 15 mL, Mouth Rinse, BID, Ogan, Okoronkwo U, MD .  fentaNYL 2540mg in NS 2575m(1074mml)  infusion-PREMIX, 0-400 mcg/hr, Intravenous, Continuous, Ogan, Okoronkwo U, MD, Last Rate: 5 mL/hr at 07/25/18 2235, 50 mcg/hr at 07/25/18 2235 .  ipratropium-albuterol (DUONEB) 0.5-2.5 (3) MG/3ML nebulizer solution 3 mL, 3 mL, Nebulization, Q6H, Scatliffe, Kristen D, MD, 3 mL at 07/26/18 0103 .  labetalol (NORMODYNE,TRANDATE) injection 20 mg, 20 mg, Intravenous, Once, OgaFrederik PearD, Stopped at 07/25/18 2000 .  lidocaine (cardiac) 100 mg/5mL60mYLOCAINE) injection 2% 109 mg, 1.5 mg/kg, Intravenous, Once, Ogan, Okoronkwo U, MD .  MEDLINE mouth rinse, 15 mL, Mouth Rinse, 10 times per day, Ogan, Okoronkwo U, MD .  nicardipine (CARDENE) '20mg'$  in 0.86% saline 200ml21minfusion (0.1 mg/ml), 0-15 mg/hr, Intravenous, Continuous, Ogan, Okoronkwo U, MD, Last Rate: 100 mL/hr at 07/26/18 0400, 10 mg/hr at 07/26/18 0400 .  ondansetron (ZOFRAN) tablet 4 mg, 4 mg, Oral, Q6H PRN **OR** ondansetron (ZOFRAN) injection 4 mg, 4 mg, Intravenous, Q6H PRN, Doutova, Anastassia, MD .  potassium phosphate (monobasic) (K-PHOS ORIGINAL) tablet 500 mg, 500 mg, Oral, TID WC & HS, Regalado, Belkys A, MD .  propofol (DIPRIVAN) 1000 MG/100ML infusion, 5-80 mcg/kg/min, Intravenous, Titrated, Ogan, Okoronkwo U, MD, Last Rate: 8.71 mL/hr at 07/25/18 2235, 20 mcg/kg/min at 07/25/18 2235 .  sodium chloride (hypertonic) 3 % solution, , Intravenous, Continuous, Ogan, OkoroKerry Kass Last Rate: 75 mL/hr at 07/25/18 2235  Exam: Current vital signs: BP (!) 137/58   Pulse 90   Temp 99.5 F (37.5 C)   Resp (!) 21   Ht '5\' 6"'$  (1.676 m)   Wt 72.6 kg   SpO2 100%   BMI 25.82 kg/m  Vital signs in last 24 hours: Temp:  [98.2 F (36.8 C)-100.4 F (38 C)] 99.5 F (37.5 C) (11/22 0215) Pulse Rate:  [56-126] 90 (11/22 0215) Resp:  [15-35] 21 (11/22 0215) BP: (110-220)/(58-136) 137/58 (11/22 0200) SpO2:  [91 %-100 %] 100 % (11/22 0338) FiO2 (%):  [30 %-100 %] 30 % (11/22 0338) : Intubated.  Was on propofol sedation that was held for  40 minutes prior to the exam. HEENT: Normocephalic atraumatic dry mucous membranes Lungs: Clear to auscultation with no wheezing CVS: S1-S2 heard, regular rate rhythm Abdomen: Soft nondistended nontender Extremities: Warm well perfused Neurological exam Mental status: Patient is intubated, off of sedation 45 minutes.  No spontaneous movements noted. Language: She is nonverbal.  Does not follow any commands verbal. Cranial nerves: Left pupil is 4 mm barely reactive, right pupil is round 2 mm sluggishly reactive.  Bilateral corneal reflexes are present.  Gaze is disconjugate with right exotropia.  She is breathing above the ventilator.  She has cough and gag.  Facial symmetry difficult to ascertain due to the endotracheal tube. Motor exam: No spontaneous movements noted.  Upon noxious stim elation to the sternum, she localizes with the left upper extremity.  She has been minimal movement in the right upper extremity.  To noxious stimulation, she actively withdraws in the left lower extremity and somewhat weakly in the right lower extremity. Sensory exam: As above Coordination cannot be tested due to her mentation. Gait cannot be tested due to her mentation NIHSS 1a Level of Conscious.:  2 1b LOC Questions: 2 1c LOC Commands: 2 2 Best Gaze: 1 3 Visual: 0 4 Facial Palsy: 0 5a Motor Arm - left: 3 5b Motor Arm - Right: 0 6a Motor Leg - Left: 3 6b Motor Leg - Right: 3 7 Limb Ataxia: 0 8 Sensory: 1 9 Best Language: 3 10 Dysarthria: un 11 Extinct. and Inatten.: 0 TOTAL: 20  Labs I have reviewed labs in epic and the results pertinent to this consultation are:  CBC    Component Value Date/Time   WBC 11.1 (H) 07/25/2018 0528   RBC 4.71 07/25/2018 0528   HGB 14.3 07/25/2018 0528   HCT 43.6 07/25/2018 0528   PLT 188 07/25/2018 0528   MCV 92.6 07/25/2018 0528   MCH 30.4 07/25/2018 0528   MCHC 32.8 07/25/2018 0528   RDW 15.3 07/25/2018 0528   LYMPHSABS 0.6 (L) 07/24/2018 1950   MONOABS  0.7 07/24/2018 1950   EOSABS 0.0 07/24/2018 1950   BASOSABS 0.0 07/24/2018 1950   CMP     Component Value Date/Time   NA 153 (H) 07/26/2018 0200   K 3.9 07/25/2018 0528   CL 114 (H) 07/25/2018 0528   CO2 21 (L) 07/25/2018 0528   GLUCOSE 128 (H) 07/25/2018 0528   BUN 41 (H) 07/25/2018 0528   CREATININE 0.68 07/25/2018 0528   CALCIUM 9.4 07/25/2018 0528   PROT 7.8 07/25/2018 0528   ALBUMIN 3.1 (L) 07/25/2018 0528   AST 25 07/25/2018 0528   ALT 17 07/25/2018 0528   ALKPHOS 53 07/25/2018 0528   BILITOT 1.5 (H) 07/25/2018 0528   GFRNONAA >60 07/25/2018 0528   GFRAA >60 07/25/2018 0528   Imaging I have reviewed the images obtained: CT head on admission - no acute changes MRI Brain-multifocal large areas of bleed in the left anterior frontal and posterior frontal lobes along with a small area of bleed in the right frontal lobe.  Interventricular hemorrhage in bilateral posterior ventricle horns.  Assessment:  72 year old with history of dementia presenting with large left anterior and posterior frontal lobe intracranial hemorrhage and intraventricular bleeding along with small area of bleed in the right frontal lobe. She only has a history of dementia with no other medical history. She was mildly hypertensive during this admission. She was started on anticoagulation for DVT and PE. Intracranial hemorrhage likely in the setting of coagulopathy.  Cannot rule out underlying cerebral amyloid angiopathy which might predispose her to bleed in the setting of anticoagulation.  Require MRI imaging for evaluation.  Impression: ICH-hypertensive versus amyloid  ICH score- (difficult to calculate due to no CT at the time of intial diagnsosis - but to the best calculation 4-5 [which portends 97% - 100% 30-day mortality -based on ICH score paper  "Hemphill JC 3rd, [place]ovich DC, Besmertis L, Domingo Dimes, [employer] [place]. The ICH score: a simple, reliable grading scale for intracerebral hemorrhage. Stroke.  2001 Apr;32(4):891-7."]  Recommendations: -Systolic blood pressure less than 140 - Most recent sodium 153.  Hold 3% for the fear of overshooting the goal. -Repeat sodium in 6 hours.  Decision for hypertonic therapy per stroke team in the morning. -No antiplatelets or anticoagulants -Would not recommend reversal of heparin because of already thrombolic state with DVTs and PEs. -Neurological prognosis at this time appears grim.  I have not been able to get in touch with family yet but neurosurgical consultation by Dr. Vertell Limber reports speaking with the family and pursuing full scope of care for now. Patient deemed to be a poor candidate  for surgery which I agree with. -Supportive care per PCCM.  -Repeat [facility]  Neurology will continue to follow with you. -- Amie Portland, MD [facility] Pager: [phone] If 7pm to 7am, please call on call as listed on AMION.  CRITICAL CARE ATTESTATION Performed by: Amie Portland, MD Total critical care time: 55 minutes Critical care time was exclusive of separately billable procedures and treating other patients and/or supervising APPs/Residents/Students Critical care was necessary to treat or prevent imminent or life-threatening deterioration due to INTRACEREBRAL HEMORRHAGE, CEREBRAL EDEMA AND MIDLINE SHIFT, BRAIN HERNIATION This patient is critically ill and at significant risk for neurological worsening and/or death and care requires constant monitoring. Critical care was time spent personally by me on the following activities: development of treatment plan with patient and/or surrogate as well as nursing, discussions with consultants, evaluation of patient's response to treatment, examination of patient, obtaining history from patient or surrogate, ordering and performing treatments and interventions, ordering and review of laboratory studies, ordering and review of radiographic studies, pulse oximetry, re-evaluation of patient's condition,  participation in multidisciplinary rounds and medical decision making of high complexity in the care of this patient.

## 2018-07-26 NOTE — Progress Notes (Signed)
eLink Physician-Brief Progress Note Patient Name: Jene EveryLucille Piech DOB: 1945-09-28 MRN: [id number]   Date of Service  07/26/2018  HPI/Events of Note  Massive ICH with coma inpatient admitted with bilateral LE DVT, PE, and then developed left frontal and parietal, right frontal, intraventricular hemorrhages with 10 mm shift, and coma. Pt also with acute resp failure due to inability to protect airway.  eICU Interventions  80 gm Mannitol, 3 % saline infusion, 100 mg lidocaine iv x 1, emergent intubation, Cardene infusion to keep SBP <  140, Protamine 50 mg iv x 1 to reverse Heparin, stat neurology and neurosurgical consults. Neurosurgery indicated pt not a candidate for IVD or craniotomy. I tried to contact son who is legal NOK but he did not answer phone and I could not leave a message. Spoke with brother and provided Dr. Venetia MaxonStern with his number. Per discussion I had with Dr. Venetia MaxonStern he spoke with pt's brother at length and communicated grim prognosis. He stated that he told pt brother that pt is not a candidate for neurosurgical intervention. [facility].        Thomasene Lotkoronkwo U Akeyla Molden 07/26/2018, 2:41 AM

## 2018-07-27 ENCOUNTER — Inpatient Hospital Stay (HOSPITAL_COMMUNITY): Payer: Medicare Other

## 2018-07-27 ENCOUNTER — Inpatient Hospital Stay: Payer: Self-pay

## 2018-07-27 DIAGNOSIS — G936 Cerebral edema: Secondary | ICD-10-CM

## 2018-07-27 DIAGNOSIS — J9601 Acute respiratory failure with hypoxia: Secondary | ICD-10-CM

## 2018-07-27 DIAGNOSIS — G935 Compression of brain: Secondary | ICD-10-CM

## 2018-07-27 DIAGNOSIS — G9341 Metabolic encephalopathy: Secondary | ICD-10-CM

## 2018-07-27 LAB — CBC
HCT: 39.2 % (ref 36.0–46.0)
Hemoglobin: 12.1 g/dL (ref 12.0–15.0)
MCH: 29.4 pg (ref 26.0–34.0)
MCHC: 30.9 g/dL (ref 30.0–36.0)
MCV: 95.1 fL (ref 80.0–100.0)
NRBC: 0 % (ref 0.0–0.2)
PLATELETS: 159 10*3/uL (ref 150–400)
RBC: 4.12 MIL/uL (ref 3.87–5.11)
RDW: 15.5 % (ref 11.5–15.5)
WBC: 11 10*3/uL — AB (ref 4.0–10.5)

## 2018-07-27 LAB — URINE CULTURE: Culture: >=100000 — AB

## 2018-07-27 LAB — SODIUM
SODIUM: 159 mmol/L — AB (ref 135–145)
SODIUM: 160 mmol/L — AB (ref 135–145)
SODIUM: 162 mmol/L — AB (ref 135–145)

## 2018-07-27 LAB — BASIC METABOLIC PANEL
ANION GAP: 9 (ref 5–15)
BUN: 12 mg/dL (ref 8–23)
CALCIUM: 8.9 mg/dL (ref 8.9–10.3)
CO2: 22 mmol/L (ref 22–32)
CREATININE: 0.5 mg/dL (ref 0.44–1.00)
Chloride: 126 mmol/L — ABNORMAL HIGH (ref 98–111)
Glucose, Bld: 132 mg/dL — ABNORMAL HIGH (ref 70–99)
Potassium: 3.1 mmol/L — ABNORMAL LOW (ref 3.5–5.1)
Sodium: 157 mmol/L — ABNORMAL HIGH (ref 135–145)

## 2018-07-27 MED ORDER — METOPROLOL TARTRATE 25 MG PO TABS: 25.0000 mg | ORAL_TABLET | Freq: Two times a day (BID) | ORAL | Status: DC

## 2018-07-27 MED ORDER — SODIUM CHLORIDE 0.9% FLUSH: 10.0000 mL | INTRAVENOUS | Status: DC | PRN

## 2018-07-27 MED ORDER — POTASSIUM CHLORIDE 20 MEQ/15ML (10%) PO SOLN
40.0000 meq | ORAL | Status: AC
  Administered 2018-07-27 (×2): 40 meq via ORAL
  Filled 2018-07-27 (×2): qty 30

## 2018-07-27 MED ORDER — AMLODIPINE BESYLATE 10 MG PO TABS
10.0000 mg | ORAL_TABLET | Freq: Every day | ORAL | Status: DC
  Administered 2018-07-27 – 2018-07-28 (×2): 10 mg via ORAL
  Filled 2018-07-27 (×2): qty 1

## 2018-07-27 MED ORDER — METOPROLOL TARTRATE 25 MG PO TABS
25.0000 mg | ORAL_TABLET | Freq: Two times a day (BID) | ORAL | Status: DC
  Administered 2018-07-27 (×2): 25 mg via ORAL
  Filled 2018-07-27 (×2): qty 1

## 2018-07-27 MED ORDER — METOPROLOL TARTRATE 5 MG/5ML IV SOLN
5.0000 mg | Freq: Once | INTRAVENOUS | Status: AC
  Administered 2018-07-27: 5 mg via INTRAVENOUS
  Filled 2018-07-27: qty 5

## 2018-07-27 MED ORDER — SODIUM CHLORIDE 0.45 % IV SOLN
INTRAVENOUS | Status: DC
  Administered 2018-07-27: 18:00:00 via INTRAVENOUS

## 2018-07-27 MED ORDER — CHLORHEXIDINE GLUCONATE CLOTH 2 % EX PADS
6.0000 | MEDICATED_PAD | Freq: Every day | CUTANEOUS | Status: DC
  Administered 2018-07-27 – 2018-08-04 (×7): 6 via TOPICAL

## 2018-07-27 MED ORDER — SODIUM CHLORIDE 0.9% FLUSH
10.0000 mL | Freq: Two times a day (BID) | INTRAVENOUS | Status: DC
  Administered 2018-07-27: 10 mL
  Administered 2018-07-27: 20 mL
  Administered 2018-07-28 – 2018-07-29 (×4): 10 mL
  Administered 2018-07-30: 20 mL
  Administered 2018-07-31 – 2018-08-04 (×9): 10 mL
  Administered 2018-08-05: 3 mL
  Administered 2018-08-06 – 2018-08-09 (×7): 10 mL

## 2018-07-27 NOTE — Progress Notes (Addendum)
STROKE TEAM PROGRESS NOTE   SUBJECTIVE (INTERVAL HISTORY) Her brother Merton Border and his son Greig Castilla are at the bedside.  Patient still intubated, not on sedation, on cleviprex for BP control. HR up and received metoprolol IV. Discussed with brother Merton Border at the bedside and also pt son Minerva Areola over the phone, will insert OG tube today and place PICC line. Son Minerva Areola is coming this afternoon around 5pm.    OBJECTIVE Vitals:   07/27/18 0645 07/27/18 0700 07/27/18 0715 07/27/18 0755  BP: 136/72 135/70 132/70   Pulse: (!) 102 100 (!) 101   Resp: 19 18 18 18   Temp:      TempSrc:      SpO2: 100% 100% 100%   Weight:      Height:        CBC:  Recent Labs  Lab 07/24/18 1950 07/25/18 0528 07/27/18 0548  WBC 10.1 11.1* 11.0*  NEUTROABS 8.7*  --   --   HGB 17.4* 14.3 12.1  HCT 54.5* 43.6 39.2  MCV 92.7 92.6 95.1  PLT 239 188 159    Basic Metabolic Panel:  Recent Labs  Lab 07/25/18 0528  07/26/18 0846  07/26/18 2331 07/27/18 0548  NA 147*   < > 153*   < > 160* 157*  K 3.9  --  3.2*  --   --  3.1*  CL 114*  --  125*  --   --  126*  CO2 21*  --  21*  --   --  22  GLUCOSE 128*  --  144*  --   --  132*  BUN 41*  --  16  --   --  12  CREATININE 0.68  --  0.61  --   --  0.50  CALCIUM 9.4  --  8.9  --   --  8.9  MG 2.6*  --   --   --   --   --   PHOS 1.9*  --   --   --   --   --    < > = values in this interval not displayed.    Lipid Panel:     Component Value Date/Time   TRIG 16 07/26/2018 1331   HgbA1c:  Lab Results  Component Value Date   HGBA1C 5.7 (H) 07/25/2018   Urine Drug Screen: No results found for: LABOPIA, COCAINSCRNUR, LABBENZ, AMPHETMU, THCU, LABBARB  Alcohol Level No results found for: ETH  IMAGING   Ct Head Wo Contrast  Result Date: 07/26/2018 CLINICAL DATA:  72 y/o  F; acute hemorrhage for follow-up. EXAM: CT HEAD WITHOUT CONTRAST TECHNIQUE: Contiguous axial images were obtained from the base of the skull through the vertex without intravenous contrast.  COMPARISON:  07/25/2018 CT head.  07/25/2018 MRI head. FINDINGS: Brain: Stable left frontal hemorrhage measuring 4.5 x 3.9 x 3.9 cm (AP x ML x CC series 3, image 14 and series 5, image 16). Stable right anterior frontal hematoma measuring 21 mm (series 3, image 15). Stable left frontoparietal hemorrhage measuring 3.4 x 5.0 x 3.4 cm (AP x ML x CC series 5, image 45 and series 3, image 17). Stable 11 mm hemorrhage within the left lateral frontal lobe (series 3, image 17). Stable intraventricular hemorrhage pooling in the occipital horns of the lateral ventricles. Stable subarachnoid hemorrhage predominantly in the left greater than right sylvian fissures and along the right anterior frontal lobe. Stable thin subdural hematoma along the left tentorium cerebelli. Stable 10 mm left-to-right midline  shift of the anterior septum pellucidum. No new stroke, hemorrhage, focal mass effect, or herniation. Vascular: Calcific atherosclerosis of the carotid siphons. No hyperdense vessel identified. Skull: Normal. Negative for fracture or focal lesion. Sinuses/Orbits: No acute finding. Other: None. IMPRESSION: In comparison with the prior MRI of the head given differences in technique: 1. Stable acute hemorrhages within the left frontal lobe, right frontal lobe, left frontal parietal junction, and left lateral frontal lobe. 2. Stable intraventricular hemorrhage, thin subdural hematoma along the left tentorium, and small volume subarachnoid hemorrhage. 3. Stable 10 mm left-to-right midline shift of anterior septum pellucidum. 4. No new acute intracranial process identified. Electronically Signed   By: Mitzi HansenLance  Furusawa-Stratton M.D.   On: 07/26/2018 04:54   Mr Brain Wo Contrast  Result Date: 07/25/2018 CLINICAL DATA:  Acute mental status changes. Pulmonary embolus and DVT. EXAM: MRI HEAD WITHOUT CONTRAST TECHNIQUE: Multiplanar, multiecho pulse sequences of the brain and surrounding structures were obtained without intravenous  contrast. COMPARISON:  CT head without contrast 07/25/2018 12:25 a.m. FINDINGS: Brain: Multifocal areas of acute hemorrhage are present. Anterior left frontal lobe hemorrhage measures 4.0 x 4.5 x 4.3 Cm. A new hemorrhage in the posterior left frontal and left parietal lobe measures 5.5 x 3.0 x 3.5 cm. A smaller area of hemorrhage in the anterior inferior right frontal lobe measures 2.4 cm. Left right midline shift is 10 mm. There is effacement of the left lateral ventricle. Blood products are seen within the ventricles bilaterally. No hydrocephalus is evident. Posterior fossa is unremarkable. No acute parenchymal infarct is present otherwise. Vascular: Flow is present in the major intracranial arteries. Skull and upper cervical spine: Craniocervical junction is normal. Upper cervical spine is normal. Sinuses/Orbits: The left sphenoid sinus is partially opacified. No other significant paranasal sinus disease is present. Mastoid air cells are clear. Globes and orbits are within normal limits. IMPRESSION: 1. New large areas of hemorrhage in the anterior left frontal lobe and posterior left frontal and parietal lobe resulting in significant mass effect with left-to-right midline shift up to 10 mm. 2. Smaller area of hemorrhage in the anterior inferior right frontal lobe. 3. Intraventricular hemorrhage with fluid levels in the posterior horns of both lateral ventricles without hydrocephalus. Critical Value/emergent results were called by telephone at the time of interpretation on 07/25/2018 at 6:39 pm to Dr. [name] , who verbally acknowledged these results. Electronically Signed   By: Marin Robertshristopher  Mattern M.D.   On: 07/25/2018 19:01   Dg Chest Port 1 View  Result Date: 07/25/2018 CLINICAL DATA:  Status post intubation EXAM: PORTABLE CHEST 1 VIEW COMPARISON:  CT 07/25/2018 FINDINGS: Endotracheal tube with tip 4 cm from carina. Normal cardiac silhouette. Aorta is ectatic. Lungs are clear. IMPRESSION:  Endotracheal tube in good position. Electronically Signed   By: Genevive BiStewart  Edmunds M.D.   On: 07/25/2018 20:49   Vas [place] Lower Extremity Venous (dvt)  Result Date: 07/25/2018  Lower Venous Study Indications: SOB, and pulmonary embolism.  Risk Factors: Confirmed PE. Performing Technologist: Toma DeitersVirginia Slaughter RVS  Examination Guidelines: A complete evaluation includes B-mode imaging, spectral Doppler, color Doppler, and power Doppler as needed of all accessible portions of each vessel. Bilateral testing is considered an integral part of a complete examination. Limited examinations for reoccurring indications may be performed as noted.  Right Venous Findings: +---------+---------------+---------+-----------+----------+------------------+          CompressibilityPhasicitySpontaneityPropertiesSummary            +---------+---------------+---------+-----------+----------+------------------+ CFV      Full  Yes      Yes                                     +---------+---------------+---------+-----------+----------+------------------+ SFJ      Full                                                            +---------+---------------+---------+-----------+----------+------------------+ FV Prox  Full           Yes      Yes                                     +---------+---------------+---------+-----------+----------+------------------+ FV Mid   Full                                                            +---------+---------------+---------+-----------+----------+------------------+ FV DistalFull           Yes      Yes                                     +---------+---------------+---------+-----------+----------+------------------+ PFV      Full           Yes      Yes                                     +---------+---------------+---------+-----------+----------+------------------+ POP      Partial        Yes      Yes                                      +---------+---------------+---------+-----------+----------+------------------+ PTV      Partial                                      Acute mid to                                                             proximal           +---------+---------------+---------+-----------+----------+------------------+ PERO                                                  Difficult to  visualize          +---------+---------------+---------+-----------+----------+------------------+ Gastroc  Partial                                      Acute              +---------+---------------+---------+-----------+----------+------------------+  Right Technical Findings: Difficult to position the leg for distal imaging due to dementia and patient poor to repond.  Left Venous Findings: +---------+---------------+---------+-----------+----------+-------------+          CompressibilityPhasicitySpontaneityPropertiesSummary       +---------+---------------+---------+-----------+----------+-------------+ CFV      Full           Yes      Yes                                +---------+---------------+---------+-----------+----------+-------------+ SFJ      Full                                                       +---------+---------------+---------+-----------+----------+-------------+ FV Prox  Full           Yes      Yes                                +---------+---------------+---------+-----------+----------+-------------+ FV Mid   Full                                                       +---------+---------------+---------+-----------+----------+-------------+ FV DistalFull           Yes      Yes                                +---------+---------------+---------+-----------+----------+-------------+ PFV      Full           Yes      Yes                                 +---------+---------------+---------+-----------+----------+-------------+ POP      Partial                                      Mid to distal +---------+---------------+---------+-----------+----------+-------------+ PTV      None                                                       +---------+---------------+---------+-----------+----------+-------------+ PERO     None                                                       +---------+---------------+---------+-----------+----------+-------------+  Left Technical Findings: Difficult to positin the lef for distal imaging due to dementia and patient poorto respond. Rouleux flow was noted in the proxiaml popliteal vein.   Summary: Right: Findings consistent with acute deep vein thrombosis involving the right gastrocnemius vein, right posterior tibial vein, and right popliteal vein. See technical findings listed above Left: Findings consistent with acute deep vein thrombosis involving the left peroneal vein, left posterior tibial vein, and left popliteal vein. See technical findings listed above  *See table(s) above for measurements and observations. Electronically signed by Sherald Hess MD on 07/25/2018 at 3:36:49 PM.    Final     Transthoracic Echocardiogram  Normal LV size with EF 65-70%. Normal RV size and systolic   function. Flattened mitral valve closure plane without frank   prolapse, mild to moderate mitral regurgitation. Mild pulmonary   hypertension.  CT head pending   PHYSICAL EXAM  Temp:  [98.4 F (36.9 C)-100 F (37.8 C)] 99 F (37.2 C) (11/23 0400) Pulse Rate:  [84-122] 101 (11/23 0715) Resp:  [17-30] 18 (11/23 0755) BP: (116-145)/(49-74) 132/70 (11/23 0715) SpO2:  [97 %-100 %] 100 % (11/23 0715) Arterial Line BP: (105-153)/(47-67) 118/56 (11/23 0755) FiO2 (%):  [30 %] 30 % (11/23 0423)  General - Well nourished, well developed, intubated.  Ophthalmologic - fundi not visualized due to  noncooperation.  Cardiovascular - Regular rhythm, but tachycardia.  Neuro - intubated not on sedation, eyes closed, not open on voice but slightly open on pain stimulation.  Not following any commands.  Left pupil 3 mm, brisk to light, right pupil 2 mm, brisk to light.  Left gaze preference, no doll's eyes, left corneal weak but present, right corneal absent.  Gag and cough reflex present.  On pain summation LUE withdraw and localizing to pain, RUE trace withdraw.  LLE withdraws to pain 2-/5 muscle strength, but RLE trace withdraw.  DTR 1+, bilateral Babinski positive. Sensation, coordination and gait not tested.   ASSESSMENT/PLAN Ms. Hannah Holloway is a 72 y.o. female with history of dementia presenting with generalized weakness, found down at home, soaked in urine.  She was admitted to [facility] and found to have bilateral PE on chest CT and bilateral DVT and lower extremities.  She was put on heparin IV but then developed worsening mental status for which MRI was done showed left frontal anterior and posterior ICH with small right frontal ICH and IVH.  Patient transferred to [facility] for further evaluation.   ICH: Left anterior and posterior frontal large hematoma, right anterior frontal small hematoma, and IVH bilaterally.  Heparin was reversed with protamine.  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  Resultant unresponsive, intubated  CT head 07/25/2018 no acute abnormality  MRI head showed left anterior and posterior frontal large hematoma, right anterior frontal small hematoma and IVH. However, SWI sequence was not able to obtain due to noncooperation.  CT head repeat 07/26/18 confirmed ICH and IVH as above  CT repeat pending in am  Neurosurgery consulted, Dr. Venetia Maxon recommended against surgical intervention.  2D Echo - EF 65 to 70%  HgbA1c 5.7  VTE prophylaxis - SCDs  No  antithrombotic prior to admission, was treated with heparin IV for PE/DVT, now on No antithrombotic given ICH.  Ongoing aggressive stroke risk factor management  Therapy recommendations:  pending  Disposition:  Pending  Poor prognosis and need to discuss about treatment plan and goals  of care. Son will be here this afternoon.   Cerebral edema and uncal herniation  CT and MRI showed midline shift 10 mm  anisocoria with L>R, but papillary reflex present bilaterally  CT repeat pending in am  Mannitol 1 dose given on admission  Currently on 3% saline - continue  PICC line placement today  Sodium 144->153->160->157  Sodium goal 150 - 160  Sodium check every 6 hours  Patient brothers Renae Fickle and Merton Border are neurosurgeon and orthopedic surgeon and so far would like to continue aggressive medical management  Discussed with Merton Border at the bedside and pt son Minerva Areola over the phone, will place OG tube and PICC line today  Bilateral PE and DVT  Patient found down at home, soaked in urine  Chest CT showed bilateral upper lobe PE  LE venous Doppler showed bilateral DVT  Was on heparin IV which has been discontinued due to ICH  Respiratory distress  Intubated for not protecting airway  On ventilation  CCM on board  Sepsis  CT chest showed multifocal pneumonia  UA WBC 21-50  Urine culture showed gram-negative rods  Leukocytosis with WBC 11.1->11.0  Currently on azithromycin, Rocephin  Hypertension  Stable on cleviprex . BP goal less than 140 . OG tube will be place and will start po meds . Long-term BP goal normotensive  Dysphagia   Did not pass swallow  Will put OG tube  Will consider po BP meds and TF once po access  Other Stroke Risk Factors  Advanced age  Other Active Problems  Baseline dementia    Hospital day # 2  This patient is critically ill due to extensive ICH, IVH, bilateral PE/DVT, cerebral edema and uncal herniation, sepsis with  pneumonia and UTI and at significant risk of neurological worsening, death form uncal herniation, cerebral edema, heart failure, respiratory failure, seizure and sepsis as well as brain death. This patient's care requires constant monitoring of vital signs, hemodynamics, respiratory and cardiac monitoring, review of multiple databases, neurological assessment, discussion with family, other specialists and medical decision making of high complexity. I spent 40 minutes of neurocritical care time in the care of this patient. I had long discussion with bother Merton Border at bedside and her son Minerva Areola over the phone, updated pt current condition, treatment plan and potential prognosis. They expressed understanding and appreciation.   Marvel Plan, MD PhD Stroke Neurology 07/27/2018 10:15 AM   To contact Stroke Continuity provider, please refer to [email or web address]. After hours, contact General Neurology

## 2018-07-27 NOTE — Progress Notes (Signed)
CRITICAL VALUE ALERT  Critical Value:  Sodium 160  Date & Time Notied:  07/27/18 0030  Provider Notified: Wilford CornerArora  Orders Received/Actions taken: 3% stopped, recheck Na in 6 hours

## 2018-07-27 NOTE — Progress Notes (Signed)
Verbal order from neurology to keep foley.  RN will continue to monitor.

## 2018-07-27 NOTE — Progress Notes (Signed)
CRITICAL VALUE ALERT  Critical Value:  162  Date & Time Notied:  07/27/18, 1800  Provider Notified: Neurology  Orders Received/Actions taken: 3 % stop

## 2018-07-27 NOTE — Plan of Care (Signed)
Received critical lab value of Na 162. Will hold off 3% saline 40cc/h at this time. Will initiate 1/2NS @ 40cc/h for maintenance fluid. Will consider tube feeding in am after discuss with son.  If next Na check is > 155, continue to hold 3% saline and continue 1/2NS infusion. If Na < 155, will re-start 3% saline at 40cc and discontinue 1/2NS infusion. Discussed with RN.   Marvel PlanJindong Climmie Cronce, MD PhD Stroke Neurology 07/27/2018 6:07 PM

## 2018-07-27 NOTE — Progress Notes (Signed)
No significant change in status.  Patient remains intubated on ventilator and minimally responsive.  Multifocal intracerebral hemorrhages.  No indications for surgical intervention.  No new recommendations.

## 2018-07-27 NOTE — Progress Notes (Signed)
..   NAME:  Hannah Holloway, MRN:  [id number], DOB:  [birth year], LOS: 2 ADMISSION DATE:  07/24/2018, CONSULTATION DATE:  07/25/18 REFERRING MD:  Sunnie Nielsen, CHIEF COMPLAINT:  Altered mental status   Brief History   72 yr old female who was admitted on 07/24/18 for mgmt of Acute PE secondary to lower ext thrombosis from immobility started on Heparin gtt for anticoagulation. Experienced acute change in mental status while in MRI where pt was found to have a large left sided IPH and IVH with midline shift 10 mm.   Past Medical History  No significant PMHx .Marland Kitchen  Significant Hospital Events   11/20: 1.  Acute PE: Pulmonary emboli are seen in left upper lobe pulmonary arteries While evaluation of right-sided pulmonary arteries is limited due to streak artifact off of the SVC there still appears to be emboli in the right upper lobe pulmonary arterial branchesa. Significant artifact limits evaluation of the lower lobe branches. Number/21: Bilateral lower extremity DVTs: Vasc US 11/21-> Findings consistent with acute deep vein thrombosis involving the right gastrocnemius vein, right posterior tibial vein, and right popliteal vein. And acute deep vein thrombosis involving the left peroneal vein, left posterior tibial vein, and left popliteal vein. 11/21: New left sided hemorrhage in the anterior left frontal lobe and posterior left frontal and parietal lobe resulting in significant mass effect with left-to-right midline shift up to 10 mm.  Smaller area of hemorrhage in the anterior inferior right frontal lobe.Along with  Intraventricular hemorrhage with fluid levels in the posterior horns of both lateral ventricles without hydrocephalus.  Consults:  Neurology:11/21 Neurosurgery: 11/21 PCCM 11/21  Procedures:  Emergent Endotracheal intubation- Anesthesia 07/25/18   Significant Diagnostic Tests:  CT Angio Chest 07/25/18: IMPRESSION: 1. Pulmonary emboli in the upper lobes. Evaluation of lower  lobebranches is severely limited but no definitive lower lobe emboli are noted.2. Multifocal infiltrates, likely pneumonia. Recommend follow-up to resolution.3. Coronary artery calcifications. Atherosclerotic changes in the aorta. 4. Colonic diverticulosis without diverticulitis. 5. Fat stranding adjacent to the coccyx on axial image 82 isnonspecific.   Vascular Ultrasound 07/25/18: Summary: Right: Findings consistent with acute deep vein thrombosis involving the right gastrocnemius vein, right posterior tibial vein, and right popliteal vein. Left: Findings consistent with acute deep vein thrombosis involving the left peroneal vein, left posterior tibial vein, and left popliteal vein.   MRI 07/25/18:  IMPRESSION: 1. New large areas of hemorrhage in the anterior left frontal lobe and posterior left frontal and parietal lobe resulting in significant mass effect with left-to-right midline shift up to 10 mm. 2. Smaller area of hemorrhage in the anterior inferior right frontal lobe. 3. Intraventricular hemorrhage with fluid levels in the posterior horns of both lateral ventricles without hydrocephalus.  CXR post intubation 07/25/18 FINDINGS: Endotracheal tube with tip 4 cm from carina. Normal cardiac silhouette. Aorta is ectatic. Lungs are clear.  Micro Data:  Blood cx drawn 07/24/18: NGTD Urine cx drawn 07/25/18: E coli>>> Antimicrobials:  Rocephin 07/25/18 Azithromycin 07/25/18  Interim history/subjective:  No sig change remains  minimally responsive   Objective   Blood pressure 130/64, pulse 97, temperature 98.1 F (36.7 C), temperature source Axillary, resp. rate (!) 22, height 5\' 6"  (1.676 m), weight 72.6 kg, SpO2 100 %.    Vent Mode: PRVC FiO2 (%):  [30 %] 30 % Set Rate:  [12 bmp-20 bmp] 12 bmp Vt Set:  [470 mL] 470 mL PEEP:  [5 cmH20] 5 cmH20 Plateau Pressure:  [11 cmH20-14 cmH20] 13 cmH20   Intake/Output Summary (  Last 24 hours) at 07/27/2018 1055 Last data filed at  07/27/2018 0800 Gross per 24 hour  Intake 2372.07 ml  Output 2140 ml  Net 232.07 ml   Filed Weights   07/24/18 1918  Weight: 72.6 kg    Examination: General: 72 year old African-American female currently minimally responsive HEENT normocephalic atraumatic left pupil larger than right sluggish Neuro: Withdraws to pain only pupils as mentioned above pulmonary: Clear to auscultation equal chest rise Cardiac: Regular rate and rhythm Abd: soft not tender + bowel sounds gu clear yellow  Ext brisk CR   Assessment & Plan:   Acute Intraparenchymal and Intraventricular Hemorrhage Now w/ cerebral edema and uncal herniation.   s/p anticoagulation for Acute Pulmonary embolus and lower ext thrombosis.-NSG consulted and pt is not a candidate for surgical intervention at this time. -concern for underlying amyloid  - got mannitol 11/20, now on HT saline  Plan Unfortunately poor prognosis based on neurology and neurosurgery evaluation Repeat head CT ordered for 11/24 by neurology Hypertonic saline to continue (goal sodium 150-155).  PICC line to be placed 11/23 Tight blood pressure control, currently on Cleviprex Follow for any evidence neurological improvement with supportive care.  If no changes noted then we will have to discuss with family goals for care, prognosis, possible transition to comfort  Acute Pulmonary Embolism- thrombotic involvement seen in both left and right pulmonary tree.  - Pt has no known h/o malignancy or coagulopathy prior to this admission. G Plan Anticoagulation is on hold Could consider IVC filter but I would defer pending further definition of her neurological prognosis   Respiratory failure in setting of ineffective airway protection Plan Continue PRVC as ordered.  She may be able to tolerate some pressure support going forward but she does not have the mental status to tolerate an extubation VAP prevention orders Hold all sedating medication  Multifocal  infiltrates ->Community Acquired Pneumonia: Plan Continue ceftriaxone, azithromycin.  Day 4  Ecoli UTI, pansensitive, ESBL negative Plan On ceftriaxone  Fluid and Electrolyte imbalance: Hypokalemia Plan Replace electro lites as indicated Follow BMP    Best practice:  Diet: Planning to start tube feeding Pain/Anxiety/Delirium protocol (if indicated): propofol gtt and fentanyl gtt VAP protocol (if indicated): YES DVT prophylaxis: NONE- massive hemorrhage GI prophylaxis: pepcid Glucose control: SSI Mobility: bedrest Code Status: Full Code Position: Remains critically ill, worried about pending further herniation, not brain dead yet, but appears to have had devastating neurological injury.  We are awaiting her son's arrival, at this point remains full code, her brothers are reluctant to make decision on his behalf  Family Communication and SW:  Lives with older sister and prior to admission was completely independent   Works at [employer] and last was at work 07/21/18         Contact numbers:  Brother: Renae Fickleaul [place]: [phone] Brother- Danella Deisarroll Carter [phone] and [phone] Sis-n-law- Koleen NimrodFredia [phone] Brother- Ree KidaJack [phone] Phone: [phone] Merton Borderrthur (brother) Children- son Minerva Areolaric: no phone number available, family members are looking [facility]  Brother updated at bedside by Dr. Delton CoombesByrum on 11/23  Independent critical care time 31 minutes  Levy Pupaobert Xandrea Clarey, MD, PhD 07/27/2018, 11:07 AM [facility] [phone] or if no answer [phone]

## 2018-07-27 NOTE — Plan of Care (Signed)
  Problem: Clinical Measurements: Goal: Ability to maintain clinical measurements within normal limits will improve Outcome: Not Progressing   Problem: Nutrition: Goal: Adequate nutrition will be maintained Outcome: Not Progressing   

## 2018-07-27 NOTE — Progress Notes (Signed)
Neurology paged regarding sodium.  Verbal order to stop 3% and recheck sodium.  Restart 3% if sodium is less than 155.  New orders received.  RN will continue to monitor.

## 2018-07-27 NOTE — Progress Notes (Signed)
Peripherally Inserted Central Catheter/Midline Placement  The IV Nurse has discussed with the patient and/or persons authorized to consent for the patient, the purpose of this procedure and the potential benefits and risks involved with this procedure.  The benefits include less needle sticks, lab draws from the catheter, and the patient may be discharged home with the catheter. Risks include, but not limited to, infection, bleeding, blood clot (thrombus formation), and puncture of an artery; nerve damage and irregular heartbeat and possibility to perform a PICC exchange if needed/ordered by physician.  Alternatives to this procedure were also discussed.  Bard Power PICC patient education guide, fact sheet on infection prevention and patient information card has been provided to patient /or left at bedside.    PICC/Midline Placement Documentation  PICC Double Lumen 07/27/18 PICC Right Brachial 37 cm 0 cm (Active)  Indication for Insertion or Continuance of Line Administration of hyperosmolar/irritating solutions (i.e. TPN, Vancomycin, etc.) 07/27/2018 12:00 PM  Exposed Catheter (cm) 0 cm 07/27/2018 12:00 PM  Site Assessment Clean;Dry;Intact 07/27/2018 12:00 PM  Lumen #1 Status Flushed;Blood return noted;Saline locked 07/27/2018 12:00 PM  Lumen #2 Status Flushed;Blood return noted;Saline locked 07/27/2018 12:00 PM  Dressing Type Transparent 07/27/2018 12:00 PM  Dressing Status Clean;Dry;Intact;Antimicrobial disc in place 07/27/2018 12:00 PM  Line Care Connections checked and tightened 07/27/2018 12:00 PM  Line Adjustment (NICU/IV Team Only) No 07/27/2018 12:00 PM  Dressing Change Due 08/03/18 07/27/2018 12:00 PM       Edwin Caphomasson III, Rhyder Koegel Andrew 07/27/2018, 12:55 PM

## 2018-07-28 ENCOUNTER — Inpatient Hospital Stay (HOSPITAL_COMMUNITY): Payer: Medicare Other

## 2018-07-28 LAB — PROTIME-INR
INR: 1.34
Prothrombin Time: 16.5 seconds — ABNORMAL HIGH (ref 11.4–15.2)

## 2018-07-28 LAB — CBC
HEMATOCRIT: 37.8 % (ref 36.0–46.0)
Hemoglobin: 11.6 g/dL — ABNORMAL LOW (ref 12.0–15.0)
MCH: 29.7 pg (ref 26.0–34.0)
MCHC: 30.7 g/dL (ref 30.0–36.0)
MCV: 96.7 fL (ref 80.0–100.0)
PLATELETS: 142 10*3/uL — AB (ref 150–400)
RBC: 3.91 MIL/uL (ref 3.87–5.11)
RDW: 15.8 % — AB (ref 11.5–15.5)
WBC: 8.5 10*3/uL (ref 4.0–10.5)
nRBC: 0 % (ref 0.0–0.2)

## 2018-07-28 LAB — MAGNESIUM
Magnesium: 2.4 mg/dL (ref 1.7–2.4)
Magnesium: 2.4 mg/dL (ref 1.7–2.4)
Magnesium: 2.3 mg/dL (ref 1.7–2.4)

## 2018-07-28 LAB — BASIC METABOLIC PANEL
Anion gap: 5 (ref 5–15)
BUN: 16 mg/dL (ref 8–23)
CALCIUM: 9.1 mg/dL (ref 8.9–10.3)
CO2: 25 mmol/L (ref 22–32)
Chloride: 128 mmol/L — ABNORMAL HIGH (ref 98–111)
Creatinine, Ser: 0.73 mg/dL (ref 0.44–1.00)
GFR calc Af Amer: >60 mL/min (ref >60)
GLUCOSE: 147 mg/dL — AB (ref 70–99)
Potassium: 3.8 mmol/L (ref 3.5–5.1)
Sodium: 158 mmol/L — ABNORMAL HIGH (ref 135–145)

## 2018-07-28 LAB — SODIUM
Sodium: 156 mmol/L — ABNORMAL HIGH (ref 135–145)
Sodium: 156 mmol/L — ABNORMAL HIGH (ref 135–145)
Sodium: 158 mmol/L — ABNORMAL HIGH (ref 135–145)

## 2018-07-28 LAB — PHOSPHORUS
PHOSPHORUS: 2.4 mg/dL — AB (ref 2.5–4.6)
Phosphorus: 2.1 mg/dL — ABNORMAL LOW (ref 2.5–4.6)

## 2018-07-28 LAB — GLUCOSE, CAPILLARY: Glucose-Capillary: 156 mg/dL — ABNORMAL HIGH (ref 70–99)

## 2018-07-28 MED ORDER — ACETAMINOPHEN 160 MG/5ML PO SOLN
650.0000 mg | Freq: Four times a day (QID) | ORAL | Status: DC | PRN
  Administered 2018-07-28: 650 mg
  Filled 2018-07-28: qty 20.3

## 2018-07-28 MED ORDER — CLEVIDIPINE BUTYRATE 0.5 MG/ML IV EMUL
0.0000 mg/h | INTRAVENOUS | Status: DC
  Administered 2018-07-28: 8 mg/h via INTRAVENOUS
  Administered 2018-07-28: 7 mg/h via INTRAVENOUS
  Administered 2018-07-29: 12 mg/h via INTRAVENOUS
  Administered 2018-07-29: 16 mg/h via INTRAVENOUS
  Administered 2018-07-29: 12 mg/h via INTRAVENOUS
  Administered 2018-07-29: 16 mg/h via INTRAVENOUS
  Administered 2018-07-29: 8 mg/h via INTRAVENOUS
  Administered 2018-07-29: 14 mg/h via INTRAVENOUS
  Administered 2018-07-29: 10 mg/h via INTRAVENOUS
  Administered 2018-07-29: 16 mg/h via INTRAVENOUS
  Administered 2018-07-29: 6 mg/h via INTRAVENOUS
  Administered 2018-07-30: 8 mg/h via INTRAVENOUS
  Administered 2018-07-30: 11 mg/h via INTRAVENOUS
  Administered 2018-07-30: 8 mg/h via INTRAVENOUS
  Filled 2018-07-28 (×14): qty 50

## 2018-07-28 MED ORDER — ACETAMINOPHEN 325 MG PO TABS: 650.0000 mg | ORAL_TABLET | Freq: Four times a day (QID) | ORAL | Status: DC | PRN

## 2018-07-28 MED ORDER — VITAL HIGH PROTEIN PO LIQD
1000.0000 mL | ORAL | Status: DC
  Administered 2018-07-28: 1000 mL

## 2018-07-28 MED ORDER — METOPROLOL TARTRATE 50 MG PO TABS
50.0000 mg | ORAL_TABLET | Freq: Two times a day (BID) | ORAL | Status: DC
  Administered 2018-07-28 – 2018-08-06 (×19): 50 mg
  Filled 2018-07-28 (×22): qty 1

## 2018-07-28 MED ORDER — PRO-STAT SUGAR FREE PO LIQD
30.0000 mL | Freq: Two times a day (BID) | ORAL | Status: DC
  Administered 2018-07-28 – 2018-07-29 (×3): 30 mL
  Filled 2018-07-28 (×3): qty 30

## 2018-07-28 MED ORDER — LOSARTAN POTASSIUM 50 MG PO TABS
50.0000 mg | ORAL_TABLET | Freq: Two times a day (BID) | ORAL | Status: DC
  Administered 2018-07-28 (×2): 50 mg via ORAL
  Filled 2018-07-28 (×2): qty 1

## 2018-07-28 NOTE — Plan of Care (Signed)
?  Problem: Nutrition: ?Goal: Dietary intake will improve ?Outcome: Progressing ?  ?

## 2018-07-28 NOTE — Progress Notes (Signed)
STROKE TEAM PROGRESS NOTE   SUBJECTIVE (INTERVAL HISTORY) Her sister is at the bedside.  Patient still intubated, not on sedation, still on cleviprex. Off 3% saline and on 1/2NS now, Na 158. Will start tube feeding. Left pupil still dilated but reactive to light. More spontaneous movement than yesterday. Still not following commands though. Repeat CT this am showed no change of hematoma and slightly better MLS.   OBJECTIVE Vitals:   07/28/18 0615 07/28/18 0630 07/28/18 0645 07/28/18 0700  BP: (!) 143/75 139/71 (!) 142/70 16/10  Pulse:      Resp: (!) 22 (!) 22 (!) 22 (!) 22  Temp:      TempSrc:      SpO2:      Weight:      Height:        CBC:  Recent Labs  Lab 07/24/18 1950  07/27/18 0548 07/28/18 0600  WBC 10.1   < > 11.0* 8.5  NEUTROABS 8.7*  --   --   --   HGB 17.4*   < > 12.1 11.6*  HCT 54.5*   < > 39.2 37.8  MCV 92.7   < > 95.1 96.7  PLT 239   < > 159 142*   < > = values in this interval not displayed.    Basic Metabolic Panel:  Recent Labs  Lab 07/25/18 0528  07/27/18 0548  07/28/18 0000 07/28/18 0600  NA 147*   < > 157*   < > 158* 158*  K 3.9   < > 3.1*  --   --  3.8  CL 114*   < > 126*  --   --  128*  CO2 21*   < > 22  --   --  25  GLUCOSE 128*   < > 132*  --   --  147*  BUN 41*   < > 12  --   --  16  CREATININE 0.68   < > 0.50  --   --  0.73  CALCIUM 9.4   < > 8.9  --   --  9.1  MG 2.6*  --   --   --   --  2.4  PHOS 1.9*  --   --   --   --   --    < > = values in this interval not displayed.    Lipid Panel:     Component Value Date/Time   TRIG 16 07/26/2018 1331   HgbA1c:  Lab Results  Component Value Date   HGBA1C 5.7 (H) 07/25/2018   Urine Drug Screen: No results found for: LABOPIA, COCAINSCRNUR, LABBENZ, AMPHETMU, THCU, LABBARB  Alcohol Level No results found for: ETH  IMAGING   Ct Head Wo Contrast  Result Date: 07/26/2018 CLINICAL DATA:  72 y/o  F; acute hemorrhage for follow-up. EXAM: CT HEAD WITHOUT CONTRAST TECHNIQUE: Contiguous  axial images were obtained from the base of the skull through the vertex without intravenous contrast. COMPARISON:  07/25/2018 CT head.  07/25/2018 MRI head. FINDINGS: Brain: Stable left frontal hemorrhage measuring 4.5 x 3.9 x 3.9 cm (AP x ML x CC series 3, image 14 and series 5, image 16). Stable right anterior frontal hematoma measuring 21 mm (series 3, image 15). Stable left frontoparietal hemorrhage measuring 3.4 x 5.0 x 3.4 cm (AP x ML x CC series 5, image 45 and series 3, image 17). Stable 11 mm hemorrhage within the left lateral frontal lobe (series 3, image 17). Stable intraventricular hemorrhage  pooling in the occipital horns of the lateral ventricles. Stable subarachnoid hemorrhage predominantly in the left greater than right sylvian fissures and along the right anterior frontal lobe. Stable thin subdural hematoma along the left tentorium cerebelli. Stable 10 mm left-to-right midline shift of the anterior septum pellucidum. No new stroke, hemorrhage, focal mass effect, or herniation. Vascular: Calcific atherosclerosis of the carotid siphons. No hyperdense vessel identified. Skull: Normal. Negative for fracture or focal lesion. Sinuses/Orbits: No acute finding. Other: None. IMPRESSION: In comparison with the prior MRI of the head given differences in technique: 1. Stable acute hemorrhages within the left frontal lobe, right frontal lobe, left frontal parietal junction, and left lateral frontal lobe. 2. Stable intraventricular hemorrhage, thin subdural hematoma along the left tentorium, and small volume subarachnoid hemorrhage. 3. Stable 10 mm left-to-right midline shift of anterior septum pellucidum. 4. No new acute intracranial process identified. Electronically Signed   By: Mitzi Hansen M.D.   On: 07/26/2018 04:54   Mr Brain Wo Contrast  Result Date: 07/25/2018 CLINICAL DATA:  Acute mental status changes. Pulmonary embolus and DVT. EXAM: MRI HEAD WITHOUT CONTRAST TECHNIQUE: Multiplanar,  multiecho pulse sequences of the brain and surrounding structures were obtained without intravenous contrast. COMPARISON:  CT head without contrast 07/25/2018 12:25 a.m. FINDINGS: Brain: Multifocal areas of acute hemorrhage are present. Anterior left frontal lobe hemorrhage measures 4.0 x 4.5 x 4.3 Cm. A new hemorrhage in the posterior left frontal and left parietal lobe measures 5.5 x 3.0 x 3.5 cm. A smaller area of hemorrhage in the anterior inferior right frontal lobe measures 2.4 cm. Left right midline shift is 10 mm. There is effacement of the left lateral ventricle. Blood products are seen within the ventricles bilaterally. No hydrocephalus is evident. Posterior fossa is unremarkable. No acute parenchymal infarct is present otherwise. Vascular: Flow is present in the major intracranial arteries. Skull and upper cervical spine: Craniocervical junction is normal. Upper cervical spine is normal. Sinuses/Orbits: The left sphenoid sinus is partially opacified. No other significant paranasal sinus disease is present. Mastoid air cells are clear. Globes and orbits are within normal limits. IMPRESSION: 1. New large areas of hemorrhage in the anterior left frontal lobe and posterior left frontal and parietal lobe resulting in significant mass effect with left-to-right midline shift up to 10 mm. 2. Smaller area of hemorrhage in the anterior inferior right frontal lobe. 3. Intraventricular hemorrhage with fluid levels in the posterior horns of both lateral ventricles without hydrocephalus. Critical Value/emergent results were called by telephone at the time of interpretation on 07/25/2018 at 6:39 pm to Dr. Hartley Barefoot , who verbally acknowledged these results. Electronically Signed   By: Marin Roberts M.D.   On: 07/25/2018 19:01   Dg Chest Port 1 View  Result Date: 07/25/2018 CLINICAL DATA:  Status post intubation EXAM: PORTABLE CHEST 1 VIEW COMPARISON:  CT 07/25/2018 FINDINGS: Endotracheal tube with tip  4 cm from carina. Normal cardiac silhouette. Aorta is ectatic. Lungs are clear. IMPRESSION: Endotracheal tube in good position. Electronically Signed   By: Genevive Bi M.D.   On: 07/25/2018 20:49   Vas [place] Lower Extremity Venous (dvt)  Result Date: 07/25/2018  Lower Venous Study Indications: SOB, and pulmonary embolism.  Risk Factors: Confirmed PE. Performing Technologist: Toma Deiters RVS  Examination Guidelines: A complete evaluation includes B-mode imaging, spectral Doppler, color Doppler, and power Doppler as needed of all accessible portions of each vessel. Bilateral testing is considered an integral part of a complete examination. Limited examinations for reoccurring indications  may be performed as noted.  Right Venous Findings: +---------+---------------+---------+-----------+----------+------------------+          CompressibilityPhasicitySpontaneityPropertiesSummary            +---------+---------------+---------+-----------+----------+------------------+ CFV      Full           Yes      Yes                                     +---------+---------------+---------+-----------+----------+------------------+ SFJ      Full                                                            +---------+---------------+---------+-----------+----------+------------------+ FV Prox  Full           Yes      Yes                                     +---------+---------------+---------+-----------+----------+------------------+ FV Mid   Full                                                            +---------+---------------+---------+-----------+----------+------------------+ FV DistalFull           Yes      Yes                                     +---------+---------------+---------+-----------+----------+------------------+ PFV      Full           Yes      Yes                                      +---------+---------------+---------+-----------+----------+------------------+ POP      Partial        Yes      Yes                                     +---------+---------------+---------+-----------+----------+------------------+ PTV      Partial                                      Acute mid to                                                             proximal           +---------+---------------+---------+-----------+----------+------------------+ PERO  Difficult to                                                             visualize          +---------+---------------+---------+-----------+----------+------------------+ Gastroc  Partial                                      Acute              +---------+---------------+---------+-----------+----------+------------------+  Right Technical Findings: Difficult to position the leg for distal imaging due to dementia and patient poor to repond.  Left Venous Findings: +---------+---------------+---------+-----------+----------+-------------+          CompressibilityPhasicitySpontaneityPropertiesSummary       +---------+---------------+---------+-----------+----------+-------------+ CFV      Full           Yes      Yes                                +---------+---------------+---------+-----------+----------+-------------+ SFJ      Full                                                       +---------+---------------+---------+-----------+----------+-------------+ FV Prox  Full           Yes      Yes                                +---------+---------------+---------+-----------+----------+-------------+ FV Mid   Full                                                       +---------+---------------+---------+-----------+----------+-------------+ FV DistalFull           Yes      Yes                                 +---------+---------------+---------+-----------+----------+-------------+ PFV      Full           Yes      Yes                                +---------+---------------+---------+-----------+----------+-------------+ POP      Partial                                      Mid to distal +---------+---------------+---------+-----------+----------+-------------+ PTV      None                                                       +---------+---------------+---------+-----------+----------+-------------+  PERO     None                                                       +---------+---------------+---------+-----------+----------+-------------+  Left Technical Findings: Difficult to positin the lef for distal imaging due to dementia and patient poorto respond. Rouleux flow was noted in the proxiaml popliteal vein.   Summary: Right: Findings consistent with acute deep vein thrombosis involving the right gastrocnemius vein, right posterior tibial vein, and right popliteal vein. See technical findings listed above Left: Findings consistent with acute deep vein thrombosis involving the left peroneal vein, left posterior tibial vein, and left popliteal vein. See technical findings listed above  *See table(s) above for measurements and observations. Electronically signed by Sherald Hess MD on 07/25/2018 at 3:36:49 PM.    Final     Transthoracic Echocardiogram  Normal LV size with EF 65-70%. Normal RV size and systolic   function. Flattened mitral valve closure plane without frank   prolapse, mild to moderate mitral regurgitation. Mild pulmonary   hypertension.  Ct Head Wo Contrast  Result Date: 07/28/2018 CLINICAL DATA:  Follow-up of intracranial hemorrhage EXAM: CT HEAD WITHOUT CONTRAST TECHNIQUE: Contiguous axial images were obtained from the base of the skull through the vertex without intravenous contrast. COMPARISON:  None. FINDINGS: Brain: There is multifocal intraparenchymal  hemorrhage redemonstrated throughout the brain. The largest hematomas, in the left frontal lobe and at the left frontoparietal junction, are unchanged. The right frontal pole hematoma is also unchanged. Subarachnoid blood over the left convexity and in the right sylvian fissure is unchanged. The amount of intraventricular blood is unchanged. There is rightward midline shift that measures 6 mm, unchanged. Vascular: No abnormal hyperdensity of the major intracranial arteries or dural venous sinuses. No intracranial atherosclerosis. Skull: The visualized skull base, calvarium and extracranial soft tissues are normal. Sinuses/Orbits: No fluid levels or advanced mucosal thickening of the visualized paranasal sinuses. No mastoid or middle ear effusion. The orbits are normal. IMPRESSION: Unchanged appearance of multifocal intraparenchymal and extra-axial hemorrhage throughout the brain, with unchanged 6 mm rightward midline shift. Electronically Signed   By: Deatra Robinson M.D.   On: 07/28/2018 04:42    PHYSICAL EXAM  Temp:  [98.1 F (36.7 C)-100.1 F (37.8 C)] 99.4 F (37.4 C) (11/24 0400) Pulse Rate:  [81-113] 96 (11/24 0600) Resp:  [17-24] 22 (11/24 0700) BP: (95-148)/(54-83) 95/83 (11/24 0700) SpO2:  [91 %-100 %] 100 % (11/24 0600) Arterial Line BP: (113-128)/(55-60) 128/60 (11/23 1000) FiO2 (%):  [30 %] 30 % (11/24 0430)  General - Well nourished, well developed, intubated not on sedation.  Ophthalmologic - fundi not visualized due to noncooperation.  Cardiovascular - Regular rhythm, but mild tachycardia.  Neuro - intubated not on sedation, eyes closed, not open on voice but slightly open on pain stimulation.  Not following any commands.  Left pupil 4 mm, brisk to light, right pupil 2.5 mm, brisk to light.  Left gaze preference, no doll's eyes, bilateral corneal weak reflexes.  Gag and cough reflex present. LUE and LLE spontaneous movement more prominent today. On pain summation LUE withdraw and  localizing to pain, RUE trace withdraw.  LLE withdraws to pain 2/5 muscle strength, but RLE trace withdraw.  DTR 1+, bilateral Babinski positive. Sensation, coordination and gait not tested.   ASSESSMENT/PLAN Ms. Hannah Holloway  is a 72 y.o. female with history of dementia presenting with generalized weakness, found down at home, soaked in urine.  She was admitted to [facility] and found to have bilateral PE on chest CT and bilateral DVT and lower extremities.  She was put on heparin IV but then developed worsening mental status for which MRI was done showed left frontal anterior and posterior ICH with small right frontal ICH and IVH.  Patient transferred to [facility] for further evaluation.   ICH: Left anterior and posterior frontal large hematoma, right anterior frontal small hematoma, and IVH bilaterally.  Heparin was reversed with protamine.  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  Resultant unresponsive, intubated  CT head 07/25/2018 no acute abnormality  MRI head showed left anterior and posterior frontal large hematoma, right anterior frontal small hematoma and IVH. However, SWI sequence was not able to obtain due to noncooperation.  CT head repeat 07/26/18 confirmed ICH and IVH as above  CT repeat 08/27/18 stable hematoma and improved MLS  Neurosurgery consulted, Dr. Venetia Maxon recommended against surgical intervention.  2D Echo - EF 65 to 70%  HgbA1c 5.7  VTE prophylaxis - SCDs  No antithrombotic prior to admission, was treated with heparin IV for PE/DVT, now on No antithrombotic given ICH.  Ongoing aggressive stroke risk factor management  Therapy recommendations:  pending  Disposition:  Pending  Poor prognosis and son has transferred POA to pt brother Zola Button MD who is neurosurgeon in [place]. Pt another brother Myrtie Neither MD was orthopedics in [place].  Dr. Zola Button will arrive this pm and will have further discussion with him regarding treatment plan.   Cerebral edema and uncal herniation  CT and MRI showed midline shift 10 mm  anisocoria with L>R, but papillary reflex present bilaterally  CT repeat stable hematoma but improved MLS  Mannitol 1 dose given on admission  Currently off 3% saline  Pt has PICC line  Sodium 144->153->160->157->158  Sodium goal 150 - 160  Sodium check every 6 hours  Will start tube feeding today  Bilateral PE and DVT  Patient found down at home, soaked in urine  Chest CT showed bilateral upper lobe PE  LE venous Doppler showed bilateral DVT  Was on heparin IV which has been discontinued and reversed due to ICH  Respiratory distress  Intubated for not protecting airway  On ventilation  CCM on board  Sepsis  CT chest showed multifocal pneumonia  UA WBC 21-50  Urine culture showed gram-negative rods  Leukocytosis with WBC 11.1->11.0->8.5  Currently on azithromycin, Rocephin  Overnight Tmax 100.1  Hypertension  Stable on cleviprex . BP goal < 140 . Wean off cleviprex as able . Now on po meds with amlodipine, losartan and metoprolol . Long-term BP goal normotensive  Dysphagia   Did not pass swallow  Has OG tube  Will start TF today  Other Stroke Risk Factors  Advanced age  Other Active Problems  Baseline dementia    Hospital day # 3  This patient is critically ill due to extensive ICH, IVH, bilateral PE/DVT, cerebral edema and uncal herniation, sepsis with pneumonia and UTI and at significant risk of neurological worsening, death form uncal herniation, cerebral edema, heart failure, respiratory failure, seizure and sepsis as well as brain death. This patient's care requires constant monitoring of vital signs, hemodynamics, respiratory and cardiac monitoring, review of multiple databases, neurological assessment, discussion  with family, other specialists and  medical decision making of high complexity. I spent 40 minutes of neurocritical care time in the care of this patient. I had long discussion with her sister at bedside, updated pt current condition, treatment plan and potential prognosis. She expressed understanding and appreciation. I also discussed with Dr. Delton Coombes in the hallway.   Marvel Plan, MD PhD Stroke Neurology 07/28/2018 10:25 AM     To contact Stroke Continuity provider, please refer to [email or web address]. After hours, contact General Neurology

## 2018-07-28 NOTE — Plan of Care (Signed)
Met pt brothers Dr. Lenice Llamas, who is POA and neurosurgeon in [place], [place], and Dr. Marily Memos, who was orthopedic surgeon in [facility], at the bedside. We reviewed neuro images and discussed about treatment plan. We all agree with tube feeding to be started today. And Dr. Lenice Llamas also expressed desire to have early Trach and PEG to avoid ventilation related pneumonia. He will contact LTAC in [place] area to see if pt can be transferred over there once stable. Pt mom had similar situation more than 10 years ago and was treated here but Dr. Lenice Llamas was able to transfer her to [facility] in [place], and she was able to walk 2 years later. So family is hoping the same thing will happen for [facility].   I will sign out to Dr. Leonie Man and let him contact CCM team in am to discuss the possibility of early trach and PEG and start the process of transfer to [facility] in [place] once stable.   Rosalin Hawking, MD PhD Stroke Neurology 07/28/2018 12:52 PM

## 2018-07-28 NOTE — Plan of Care (Signed)
Started TF today.

## 2018-07-28 NOTE — Progress Notes (Signed)
..   NAME:  Hannah Holloway, MRN:  [id number], DOB:  05-09-1946, LOS: 3 ADMISSION DATE:  07/24/2018, CONSULTATION DATE:  07/25/18 REFERRING MD:  Tyrell Antonio, CHIEF COMPLAINT:  Altered mental status   Brief History   72 yr old female who was admitted on 07/24/18 for mgmt of Acute PE secondary to lower ext thrombosis from immobility started on Heparin gtt for anticoagulation. Experienced acute change in mental status while in MRI where pt was found to have a large left sided IPH and IVH with midline shift 10 mm.   Past Medical History  No significant PMHx .Marland Kitchen  Significant Hospital Events   11/20: 1.  Acute PE: Pulmonary emboli are seen in left upper lobe pulmonary arteries While evaluation of right-sided pulmonary arteries is limited due to streak artifact off of the SVC there still appears to be emboli in the right upper lobe pulmonary arterial branchesa. Significant artifact limits evaluation of the lower lobe branches. Number/21: Bilateral lower extremity DVTs: Vasc US 11/21-> Findings consistent with acute deep vein thrombosis involving the right gastrocnemius vein, right posterior tibial vein, and right popliteal vein. And acute deep vein thrombosis involving the left peroneal vein, left posterior tibial vein, and left popliteal vein. 11/21: New left sided hemorrhage in the anterior left frontal lobe and posterior left frontal and parietal lobe resulting in significant mass effect with left-to-right midline shift up to 10 mm.  Smaller area of hemorrhage in the anterior inferior right frontal lobe.Along with  Intraventricular hemorrhage with fluid levels in the posterior horns of both lateral ventricles without hydrocephalus.  Consults:  Neurology:11/21 Neurosurgery: 11/21 PCCM 11/21  Procedures:  Emergent Endotracheal intubation- Anesthesia 07/25/18   Significant Diagnostic Tests:  CT Angio Chest 07/25/18: IMPRESSION: 1. Pulmonary emboli in the upper lobes. Evaluation of lower  lobebranches is severely limited but no definitive lower lobe emboli are noted.2. Multifocal infiltrates, likely pneumonia. Recommend follow-up to resolution.3. Coronary artery calcifications. Atherosclerotic changes in the aorta. 4. Colonic diverticulosis without diverticulitis. 5. Fat stranding adjacent to the coccyx on axial image 82 isnonspecific.   Vascular Ultrasound 07/25/18: Summary: Right: Findings consistent with acute deep vein thrombosis involving the right gastrocnemius vein, right posterior tibial vein, and right popliteal vein. Left: Findings consistent with acute deep vein thrombosis involving the left peroneal vein, left posterior tibial vein, and left popliteal vein.   MRI 07/25/18:  IMPRESSION: 1. New large areas of hemorrhage in the anterior left frontal lobe and posterior left frontal and parietal lobe resulting in significant mass effect with left-to-right midline shift up to 10 mm. 2. Smaller area of hemorrhage in the anterior inferior right frontal lobe. 3. Intraventricular hemorrhage with fluid levels in the posterior horns of both lateral ventricles without hydrocephalus.  Head CT 07/28/2018: IMPRESSION: Unchanged appearance of multifocal intraparenchymal and extra-axial hemorrhage throughout the brain, with unchanged 6 mm rightward midline shift   Micro Data:  Blood cx drawn 07/24/18: NGTD Urine cx drawn 07/25/18: E coli>>>  Antimicrobials:  Rocephin 07/25/18 Azithromycin 07/25/18  Interim history/subjective:  No significant neurological changes reported Her hypertonic saline has now been transitioned to half-normal saline She had a repeat head CT done today as detailed above  Objective   Blood pressure 135/76, pulse 97, temperature (!) 100.8 F (38.2 C), temperature source Axillary, resp. rate (!) 21, height '5\' 6"'$  (1.676 m), weight 72.6 kg, SpO2 100 %.    Vent Mode: PRVC FiO2 (%):  [30 %] 30 % Set Rate:  [12 bmp] 12 bmp Vt Set:  [470 mL]  470  mL PEEP:  [5 Orange Pressure:  [13 cmH20-15 cmH20] 14 cmH20   Intake/Output Summary (Last 24 hours) at 07/28/2018 1229 Last data filed at 07/28/2018 1200 Gross per 24 hour  Intake 1814.56 ml  Output 1125 ml  Net 689.56 ml   Filed Weights   07/24/18 1918  Weight: 72.6 kg    Examination: General: Ill-appearing elderly woman, intubated HEENT left pupil is larger than the right, both react Neuro: She will withdraw from having her eyes open, may turn her head towards painful stimulus, she moved her left upper extremity to pain, nothing on the right pulmonary: Clear bilaterally Cardiac: Regular, no murmur, no significant edema Abd: Soft, no apparent tenderness, nondistended, positive bowel sounds  Assessment & Plan:   Acute Intraparenchymal and Intraventricular Hemorrhage Now w/ cerebral edema and uncal herniation.   s/p anticoagulation for Acute Pulmonary embolus and lower ext thrombosis.-NSG consulted and pt is not a candidate for surgical intervention at this time. -concern for underlying amyloid  - got mannitol 11/20, now on HT saline  Plan Poor prognosis here based on neurosurgery and neurology evaluations.  No neurosurgical intervention planned Hypertonic saline discontinued, transitioned over to half-normal saline.  Following sodium On Cleviprex, tight blood pressure control Continue discussions with family regarding goals of care.  Dr. Erlinda Hong has met the patient's son who would like to defer medical state decision-making to the patient's brothers both of whom are physicians.  Acute Pulmonary Embolism- thrombotic involvement seen in both left and right pulmonary tree.  - Pt has no known h/o malignancy or coagulopathy prior to this admission. 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  PRVC as her rest mode.  She does not have the mental status for extubation.  If plan is to push forward aggressively then she will need tracheostomy and PEG. VAP prevention orders Hold all sedating medications  Multifocal infiltrates ->Community Acquired Pneumonia: Plan Continue ceftriaxone, azithromycin.  Day 5  Ecoli UTI, pansensitive, ESBL negative Plan On ceftriaxone  Fluid and Electrolyte imbalance: Hypokalemia Plan Intentional hypernatremia, now on half-normal saline Follow BMP    Best practice:  Diet: TF Pain/Anxiety/Delirium protocol (if indicated): Minimizing sedation as able VAP protocol (if indicated): YES DVT prophylaxis: NONE- massive hemorrhage GI prophylaxis: pepcid Glucose control: SSI Mobility: bedrest Code Status: Full Code Family: Dr. Erlinda Hong has discussed the prognosis and goals with the patient's family.  Her brothers are both physicians, 1 of whom is a Publishing rights manager.  Her son has deferred decision making going forward to the brothers.  At this point they remain hopeful that there may be some degree of neurological recovery. No plans to scale back care at this time.   Family Communication and SW:  Lives with older sister and prior to admission was completely independent   Works at [employer] and last was at work 07/21/18         Contact numbers:  Brother: Eddie Dibbles [place]: [phone] Brother- Particia Nearing [phone] and [phone] home Sis-n-law- Donia Pounds [phone] Brother- Barnabas Lister [phone] Phone: [phone] Arnell Sieving (brother) Children- son Randall Hiss: no phone number available, family members are looking Disposition: ICU transfer to [facility]  Brother updated at bedside by Dr. Lamonte Sakai on 11/23, [place] on 11/24  Independent critical care time 32 minutes  Baltazar Apo, MD, PhD 07/28/2018, 12:29 PM [facility] [phone]  or if no answer [phone]

## 2018-07-29 ENCOUNTER — Inpatient Hospital Stay (HOSPITAL_COMMUNITY): Payer: Medicare Other

## 2018-07-29 ENCOUNTER — Encounter (HOSPITAL_COMMUNITY): Payer: Self-pay | Admitting: Physician Assistant

## 2018-07-29 HISTORY — PX: IR IVC FILTER PLMT / S&I /IMG GUID/MOD SED: IMG701

## 2018-07-29 LAB — CBC
HEMATOCRIT: 37.7 % (ref 36.0–46.0)
Hemoglobin: 11.2 g/dL — ABNORMAL LOW (ref 12.0–15.0)
MCH: 29.1 pg (ref 26.0–34.0)
MCHC: 29.7 g/dL — AB (ref 30.0–36.0)
MCV: 97.9 fL (ref 80.0–100.0)
NRBC: 0.1 % (ref 0.0–0.2)
PLATELETS: 133 10*3/uL — AB (ref 150–400)
RBC: 3.85 MIL/uL — ABNORMAL LOW (ref 3.87–5.11)
RDW: 16 % — ABNORMAL HIGH (ref 11.5–15.5)
WBC: 14 10*3/uL — ABNORMAL HIGH (ref 4.0–10.5)

## 2018-07-29 LAB — BASIC METABOLIC PANEL
Anion gap: 6 (ref 5–15)
BUN: 20 mg/dL (ref 8–23)
CALCIUM: 9.5 mg/dL (ref 8.9–10.3)
CHLORIDE: 122 mmol/L — AB (ref 98–111)
CO2: 29 mmol/L (ref 22–32)
CREATININE: 0.65 mg/dL (ref 0.44–1.00)
GFR calc Af Amer: >60 mL/min (ref >60)
GFR calc non Af Amer: >60 mL/min (ref >60)
GLUCOSE: 186 mg/dL — AB (ref 70–99)
Potassium: 3.5 mmol/L (ref 3.5–5.1)
Sodium: 157 mmol/L — ABNORMAL HIGH (ref 135–145)

## 2018-07-29 LAB — GLUCOSE, CAPILLARY
GLUCOSE-CAPILLARY: 132 mg/dL — AB (ref 70–99)
GLUCOSE-CAPILLARY: 74 mg/dL (ref 70–99)
Glucose-Capillary: 90 mg/dL (ref 70–99)
Glucose-Capillary: 117 mg/dL — ABNORMAL HIGH (ref 70–99)
Glucose-Capillary: 95 mg/dL (ref 70–99)
Glucose-Capillary: 70 mg/dL (ref 70–99)

## 2018-07-29 LAB — CULTURE, BLOOD (ROUTINE X 2)
Culture: NO GROWTH
Culture: NO GROWTH
SPECIAL REQUESTS: ADEQUATE
Special Requests: ADEQUATE

## 2018-07-29 LAB — MAGNESIUM
Magnesium: 2.3 mg/dL (ref 1.7–2.4)
Magnesium: 2.3 mg/dL (ref 1.7–2.4)

## 2018-07-29 LAB — SODIUM
Sodium: 153 mmol/L — ABNORMAL HIGH (ref 135–145)
Sodium: 157 mmol/L — ABNORMAL HIGH (ref 135–145)
Sodium: 157 mmol/L — ABNORMAL HIGH (ref 135–145)
Sodium: 158 mmol/L — ABNORMAL HIGH (ref 135–145)

## 2018-07-29 LAB — PHOSPHORUS
Phosphorus: 4.1 mg/dL (ref 2.5–4.6)
Phosphorus: 3.8 mg/dL (ref 2.5–4.6)

## 2018-07-29 MED ORDER — IOPAMIDOL (ISOVUE-300) INJECTION 61%
INTRAVENOUS | Status: AC
  Administered 2018-07-29: 45 mL
  Filled 2018-07-29: qty 100

## 2018-07-29 MED ORDER — FAMOTIDINE 40 MG/5ML PO SUSR
20.0000 mg | Freq: Two times a day (BID) | ORAL | Status: DC
  Administered 2018-07-29 – 2018-08-09 (×21): 20 mg
  Filled 2018-07-29 (×25): qty 2.5

## 2018-07-29 MED ORDER — LIDOCAINE HCL 1 % IJ SOLN
INTRAMUSCULAR | Status: AC
  Filled 2018-07-29: qty 20

## 2018-07-29 MED ORDER — FENTANYL CITRATE (PF) 100 MCG/2ML IJ SOLN
INTRAMUSCULAR | Status: AC
  Filled 2018-07-29: qty 2

## 2018-07-29 MED ORDER — ACETAMINOPHEN 650 MG RE SUPP: 650.0000 mg | Freq: Four times a day (QID) | RECTAL | Status: DC | PRN

## 2018-07-29 MED ORDER — LOSARTAN POTASSIUM 50 MG PO TABS
50.0000 mg | ORAL_TABLET | Freq: Two times a day (BID) | ORAL | Status: DC
  Administered 2018-07-29 – 2018-08-06 (×18): 50 mg
  Filled 2018-07-29 (×20): qty 1

## 2018-07-29 MED ORDER — LIDOCAINE HCL 1 % IJ SOLN
INTRAMUSCULAR | Status: AC | PRN
  Administered 2018-07-29: 10 mL

## 2018-07-29 MED ORDER — MIDAZOLAM HCL 2 MG/2ML IJ SOLN
INTRAMUSCULAR | Status: AC | PRN
  Administered 2018-07-29: 1 mg via INTRAVENOUS

## 2018-07-29 MED ORDER — ETOMIDATE 2 MG/ML IV SOLN
40.0000 mg | Freq: Once | INTRAVENOUS | Status: DC
  Filled 2018-07-29: qty 20

## 2018-07-29 MED ORDER — VECURONIUM BROMIDE 10 MG IV SOLR
10.0000 mg | Freq: Once | INTRAVENOUS | Status: DC
  Filled 2018-07-29: qty 10

## 2018-07-29 MED ORDER — AMLODIPINE BESYLATE 10 MG PO TABS
10.0000 mg | ORAL_TABLET | Freq: Every day | ORAL | Status: DC
  Administered 2018-07-29 – 2018-08-06 (×9): 10 mg
  Filled 2018-07-29 (×10): qty 1

## 2018-07-29 MED ORDER — FAMOTIDINE IN NACL 20-0.9 MG/50ML-% IV SOLN
20.0000 mg | INTRAVENOUS | Status: DC
  Administered 2018-07-29: 20 mg via INTRAVENOUS
  Filled 2018-07-29: qty 50

## 2018-07-29 MED ORDER — PRO-STAT SUGAR FREE PO LIQD
30.0000 mL | Freq: Every day | ORAL | Status: DC
  Administered 2018-07-31 – 2018-08-09 (×10): 30 mL
  Filled 2018-07-29 (×10): qty 30

## 2018-07-29 MED ORDER — PROPOFOL 10 MG/ML IV BOLUS: 500.0000 mg | Freq: Once | INTRAVENOUS | Status: DC

## 2018-07-29 MED ORDER — FENTANYL CITRATE (PF) 100 MCG/2ML IJ SOLN
INTRAMUSCULAR | Status: AC | PRN
  Administered 2018-07-29: 50 ug via INTRAVENOUS

## 2018-07-29 MED ORDER — FENTANYL CITRATE (PF) 100 MCG/2ML IJ SOLN
200.0000 ug | Freq: Once | INTRAMUSCULAR | Status: DC
  Filled 2018-07-29: qty 4

## 2018-07-29 MED ORDER — MIDAZOLAM HCL 2 MG/2ML IJ SOLN
5.0000 mg | Freq: Once | INTRAMUSCULAR | Status: DC
  Filled 2018-07-29: qty 6

## 2018-07-29 MED ORDER — ACETAMINOPHEN 325 MG PO TABS
650.0000 mg | ORAL_TABLET | Freq: Four times a day (QID) | ORAL | Status: DC | PRN
  Administered 2018-07-29 – 2018-08-02 (×8): 650 mg
  Filled 2018-07-29 (×8): qty 2

## 2018-07-29 MED ORDER — OSMOLITE 1.2 CAL PO LIQD
1000.0000 mL | ORAL | Status: DC
  Administered 2018-07-31 – 2018-08-09 (×10): 1000 mL
  Filled 2018-07-29 (×19): qty 1000

## 2018-07-29 MED ORDER — IPRATROPIUM-ALBUTEROL 0.5-2.5 (3) MG/3ML IN SOLN: 3.0000 mL | RESPIRATORY_TRACT | Status: DC | PRN

## 2018-07-29 MED ORDER — MIDAZOLAM HCL 2 MG/2ML IJ SOLN
INTRAMUSCULAR | Status: AC
  Filled 2018-07-29: qty 2

## 2018-07-29 NOTE — Progress Notes (Signed)
Orthopedic Tech Progress Note Patient Details:  Hannah Holloway 1946-03-05 [id number]  Ortho Devices Ortho Device/Splint Location: Bilateral Prafo boots Ortho Device/Splint Interventions: Application       Saul FordyceJennifer C Scheryl Sanborn 07/29/2018, 10:17 AM

## 2018-07-29 NOTE — Evaluation (Signed)
Occupational Therapy Evaluation Patient Details Name: Hannah Holloway MRN: [id number] DOB: 1945/11/15 Today's Date: 07/29/2018    History of Present Illness Hannah Holloway is a 72 y.o. female with history of dementia who was found down at home. She was admitted to [facility] and found to have bilateral PE on chest CT and bilateral DVT and lower extremities. She was put on heparin IV but then developed worsening mental status. Neuro imaging showing Intraventricular hemorrhage, and large areas of hemorrhage in the anterior left frontal lobe and posterior left frontal and parietal lobe resulting in significant mass effect with left-to-right midline shift up to 10 mm. Family interested in pursuing medical and rehab efforts; for IVC filter and gastrostomy tube by IR.  has no past medical history on file.   Clinical Impression   Pt admitted with above and limited by problem list below.  Cleared for mobilization prior to eval with OT/PT.  Patient unable to provide PLOF or home setup, per chart review lives with sister and family is interested in pursuing relatively aggressive medical and rehab care.  Patient currently requires total +2-3 assist for bed mobility and self care at this time due to significant cognitive and functional deficits, unable to open eyes during session, or follow commands, but spontaneously moving L UE and noted initation of postural muscles seated EOB.  Patient will benefit from continued OT services while admitted in order to maximize return to PLOF with ADLs and mobility.  Will continue to follow.      Follow Up Recommendations  LTACH;Supervision/Assistance - 24 hour    Equipment Recommendations  Other (comment)(TBD at next venue of care)    Recommendations for Other Services       Precautions / Restrictions Precautions Precautions: Fall Precaution Comments: Multiple lines/leads; VDRF with ETT as of 11/25 Restrictions Weight Bearing Restrictions: No       Mobility Bed Mobility Overal bed mobility: Needs Assistance Bed Mobility: Supine to Sit;Sit to Supine     Supine to sit: Total assist;+2 for physical assistance Sit to supine: Total assist;+2 for physical assistance   General bed mobility comments: total assist for bed mobility with +2-3 for safety.  R lateral and posterior lean with sitting EOB and no increase in arousal.   Transfers                 General transfer comment: deferred due to safety    Balance Overall balance assessment: Needs assistance Sitting-balance support: Feet supported Sitting balance-Leahy Scale: Zero Sitting balance - Comments: sitting EOB with total assist R lateral lean and some activation of postural musculature noted at times, diminshed with fatigue; L sided trunk tremor Postural control: Posterior lean;Right lateral lean                                 ADL either performed or assessed with clinical judgement   ADL Overall ADL's : Needs assistance/impaired     Grooming: Total assistance;Bed level   Upper Body Bathing: Total assistance;Bed level   Lower Body Bathing: Total assistance;+2 for physical assistance;Bed level   Upper Body Dressing : Total assistance;Bed level   Lower Body Dressing: Total assistance;Bed level               Functional mobility during ADLs: Total assistance;+2 for physical assistance General ADL Comments: requires total assist for bed mobility and self care at this time, pt with no initation of self care  tasks      Vision   Additional Comments: continue to assess, maintains eyes closed during session     Perception     Praxis      Pertinent Vitals/Pain Pain Assessment: (monitored) Pain Location: generalized; watched closely for grimacing; minimal, if any; noted cough-like movements of trunk initially upon sitting up; VSS Pain Intervention(s): Repositioned     Hand Dominance     Extremity/Trunk Assessment Upper Extremity  Assessment Upper Extremity Assessment: RUE deficits/detail;LUE deficits/detail;Difficult to assess due to impaired cognition RUE Deficits / Details: no active gross movement noted, flaccid, no response to stimuli; edema  LUE Deficits / Details: pt actively and spontaneously moving L UE but not purposefully   Lower Extremity Assessment Lower Extremity Assessment: Defer to PT evaluation RLE Deficits / Details: Good positioning of feet in bil PRAFOs; PROM WFL hip, knee, and ankle; no gross active movement or muscle activation appreciated during evaluation LLE Deficits / Details: Good positioning of feet in bil PRAFOs; PROM WFL hip, knee, and ankle; no gross active movement or muscle activation appreciated during evaluation   Cervical / Trunk Assessment Cervical / Trunk Assessment: Other exceptions Cervical / Trunk Exceptions: Noted R lean sitting up and EOB, with some trunk muscle recruitment in response to movement/position change   Communication Communication Communication: Receptive difficulties;Expressive difficulties   Cognition Arousal/Alertness: Lethargic   Overall Cognitive Status: Difficult to assess Area of Impairment: Following commands;Attention;Memory;Problem solving                   Current Attention Level: (minimal reponse to stimuli) Memory: (noted history of dementia)       Problem Solving: Decreased initiation General Comments: Minimal response to stimuli; Brief periods of Eyelid flutter with movement/position change, but did not fully open eyes, even sitting EOB   General Comments  VSS, repositioned for comfort and edema mgmt     Exercises     Shoulder Instructions      Home Living Family/patient expects to be discharged to:: Other (Comment)([facility]) Living Arrangements: Other relatives(lives with sister)                                      Prior Functioning/Environment          Comments: Patient unable to give Prior Level of  Function, and family unavailable        OT Problem List: Decreased strength;Decreased activity tolerance;Impaired balance (sitting and/or standing);Impaired vision/perception;Decreased coordination;Decreased cognition;Decreased safety awareness;Decreased knowledge of use of DME or AE;Decreased knowledge of precautions;Impaired sensation;Impaired tone;Impaired UE functional use;Increased edema      OT Treatment/Interventions: Self-care/ADL training;Therapeutic exercise;Energy conservation;Neuromuscular education;Manual therapy;Splinting;Therapeutic activities;Patient/family education;Balance training;Visual/perceptual remediation/compensation;Cognitive remediation/compensation    OT Goals(Current goals can be found in the care plan section) Acute Rehab OT Goals Patient Stated Goal: Unable to state OT Goal Formulation: Patient unable to participate in goal setting Time For Goal Achievement: 08/12/18 Potential to Achieve Goals: Fair  OT Frequency: Min 2X/week   Barriers to D/C:            Co-evaluation PT/OT/SLP Co-Evaluation/Treatment: Yes Reason for Co-Treatment: Complexity of the patient's impairments (multi-system involvement);Necessary to address cognition/behavior during functional activity;For patient/therapist safety;To address functional/ADL transfers   OT goals addressed during session: ADL's and self-care;Strengthening/ROM      AM-PAC OT "6 Clicks" Daily Activity     Outcome Measure Help from another person eating meals?: Total Help from another person taking  care of personal grooming?: Total Help from another person toileting, which includes using toliet, bedpan, or urinal?: Total Help from another person bathing (including washing, rinsing, drying)?: Total Help from another person to put on and taking off regular upper body clothing?: Total Help from another person to put on and taking off regular lower body clothing?: Total 6 Click Score: 6   End of Session Nurse  Communication: Mobility status  Activity Tolerance: Patient limited by lethargy Patient left: in bed;with call bell/phone within reach;with bed alarm set;with restraints reapplied  OT Visit Diagnosis: Other abnormalities of gait and mobility (R26.89);Other symptoms and signs involving the nervous system (R29.898);Other symptoms and signs involving cognitive function                Time: [id number] OT Time Calculation (min): 26 min Charges:  OT General Charges $OT Visit: 1 Visit OT Evaluation $OT Eval High Complexity: 1 High  Chancy Milroyhristie S Toddrick Sanna, OT Acute Rehabilitation Services Pager [phone] Office [phone]   Chancy MilroyChristie S Ritu Gagliardo 07/29/2018, 2:31 PM

## 2018-07-29 NOTE — Evaluation (Signed)
Physical Therapy Evaluation Patient Details Name: Hannah EveryLucille Holloway MRN: [id number] DOB: [birth year] Today's Date: 07/29/2018   History of Present Illness  Hannah Holloway is a 72 y.o. female with history of dementia who was found down at home. She was admitted to [facility] and found to have bilateral PE on chest CT and bilateral DVT and lower extremities. She was put on heparin IV but then developed worsening mental status. Neuro imaging showing Intraventricular hemorrhage, and large areas of hemorrhage in the anterior left frontal lobe and posterior left frontal and parietal lobe resulting in significant mass effect with left-to-right midline shift up to 10 mm. Family interested in pursuing medical and rehab efforts; for IVC filter and gastrostomy tube by IR.  has no past medical history on file.  Clinical Impression   Pt admitted with above diagnosis. Pt currently with functional limitations due to the deficits listed below (see PT Problem List). Will need to get more information re: prior level of function from family; Gleaned from the chart she lives with her sister; noted family is interested in pursuing relatively aggressive medical and rehabilitative care, and they would like to look into going [facility] [place] closer to one of her brothers; Presents with significnat cognitive and functional deficits, weakness, decr responsiveness to stimuli;  Pt will benefit from skilled PT to increase their safety with mobility, capture improvements/changes in cognition, and optimize rehabilitative care to allow discharge to the venue listed below.       Follow Up Recommendations [facility]    Equipment Recommendations  Other (comment)(to be determined as pt progresses)    Recommendations for Other Services       Precautions / Restrictions Precautions Precautions: Fall Precaution Comments: Multiple lines/leads; VDRF with ETT as of 11/25      Mobility  Bed Mobility Overal bed  mobility: Needs Assistance Bed Mobility: Supine to Sit;Sit to Supine     Supine to sit: Total assist;+2 for physical assistance(+3 helpful) Sit to supine: Total assist;+2 for physical assistance(+3 helpful)   General bed mobility comments: Total assist for all aspects of bed mobility; 3rd person helpful for lines, leads, ETT; Sat EOB approx 5-7 minutes with Max/Tot A for balance sitting up; Obtained BP in sitting; Noted eyelid flicker with movement, but not fully opening eyes; assisted back to supine and repositioned  Transfers                    Ambulation/Gait                Stairs            Wheelchair Mobility    Modified Rankin (Stroke Patients Only) Modified Rankin (Stroke Patients Only) Pre-Morbid Rankin Score: No significant disability(Need more information; this is best inference) Modified Rankin: Severe disability     Balance Overall balance assessment: Needs assistance Sitting-balance support: Feet supported Sitting balance-Leahy Scale: Zero Sitting balance - Comments: Sat EOB approx 5-7 minutes with Max/Total assist; Noting R trunk lean; some recruitmant of posutral muscles noted; Cough-like motions of trunk noted initially upon sitting, they diminished with time; L sided tremor noted L side trunk and shoulder girdle with incr time sitting up                                     Pertinent Vitals/Pain Pain Assessment: (Monitored) Pain Location: generalized; watched closely for grimacing; minimal, if any; noted  cough-like movements of trunk initially upon sitting up; VSS Pain Intervention(s): Repositioned    Home Living Family/patient expects to be discharged to:: Other (Comment)([facility]) Living Arrangements: Other relatives(lives with sister)                    Prior Function           Comments: Patient unable to give Prior Level of Function, and family unavailable     Hand Dominance        Extremity/Trunk  Assessment   Upper Extremity Assessment Upper Extremity Assessment: Defer to OT evaluation    Lower Extremity Assessment Lower Extremity Assessment: Difficult to assess due to impaired cognition;RLE deficits/detail;LLE deficits/detail RLE Deficits / Details: Good positioning of feet in bil PRAFOs; PROM WFL hip, knee, and ankle; no gross active movement or muscle activation appreciated during evaluation LLE Deficits / Details: Good positioning of feet in bil PRAFOs; PROM WFL hip, knee, and ankle; no gross active movement or muscle activation appreciated during evaluation    Cervical / Trunk Assessment Cervical / Trunk Assessment: Other exceptions Cervical / Trunk Exceptions: Noted R lean sitting up and EOB, with some trunk muscle recruitment in response to movement/position change  Communication   Communication: Receptive difficulties;Expressive difficulties  Cognition Arousal/Alertness: Lethargic   Overall Cognitive Status: Difficult to assess Area of Impairment: Following commands;Attention;Memory;Problem solving                   Current Attention Level: (Minimal response to stimuli) Memory: (Noted history of dementia)       Problem Solving: Decreased initiation General Comments: Minimal response to stimuli; Brief periods of Eyelid flutter with movement/position change, but did not fully open eyes, even sitting EOB      General Comments General comments (skin integrity, edema, etc.): Session conducted on full vent support; VSS; BP supine 131/75, BP sitting 121/89, BP back supine 135/67; HR 91 back supine, O2 sats at or near 100% throughout session    Exercises     Assessment/Plan    PT Assessment Patient needs continued PT services  PT Problem List Decreased strength;Decreased range of motion;Decreased activity tolerance;Decreased balance;Decreased mobility;Decreased coordination;Decreased cognition;Decreased knowledge of use of DME;Decreased safety  awareness;Decreased knowledge of precautions;Cardiopulmonary status limiting activity;Impaired sensation;Impaired tone       PT Treatment Interventions Functional mobility training;Therapeutic activities;Therapeutic exercise;Balance training;Neuromuscular re-education;Cognitive remediation;Patient/family education;Wheelchair mobility training    PT Goals (Current goals can be found in the Care Plan section)  Acute Rehab PT Goals Patient Stated Goal: Unable to state PT Goal Formulation: Patient unable to participate in goal setting Time For Goal Achievement: 08/12/18 Potential to Achieve Goals: Fair Additional Goals Additional Goal #1: Pt will follow 2/3 simple commands    Frequency Min 2X/week   Barriers to discharge        Co-evaluation               AM-PAC PT "6 Clicks" Mobility  Outcome Measure Help needed turning from your back to your side while in a flat bed without using bedrails?: Total Help needed moving from lying on your back to sitting on the side of a flat bed without using bedrails?: Total Help needed moving to and from a bed to a chair (including a wheelchair)?: Total Help needed standing up from a chair using your arms (e.g., wheelchair or bedside chair)?: Total Help needed to walk in hospital room?: Total Help needed climbing 3-5 steps with a railing? : Total 6 Click Score: 6  End of Session Equipment Utilized During Treatment: Other (comment)(bed pad, PRAFOs) Activity Tolerance: Patient tolerated treatment well Patient left: in bed;with call bell/phone within reach;Other (comment)(Repositioned) Nurse Communication: Mobility status PT Visit Diagnosis: Other symptoms and signs involving the nervous system (R29.898);Hemiplegia and hemiparesis Hemiplegia - caused by: Nontraumatic intracerebral hemorrhage;Other Nontraumatic intracranial hemorrhage    Time: 4098-1191 PT Time Calculation (min) (ACUTE ONLY): 26 min   Charges:   PT Evaluation $PT Eval  High Complexity: 1 High          Van Clines, PT  Acute Rehabilitation Services Pager [phone] Office [phone]   Hannah Holloway 07/29/2018, 2:18 PM

## 2018-07-29 NOTE — Progress Notes (Signed)
Initial Nutrition Assessment  DOCUMENTATION CODES:   Not applicable  INTERVENTION:   Once ready to resume TF after PEG placement:   Osmolite 1.2 @ 55 ml/hr (1320 ml/day) 30 ml Prostat daily  Provides: 1684 kcal, 88 grams protein, and 1070 ml free water.    NUTRITION DIAGNOSIS:   Inadequate oral intake related to inability to eat as evidenced by NPO status.  GOAL:   Patient will meet greater than or equal to 90% of their needs  MONITOR:   TF tolerance  REASON FOR ASSESSMENT:   Consult, Ventilator Enteral/tube feeding initiation and management  ASSESSMENT:   Pt with PMH of dementia who was found down at home. [facility] with [facility] PE secondary to LE DVT from immobility. Pt started on heparin and developed a large L IVH with 10 mm shift.    Pt discussed during ICU rounds and with RN.  Pt started TF via adult enteral TF protocol starting 11/24. She is now NPO with plans for PEG 11/26.   Patient is currently intubated on ventilator support MV: 11.4 L/min Temp (24hrs), Avg:100.6 F (38.1 C), Min:99.3 F (37.4 C), Max:102.6 F (39.2 C)  Cleviprex - just turned off Medications reviewed  Labs reviewed: Na 158 (H)    NUTRITION - FOCUSED PHYSICAL EXAM:    Most Recent Value  Orbital Region  No depletion  Upper Arm Region  No depletion  Thoracic and Lumbar Region  No depletion  Buccal Region  Unable to assess  Temple Region  Mild depletion  Clavicle Bone Region  No depletion  Clavicle and Acromion Bone Region  No depletion  Scapular Bone Region  Unable to assess  Dorsal Hand  No depletion  Patellar Region  No depletion  Anterior Thigh Region  No depletion  Posterior Calf Region  No depletion  Edema (RD Assessment)  Mild  Hair  Reviewed  Eyes  Unable to assess  Mouth  Unable to assess  Skin  Reviewed  Nails  Reviewed       Diet Order:   Diet Order            Diet NPO time specified  Diet effective midnight        Diet NPO time specified   Diet effective now              EDUCATION NEEDS:   No education needs have been identified at this time  Skin:  Skin Assessment: Reviewed RN Assessment  Last BM:  unknown  Height:   Ht Readings from Last 1 Encounters:  07/24/18 5\' 6"  (1.676 m)    Weight:   Wt Readings from Last 1 Encounters:  07/24/18 72.6 kg    Ideal Body Weight:  59 kg  BMI:  Body mass index is 25.82 kg/m.  Estimated Nutritional Needs:   Kcal:  1701  Protein:  87-100 grams  Fluid:  >1.7 L/day  Kendell BaneHeather Mckynzi Cammon RD, LDN, CNSC [phone] Pager [phone] After Hours Pager

## 2018-07-29 NOTE — H&P (Signed)
Chief Complaint: DVT/PE Dysphagia  Referring Physician(s): Coralyn Helling  Supervising Physician: Oley Balm  Patient Status: [facility] - In-pt  History of Present Illness: Hannah Holloway is a 72 y.o. female with history of dementia who was found down at home.  She was admitted to [facility] and found to have bilateral PE on chest CT and bilateral DVT and lower extremities.    She was put on heparin IV but then developed worsening mental status.  MRI = IMPRESSION: 1. New large areas of hemorrhage in the anterior left frontal lobe and posterior left frontal and parietal lobe resulting in significant mass effect with left-to-right midline shift up to 10 mm. 2. Smaller area of hemorrhage in the anterior inferior right frontal lobe. 3. Intraventricular hemorrhage with fluid levels in the posterior horns of both lateral ventricles without hydrocephalus.  We are asked to place an IVC filter and a gastrostomy tube.  History reviewed. No pertinent past medical history.  History reviewed. No pertinent surgical history.  Allergies: Patient has no known allergies.  Medications: Prior to Admission medications   Medication Sig Start Date End Date Taking? Authorizing Provider  Cholecalciferol (VITAMIN D3) 25 MCG (1000 UT) CAPS Take 1,000 Units by mouth daily.   Yes [provider]  vitamin E 400 UNIT capsule Take 400 Units by mouth daily.   Yes [provider]     Family History  Problem Relation Age of Onset  . Prostate cancer Father   . Prostate cancer Brother   . CAD Neg Hx     Social History   Socioeconomic History  . Marital status: Single    Spouse name: Not on file  . Number of children: Not on file  . Years of education: Not on file  . Highest education level: Not on file  Occupational History  . Not on file  Social Needs  . Financial resource strain: Not on file  . Food insecurity:    Worry: Not on file    Inability: Not on  file  . Transportation needs:    Medical: Not on file    Non-medical: Not on file  Tobacco Use  . Smoking status: Never Smoker  . Smokeless tobacco: Never Used  Substance and Sexual Activity  . Alcohol use: Never    Frequency: Never  . Drug use: Never  . Sexual activity: Not on file  Lifestyle  . Physical activity:    Days per week: Not on file    Minutes per session: Not on file  . Stress: Not on file  Relationships  . Social connections:    Talks on phone: Not on file    Gets together: Not on file    Attends religious service: Not on file    Active member of club or organization: Not on file    Attends meetings of clubs or organizations: Not on file    Relationship status: Not on file  Other Topics Concern  . Not on file  Social History Narrative  . Not on file    Review of Systems  Unable to perform ROS: Intubated    Vital Signs: BP 126/71   Pulse 83   Temp 99.3 F (37.4 C) (Axillary)   Resp (!) 26   Ht 5\' 6"  (1.676 m)   Wt 72.6 kg   SpO2 (!) 88%   BMI 25.82 kg/m   Physical Exam  Constitutional:  Intubated/sedated/ill appearing  HENT:  Head: Normocephalic.  Cardiovascular: Normal rate  and regular rhythm.  Pulmonary/Chest: Breath sounds normal.  Intubated on vent  Abdominal: Soft.  No scars  Neurological:  sedated  Skin: Skin is warm and dry.  Psychiatric:  Unable to  examine  Vitals reviewed.   Imaging: Dg Abd 1 View  Result Date: 07/27/2018 CLINICAL DATA:  OG tube placement. EXAM: ABDOMEN - 1 VIEW COMPARISON:  None. FINDINGS: An OG tube is identified with tip overlying the proximal to mid stomach. Bowel gas pattern is unremarkable. IMPRESSION: OG tube with tip overlying the proximal to mid stomach. Electronically Signed   By: Harmon Pier M.D.   On: 07/27/2018 12:27   Ct Head Wo Contrast  Result Date: 07/28/2018 CLINICAL DATA:  Follow-up of intracranial hemorrhage EXAM: CT HEAD WITHOUT CONTRAST TECHNIQUE: Contiguous axial images were  obtained from the base of the skull through the vertex without intravenous contrast. COMPARISON:  None. FINDINGS: Brain: There is multifocal intraparenchymal hemorrhage redemonstrated throughout the brain. The largest hematomas, in the left frontal lobe and at the left frontoparietal junction, are unchanged. The right frontal pole hematoma is also unchanged. Subarachnoid blood over the left convexity and in the right sylvian fissure is unchanged. The amount of intraventricular blood is unchanged. There is rightward midline shift that measures 6 mm, unchanged. Vascular: No abnormal hyperdensity of the major intracranial arteries or dural venous sinuses. No intracranial atherosclerosis. Skull: The visualized skull base, calvarium and extracranial soft tissues are normal. Sinuses/Orbits: No fluid levels or advanced mucosal thickening of the visualized paranasal sinuses. No mastoid or middle ear effusion. The orbits are normal. IMPRESSION: Unchanged appearance of multifocal intraparenchymal and extra-axial hemorrhage throughout the brain, with unchanged 6 mm rightward midline shift. Electronically Signed   By: Deatra Robinson M.D.   On: 07/28/2018 04:42   Ct Head Wo Contrast  Result Date: 07/26/2018 CLINICAL DATA:  72 y/o  F; acute hemorrhage for follow-up. EXAM: CT HEAD WITHOUT CONTRAST TECHNIQUE: Contiguous axial images were obtained from the base of the skull through the vertex without intravenous contrast. COMPARISON:  07/25/2018 CT head.  07/25/2018 MRI head. FINDINGS: Brain: Stable left frontal hemorrhage measuring 4.5 x 3.9 x 3.9 cm (AP x ML x CC series 3, image 14 and series 5, image 16). Stable right anterior frontal hematoma measuring 21 mm (series 3, image 15). Stable left frontoparietal hemorrhage measuring 3.4 x 5.0 x 3.4 cm (AP x ML x CC series 5, image 45 and series 3, image 17). Stable 11 mm hemorrhage within the left lateral frontal lobe (series 3, image 17). Stable intraventricular hemorrhage  pooling in the occipital horns of the lateral ventricles. Stable subarachnoid hemorrhage predominantly in the left greater than right sylvian fissures and along the right anterior frontal lobe. Stable thin subdural hematoma along the left tentorium cerebelli. Stable 10 mm left-to-right midline shift of the anterior septum pellucidum. No new stroke, hemorrhage, focal mass effect, or herniation. Vascular: Calcific atherosclerosis of the carotid siphons. No hyperdense vessel identified. Skull: Normal. Negative for fracture or focal lesion. Sinuses/Orbits: No acute finding. Other: None. IMPRESSION: In comparison with the prior MRI of the head given differences in technique: 1. Stable acute hemorrhages within the left frontal lobe, right frontal lobe, left frontal parietal junction, and left lateral frontal lobe. 2. Stable intraventricular hemorrhage, thin subdural hematoma along the left tentorium, and small volume subarachnoid hemorrhage. 3. Stable 10 mm left-to-right midline shift of anterior septum pellucidum. 4. No new acute intracranial process identified. Electronically Signed   By: Mitzi Hansen M.D.   On:  07/26/2018 04:54   Ct Head Wo Contrast  Result Date: 07/25/2018 CLINICAL DATA:  Altered mental status EXAM: CT HEAD WITHOUT CONTRAST TECHNIQUE: Contiguous axial images were obtained from the base of the skull through the vertex without intravenous contrast. COMPARISON:  None. FINDINGS: Brain: No subdural, epidural, or subarachnoid hemorrhage. Cerebellum, brainstem, and basal cisterns are normal. Ventricles and sulci are mildly prominent. No acute cortical ischemia or infarct. No mass effect or midline shift. Vascular: Calcified atherosclerosis in the intracranial carotids. Skull: Normal. Negative for fracture or focal lesion. Sinuses/Orbits: No acute finding. Other: None. IMPRESSION: No acute intracranial abnormalities identified. Electronically Signed   By: Gerome Samavid  Williams III M.D   On:  07/25/2018 00:53   Ct Angio Chest Pe W Or Wo Contrast  Result Date: 07/25/2018 CLINICAL DATA:  Weakness. Question diverticulitis. Elevated white blood cell count. EXAM: CT ANGIOGRAPHY CHEST CT ABDOMEN AND PELVIS WITH CONTRAST TECHNIQUE: Multidetector CT imaging of the chest was performed using the standard protocol during bolus administration of intravenous contrast. Multiplanar CT image reconstructions and MIPs were obtained to evaluate the vascular anatomy. Multidetector CT imaging of the abdomen and pelvis was performed using the standard protocol during bolus administration of intravenous contrast. CONTRAST:  100mL ISOVUE-370 IOPAMIDOL (ISOVUE-370) INJECTION 76% COMPARISON:  Chest x-ray July 24, 2018 FINDINGS: CTA CHEST FINDINGS Cardiovascular: The thoracic aorta demonstrates atherosclerosis with no aneurysm or dissection. The heart size is normal. Evaluation of the pulmonary arteries is limited due to significant stairstep artifact from respiratory motion. Pulmonary emboli are seen in left upper lobe pulmonary arteries such as on series 7, image 140 and coronal images 56 and 53. Evaluation of right-sided pulmonary arteries is limited due to streak artifact off of the SVC. That being said, there appear to be emboli in the right upper lobe pulmonary arterial branchesa. Significant artifact limits evaluation of the lower lobe branches. Mediastinum/Nodes: The thyroid and esophagus are normal. No adenopathy or effusion. Lungs/Pleura: Infiltrates seen in the right upper lobe, the superior segment of the right lower lobe, and the left lower lobe. Central airways are normal. No pneumothorax. No suspicious nodules or masses. Musculoskeletal: See below. Review of the MIP images confirms the above findings. CT ABDOMEN and PELVIS FINDINGS Hepatobiliary: Probable small hepatic cysts, too small to characterize. No suspicious masses. The portal vein is unremarkable. The gallbladder is unremarkable. Pancreas:  Unremarkable. No pancreatic ductal dilatation or surrounding inflammatory changes. Spleen: Normal in size without focal abnormality. Adrenals/Urinary Tract: Adrenal glands are normal. Probable tiny cyst in the right kidney, too small to characterize. No suspicious masses or hydronephrosis. No filling defects in the upper collecting systems on delayed images. No ureteral stones are noted. The bladder is distended but otherwise unremarkable. Stomach/Bowel: The stomach and small bowel are normal. Colonic diverticulosis is seen without diverticulitis. The colon is otherwise normal. Limited views of the appendix are normal. Vascular/Lymphatic: Atherosclerotic changes are seen in the nonaneurysmal aorta. No adenopathy. Reproductive: The uterus and ovaries are normal. Other: No free air or free fluid. Increased attenuation in the fat adjacent to the coccyx as seen on axial image 82. Musculoskeletal: Degenerative changes are seen in the hips bilaterally. No acute bony abnormalities. Review of the MIP images confirms the above findings. IMPRESSION: 1. Pulmonary emboli in the upper lobes. Evaluation of lower lobe branches is severely limited but no definitive lower lobe emboli are noted. 2. Multifocal infiltrates, likely pneumonia. Recommend follow-up to resolution. 3. Coronary artery calcifications. Atherosclerotic changes in the aorta. 4. Colonic diverticulosis without diverticulitis. 5.  Fat stranding adjacent to the coccyx on axial image 82 is nonspecific. Findings called to Dr. Read Drivers. Electronically Signed   By: Gerome Sam III M.D   On: 07/25/2018 02:49   Mr Brain Wo Contrast  Result Date: 07/25/2018 CLINICAL DATA:  Acute mental status changes. Pulmonary embolus and DVT. EXAM: MRI HEAD WITHOUT CONTRAST TECHNIQUE: Multiplanar, multiecho pulse sequences of the brain and surrounding structures were obtained without intravenous contrast. COMPARISON:  CT head without contrast 07/25/2018 12:25 a.m. FINDINGS: Brain:  Multifocal areas of acute hemorrhage are present. Anterior left frontal lobe hemorrhage measures 4.0 x 4.5 x 4.3 Cm. A new hemorrhage in the posterior left frontal and left parietal lobe measures 5.5 x 3.0 x 3.5 cm. A smaller area of hemorrhage in the anterior inferior right frontal lobe measures 2.4 cm. Left right midline shift is 10 mm. There is effacement of the left lateral ventricle. Blood products are seen within the ventricles bilaterally. No hydrocephalus is evident. Posterior fossa is unremarkable. No acute parenchymal infarct is present otherwise. Vascular: Flow is present in the major intracranial arteries. Skull and upper cervical spine: Craniocervical junction is normal. Upper cervical spine is normal. Sinuses/Orbits: The left sphenoid sinus is partially opacified. No other significant paranasal sinus disease is present. Mastoid air cells are clear. Globes and orbits are within normal limits. IMPRESSION: 1. New large areas of hemorrhage in the anterior left frontal lobe and posterior left frontal and parietal lobe resulting in significant mass effect with left-to-right midline shift up to 10 mm. 2. Smaller area of hemorrhage in the anterior inferior right frontal lobe. 3. Intraventricular hemorrhage with fluid levels in the posterior horns of both lateral ventricles without hydrocephalus. Critical Value/emergent results were called by telephone at the time of interpretation on 07/25/2018 at 6:39 pm to Dr. Hartley Barefoot , who verbally acknowledged these results. Electronically Signed   By: Marin Roberts M.D.   On: 07/25/2018 19:01   Mr Lumbar Spine W Wo Contrast  Result Date: 07/25/2018 CLINICAL DATA:  Generalized weakness for 3 days EXAM: MRI LUMBAR SPINE WITHOUT AND WITH CONTRAST TECHNIQUE: Multiplanar and multiecho pulse sequences of the lumbar spine were obtained without and with intravenous contrast. CONTRAST:  7 cc Gadavist intravenous COMPARISON:  None. FINDINGS: Segmentation:  5  lumbar type vertebral bodies Alignment:  Normal Vertebrae: No fracture, evidence of discitis, or aggressive bone lesion. Presumed bone island in the L1 body. Sacral meningeal cyst with prominent scalloping at the level of the S2 and S3 bodies, with imperceptible cortex at some levels. Conus medullaris and cauda equina: Conus extends to the L1-2 level. Conus and cauda equina appear normal. Specifically the nerve roots are not thickened or enhancing for Guillain-Barre. There is dependent lobulated material within the thecal sac at L5 and below, which is nonenhancing. Paraspinal and other soft tissues: Full urinary bladder. Sigmoid diverticulosis. Disc levels: Good disc height and hydration for age. Mild generalized facet spurring. No impingement. IMPRESSION: 1. No definitive cause of weakness. The cauda equina conus has a normal appearance and there is no compressive stenosis. 2. Unusual material in the lower and dependent thecal sac which could be debris or scarring related to notably expansile sacral Tarlov cysts. Please correlate for trauma history or meningitis symptoms. Lumbar puncture may be helpful, although complicated by acute PE. 3. History of incontinence. There is moderate distension of the urinary bladder-consider checking postvoid residual. Electronically Signed   By: Marnee Spring M.D.   On: 07/25/2018 07:13   Ct Abdomen Pelvis W  Contrast  Result Date: 07/25/2018 CLINICAL DATA:  Weakness. Question diverticulitis. Elevated white blood cell count. EXAM: CT ANGIOGRAPHY CHEST CT ABDOMEN AND PELVIS WITH CONTRAST TECHNIQUE: Multidetector CT imaging of the chest was performed using the standard protocol during bolus administration of intravenous contrast. Multiplanar CT image reconstructions and MIPs were obtained to evaluate the vascular anatomy. Multidetector CT imaging of the abdomen and pelvis was performed using the standard protocol during bolus administration of intravenous contrast. CONTRAST:   ISOVUE-370 IOPAMIDOL (ISOVUE-370) INJECTION 76% COMPARISON:  Chest x-ray July 24, 2018 FINDINGS: CTA CHEST FINDINGS Cardiovascular: The thoracic aorta demonstrates atherosclerosis with no aneurysm or dissection. The heart size is normal. Evaluation of the pulmonary arteries is limited due to significant stairstep artifact from respiratory motion. Pulmonary emboli are seen in left upper lobe pulmonary arteries such as on series 7, image 140 and coronal images 56 and 53. Evaluation of right-sided pulmonary arteries is limited due to streak artifact off of the SVC. That being said, there appear to be emboli in the right upper lobe pulmonary arterial branchesa. Significant artifact limits evaluation of the lower lobe branches. Mediastinum/Nodes: The thyroid and esophagus are normal. No adenopathy or effusion. Lungs/Pleura: Infiltrates seen in the right upper lobe, the superior segment of the right lower lobe, and the left lower lobe. Central airways are normal. No pneumothorax. No suspicious nodules or masses. Musculoskeletal: See below. Review of the MIP images confirms the above findings. CT ABDOMEN and PELVIS FINDINGS Hepatobiliary: Probable small hepatic cysts, too small to characterize. No suspicious masses. The portal vein is unremarkable. The gallbladder is unremarkable. Pancreas: Unremarkable. No pancreatic ductal dilatation or surrounding inflammatory changes. Spleen: Normal in size without focal abnormality. Adrenals/Urinary Tract: Adrenal glands are normal. Probable tiny cyst in the right kidney, too small to characterize. No suspicious masses or hydronephrosis. No filling defects in the upper collecting systems on delayed images. No ureteral stones are noted. The bladder is distended but otherwise unremarkable. Stomach/Bowel: The stomach and small bowel are normal. Colonic diverticulosis is seen without diverticulitis. The colon is otherwise normal. Limited views of the appendix are normal.  Vascular/Lymphatic: Atherosclerotic changes are seen in the nonaneurysmal aorta. No adenopathy. Reproductive: The uterus and ovaries are normal. Other: No free air or free fluid. Increased attenuation in the fat adjacent to the coccyx as seen on axial image 82. Musculoskeletal: Degenerative changes are seen in the hips bilaterally. No acute bony abnormalities. Review of the MIP images confirms the above findings. IMPRESSION: 1. Pulmonary emboli in the upper lobes. Evaluation of lower lobe branches is severely limited but no definitive lower lobe emboli are noted. 2. Multifocal infiltrates, likely pneumonia. Recommend follow-up to resolution. 3. Coronary artery calcifications. Atherosclerotic changes in the aorta. 4. Colonic diverticulosis without diverticulitis. 5. Fat stranding adjacent to the coccyx on axial image 82 is nonspecific. Findings called to Dr. Read Drivers. Electronically Signed   By: Gerome Sam III M.D   On: 07/25/2018 02:49   Dg Chest Port 1 View  Result Date: 07/29/2018 CLINICAL DATA:  Acute respiratory failure EXAM: PORTABLE CHEST 1 VIEW COMPARISON:  Portable chest x-ray of July 25, 2018 FINDINGS: The patient is mildly rotated on this study. The lungs are well-expanded. There is no focal infiltrate. There is no pleural effusion or pneumothorax. The endotracheal tube tip projects approximately 3.9 cm above the carina. The heart and pulmonary vascularity are normal. There is calcification in the wall of the thoracic aorta. The PICC line tip projects over the distal third of the SVC.  The esophagogastric tube tip and proximal port project below the GE junction. IMPRESSION: There is no active cardiopulmonary disease. There is reasonable positioning of the support tubes. Electronically Signed   By: David  [place] M.D.   On: 07/29/2018 08:51   Dg Chest Port 1 View  Result Date: 07/25/2018 CLINICAL DATA:  Status post intubation EXAM: PORTABLE CHEST 1 VIEW COMPARISON:  CT 07/25/2018 FINDINGS:  Endotracheal tube with tip 4 cm from carina. Normal cardiac silhouette. Aorta is ectatic. Lungs are clear. IMPRESSION: Endotracheal tube in good position. Electronically Signed   By: Genevive Bi M.D.   On: 07/25/2018 20:49   Dg Chest Port 1 View  Result Date: 07/24/2018 CLINICAL DATA:  Weakness EXAM: PORTABLE CHEST 1 VIEW COMPARISON:  11/21/2012 FINDINGS: New elevation of the right hemidiaphragm. Accentuated fullness in the AP window region compared to prior. Atherosclerotic calcification of the aortic arch. IMPRESSION: 1. New elevation of the right hemidiaphragm. 2. Accentuated fullness in the AP window region compared to prior, cannot exclude adenopathy or mass. Consider chest CT for further evaluation. Electronically Signed   By: Gaylyn Rong M.D.   On: 07/24/2018 20:34   Vas [place] Lower Extremity Venous (dvt)  Result Date: 07/25/2018  Lower Venous Study Indications: SOB, and pulmonary embolism.  Risk Factors: Confirmed PE. Performing Technologist: Toma Deiters RVS  Examination Guidelines: A complete evaluation includes B-mode imaging, spectral Doppler, color Doppler, and power Doppler as needed of all accessible portions of each vessel. Bilateral testing is considered an integral part of a complete examination. Limited examinations for reoccurring indications may be performed as noted.  Right Venous Findings: +---------+---------------+---------+-----------+----------+------------------+          CompressibilityPhasicitySpontaneityPropertiesSummary            +---------+---------------+---------+-----------+----------+------------------+ CFV      Full           Yes      Yes                                     +---------+---------------+---------+-----------+----------+------------------+ SFJ      Full                                                            +---------+---------------+---------+-----------+----------+------------------+ FV Prox  Full           Yes       Yes                                     +---------+---------------+---------+-----------+----------+------------------+ FV Mid   Full                                                            +---------+---------------+---------+-----------+----------+------------------+ FV DistalFull           Yes      Yes                                     +---------+---------------+---------+-----------+----------+------------------+  PFV      Full           Yes      Yes                                     +---------+---------------+---------+-----------+----------+------------------+ POP      Partial        Yes      Yes                                     +---------+---------------+---------+-----------+----------+------------------+ PTV      Partial                                      Acute mid to                                                             proximal           +---------+---------------+---------+-----------+----------+------------------+ PERO                                                  Difficult to                                                             visualize          +---------+---------------+---------+-----------+----------+------------------+ Gastroc  Partial                                      Acute              +---------+---------------+---------+-----------+----------+------------------+  Right Technical Findings: Difficult to position the leg for distal imaging due to dementia and patient poor to repond.  Left Venous Findings: +---------+---------------+---------+-----------+----------+-------------+          CompressibilityPhasicitySpontaneityPropertiesSummary       +---------+---------------+---------+-----------+----------+-------------+ CFV      Full           Yes      Yes                                +---------+---------------+---------+-----------+----------+-------------+ SFJ      Full                                                        +---------+---------------+---------+-----------+----------+-------------+ FV Prox  Full           Yes      Yes                                +---------+---------------+---------+-----------+----------+-------------+  FV Mid   Full                                                       +---------+---------------+---------+-----------+----------+-------------+ FV DistalFull           Yes      Yes                                +---------+---------------+---------+-----------+----------+-------------+ PFV      Full           Yes      Yes                                +---------+---------------+---------+-----------+----------+-------------+ POP      Partial                                      Mid to distal +---------+---------------+---------+-----------+----------+-------------+ PTV      None                                                       +---------+---------------+---------+-----------+----------+-------------+ PERO     None                                                       +---------+---------------+---------+-----------+----------+-------------+  Left Technical Findings: Difficult to positin the lef for distal imaging due to dementia and patient poorto respond. Rouleux flow was noted in the proxiaml popliteal vein.   Summary: Right: Findings consistent with acute deep vein thrombosis involving the right gastrocnemius vein, right posterior tibial vein, and right popliteal vein. See technical findings listed above Left: Findings consistent with acute deep vein thrombosis involving the left peroneal vein, left posterior tibial vein, and left popliteal vein. See technical findings listed above  *See table(s) above for measurements and observations. Electronically signed by Sherald Hess MD on 07/25/2018 at 3:36:49 PM.    Final    [place] Ekg Site Rite  Result Date: 07/27/2018 If Site Rite image not  attached, placement could not be confirmed due to current cardiac rhythm.   Labs:  CBC: Recent Labs    07/25/18 0528 07/27/18 0548 07/28/18 0600 07/29/18 0557  WBC 11.1* 11.0* 8.5 14.0*  HGB 14.3 12.1 11.6* 11.2*  HCT 43.6 39.2 37.8 37.7  PLT 188 159 142* 133*    COAGS: Recent Labs    07/24/18 1950 07/25/18 2019 07/28/18 0600  INR 1.09 1.09 1.34    BMP: Recent Labs    07/26/18 0846  07/27/18 0548  07/28/18 0600  07/28/18 1734 07/28/18 2345 07/29/18 0557 07/29/18 0600  NA 153*   < > 157*   < > 158*   < > 156* 153* 157* 157*  K 3.2*  --  3.1*  --  3.8  --   --   --  3.5  --   CL 125*  --  126*  --  128*  --   --   --  122*  --   CO2 21*  --  22  --  25  --   --   --  29  --   GLUCOSE 144*  --  132*  --  147*  --   --   --  186*  --   BUN 16  --  12  --  16  --   --   --  20  --   CALCIUM 8.9  --  8.9  --  9.1  --   --   --  9.5  --   CREATININE 0.61  --  0.50  --  0.73  --   --   --  0.65  --   GFRNONAA >60  --  >60  --  >60  --   --   --  >60  --   GFRAA >60  --  >60  --  >60  --   --   --  >60  --    < > = values in this interval not displayed.    LIVER FUNCTION TESTS: Recent Labs    07/24/18 1950 07/25/18 0528  BILITOT 1.2 1.5*  AST 22 25  ALT 18 17  ALKPHOS 67 53  PROT 9.2* 7.8  ALBUMIN 3.8 3.1*    TUMOR MARKERS: No results for input(s): AFPTM, CEA, CA199, CHROMGRNA in the last 8760 hours.  Assessment and Plan:  DVT and PE with development of intracranial hemorrhage on IV heparin.  Will proceed with placement of IVC filter.  Risks and benefits discussed with the patient including, but not limited to bleeding, infection, contrast induced renal failure, filter fracture or migration which can lead to emergency surgery or even death, strut penetration with damage or irritation to adjacent structures and caval thrombosis.  All of the patient's questions were answered, patient is agreeable to proceed. Consent signed and in  chart.  Dysphagia/Vent dependence  Will place gastrostomy tube.  Risks and benefits discussed with the patient including, but not limited to the need for a barium enema during the procedure, bleeding, infection, peritonitis, or damage to adjacent structures.  All of the patient's questions were answered, patient is agreeable to proceed. Consent signed and in chart.   Thank you for this interesting consult.  I greatly enjoyed meeting Hannah Holloway and look forward to participating in their care.  A copy of this report was sent to the requesting provider on this date.  Electronically Signed: Gwynneth Macleod, PA-C   07/29/2018, 11:25 AM      I spent a total of 40 Minutes in face to face in clinical consultation, greater than 50% of which was counseling/coordinating care for IVC filter and Gtube.

## 2018-07-29 NOTE — Progress Notes (Signed)
STROKE TEAM PROGRESS NOTE   SUBJECTIVE (INTERVAL HISTORY) Her sister in law  is at the bedside.  Patient still intubated, not on sedation, still on cleviprex. Off 3% saline   Na 157. Exam unchanged. Still not following commands  . Repeat CT 07/28/18 showed no change of hematoma and slightly better midline shift.   OBJECTIVE Vitals:   07/29/18 1315 07/29/18 1330 07/29/18 1345 07/29/18 1400  BP: (!) 117/57 (!) 114/58 (!) 115/58 (!) 119/58  Pulse:   85   Resp: (!) 23 (!) 21 (!) 23 (!) 23  Temp:      TempSrc:      SpO2:   100%   Weight:      Height:        CBC:  Recent Labs  Lab 07/24/18 1950  07/28/18 0600 07/29/18 0557  WBC 10.1   < > 8.5 14.0*  NEUTROABS 8.7*  --   --   --   HGB 17.4*   < > 11.6* 11.2*  HCT 54.5*   < > 37.8 37.7  MCV 92.7   < > 96.7 97.9  PLT 239   < > 142* 133*   < > = values in this interval not displayed.    Basic Metabolic Panel:  Recent Labs  Lab 07/28/18 0600  07/28/18 1734  07/29/18 0557 07/29/18 0600 07/29/18 1200  NA 158*   < > 156*   < > 157* 157* 158*  K 3.8  --   --   --  3.5  --   --   CL 128*  --   --   --  122*  --   --   CO2 25  --   --   --  29  --   --   GLUCOSE 147*  --   --   --  186*  --   --   BUN 16  --   --   --  20  --   --   CREATININE 0.73  --   --   --  0.65  --   --   CALCIUM 9.1  --   --   --  9.5  --   --   MG 2.4   < > 2.3  --  2.3  --   --   PHOS  --    < > 2.4*  --  4.1  --   --    < > = values in this interval not displayed.    Lipid Panel:     Component Value Date/Time   TRIG 16 07/26/2018 1331   HgbA1c:  Lab Results  Component Value Date   HGBA1C 5.7 (H) 07/25/2018   Urine Drug Screen: No results found for: LABOPIA, COCAINSCRNUR, LABBENZ, AMPHETMU, THCU, LABBARB  Alcohol Level No results found for: ETH  IMAGING   Ct Head Wo Contrast  Result Date: 07/26/2018 CLINICAL DATA:  72 y/o  F; acute hemorrhage for follow-up. EXAM: CT HEAD WITHOUT CONTRAST TECHNIQUE: Contiguous axial images were  obtained from the base of the skull through the vertex without intravenous contrast. COMPARISON:  07/25/2018 CT head.  07/25/2018 MRI head. FINDINGS: Brain: Stable left frontal hemorrhage measuring 4.5 x 3.9 x 3.9 cm (AP x ML x CC series 3, image 14 and series 5, image 16). Stable right anterior frontal hematoma measuring 21 mm (series 3, image 15). Stable left frontoparietal hemorrhage measuring 3.4 x 5.0 x 3.4 cm (AP x ML x CC series 5,  image 45 and series 3, image 17). Stable 11 mm hemorrhage within the left lateral frontal lobe (series 3, image 17). Stable intraventricular hemorrhage pooling in the occipital horns of the lateral ventricles. Stable subarachnoid hemorrhage predominantly in the left greater than right sylvian fissures and along the right anterior frontal lobe. Stable thin subdural hematoma along the left tentorium cerebelli. Stable 10 mm left-to-right midline shift of the anterior septum pellucidum. No new stroke, hemorrhage, focal mass effect, or herniation. Vascular: Calcific atherosclerosis of the carotid siphons. No hyperdense vessel identified. Skull: Normal. Negative for fracture or focal lesion. Sinuses/Orbits: No acute finding. Other: None. IMPRESSION: In comparison with the prior MRI of the head given differences in technique: 1. Stable acute hemorrhages within the left frontal lobe, right frontal lobe, left frontal parietal junction, and left lateral frontal lobe. 2. Stable intraventricular hemorrhage, thin subdural hematoma along the left tentorium, and small volume subarachnoid hemorrhage. 3. Stable 10 mm left-to-right midline shift of anterior septum pellucidum. 4. No new acute intracranial process identified. Electronically Signed   By: Mitzi Hansen M.D.   On: 07/26/2018 04:54   Mr Brain Wo Contrast  Result Date: 07/25/2018 CLINICAL DATA:  Acute mental status changes. Pulmonary embolus and DVT. EXAM: MRI HEAD WITHOUT CONTRAST TECHNIQUE: Multiplanar, multiecho pulse  sequences of the brain and surrounding structures were obtained without intravenous contrast. COMPARISON:  CT head without contrast 07/25/2018 12:25 a.m. FINDINGS: Brain: Multifocal areas of acute hemorrhage are present. Anterior left frontal lobe hemorrhage measures 4.0 x 4.5 x 4.3 Cm. A new hemorrhage in the posterior left frontal and left parietal lobe measures 5.5 x 3.0 x 3.5 cm. A smaller area of hemorrhage in the anterior inferior right frontal lobe measures 2.4 cm. Left right midline shift is 10 mm. There is effacement of the left lateral ventricle. Blood products are seen within the ventricles bilaterally. No hydrocephalus is evident. Posterior fossa is unremarkable. No acute parenchymal infarct is present otherwise. Vascular: Flow is present in the major intracranial arteries. Skull and upper cervical spine: Craniocervical junction is normal. Upper cervical spine is normal. Sinuses/Orbits: The left sphenoid sinus is partially opacified. No other significant paranasal sinus disease is present. Mastoid air cells are clear. Globes and orbits are within normal limits. IMPRESSION: 1. New large areas of hemorrhage in the anterior left frontal lobe and posterior left frontal and parietal lobe resulting in significant mass effect with left-to-right midline shift up to 10 mm. 2. Smaller area of hemorrhage in the anterior inferior right frontal lobe. 3. Intraventricular hemorrhage with fluid levels in the posterior horns of both lateral ventricles without hydrocephalus. Critical Value/emergent results were called by telephone at the time of interpretation on 07/25/2018 at 6:39 pm to Dr. Hartley Barefoot , who verbally acknowledged these results. Electronically Signed   By: Marin Roberts M.D.   On: 07/25/2018 19:01   Dg Chest Port 1 View  Result Date: 07/25/2018 CLINICAL DATA:  Status post intubation EXAM: PORTABLE CHEST 1 VIEW COMPARISON:  CT 07/25/2018 FINDINGS: Endotracheal tube with tip 4 cm from  carina. Normal cardiac silhouette. Aorta is ectatic. Lungs are clear. IMPRESSION: Endotracheal tube in good position. Electronically Signed   By: Genevive Bi M.D.   On: 07/25/2018 20:49   Vas [place] Lower Extremity Venous (dvt)  Result Date: 07/25/2018  Lower Venous Study Indications: SOB, and pulmonary embolism.  Risk Factors: Confirmed PE. Performing Technologist: Toma Deiters RVS  Examination Guidelines: A complete evaluation includes B-mode imaging, spectral Doppler, color Doppler, and power Doppler as  needed of all accessible portions of each vessel. Bilateral testing is considered an integral part of a complete examination. Limited examinations for reoccurring indications may be performed as noted.  Right Venous Findings: +---------+---------------+---------+-----------+----------+------------------+          CompressibilityPhasicitySpontaneityPropertiesSummary            +---------+---------------+---------+-----------+----------+------------------+ CFV      Full           Yes      Yes                                     +---------+---------------+---------+-----------+----------+------------------+ SFJ      Full                                                            +---------+---------------+---------+-----------+----------+------------------+ FV Prox  Full           Yes      Yes                                     +---------+---------------+---------+-----------+----------+------------------+ FV Mid   Full                                                            +---------+---------------+---------+-----------+----------+------------------+ FV DistalFull           Yes      Yes                                     +---------+---------------+---------+-----------+----------+------------------+ PFV      Full           Yes      Yes                                     +---------+---------------+---------+-----------+----------+------------------+  POP      Partial        Yes      Yes                                     +---------+---------------+---------+-----------+----------+------------------+ PTV      Partial                                      Acute mid to                                                             proximal           +---------+---------------+---------+-----------+----------+------------------+  PERO                                                  Difficult to                                                             visualize          +---------+---------------+---------+-----------+----------+------------------+ Gastroc  Partial                                      Acute              +---------+---------------+---------+-----------+----------+------------------+  Right Technical Findings: Difficult to position the leg for distal imaging due to dementia and patient poor to repond.  Left Venous Findings: +---------+---------------+---------+-----------+----------+-------------+          CompressibilityPhasicitySpontaneityPropertiesSummary       +---------+---------------+---------+-----------+----------+-------------+ CFV      Full           Yes      Yes                                +---------+---------------+---------+-----------+----------+-------------+ SFJ      Full                                                       +---------+---------------+---------+-----------+----------+-------------+ FV Prox  Full           Yes      Yes                                +---------+---------------+---------+-----------+----------+-------------+ FV Mid   Full                                                       +---------+---------------+---------+-----------+----------+-------------+ FV DistalFull           Yes      Yes                                +---------+---------------+---------+-----------+----------+-------------+ PFV      Full           Yes       Yes                                +---------+---------------+---------+-----------+----------+-------------+ POP      Partial  Mid to distal +---------+---------------+---------+-----------+----------+-------------+ PTV      None                                                       +---------+---------------+---------+-----------+----------+-------------+ PERO     None                                                       +---------+---------------+---------+-----------+----------+-------------+  Left Technical Findings: Difficult to positin the lef for distal imaging due to dementia and patient poorto respond. Rouleux flow was noted in the proxiaml popliteal vein.   Summary: Right: Findings consistent with acute deep vein thrombosis involving the right gastrocnemius vein, right posterior tibial vein, and right popliteal vein. See technical findings listed above Left: Findings consistent with acute deep vein thrombosis involving the left peroneal vein, left posterior tibial vein, and left popliteal vein. See technical findings listed above  *See table(s) above for measurements and observations. Electronically signed by Sherald Hesshristopher Clark MD on 07/25/2018 at 3:36:49 PM.    Final     Transthoracic Echocardiogram  Normal LV size with EF 65-70%. Normal RV size and systolic   function. Flattened mitral valve closure plane without frank   prolapse, mild to moderate mitral regurgitation. Mild pulmonary   hypertension.  Ct Head Wo Contrast  Result Date: 07/28/2018 CLINICAL DATA:  Follow-up of intracranial hemorrhage EXAM: CT HEAD WITHOUT CONTRAST TECHNIQUE: Contiguous axial images were obtained from the base of the skull through the vertex without intravenous contrast. COMPARISON:  None. FINDINGS: Brain: There is multifocal intraparenchymal hemorrhage redemonstrated throughout the brain. The largest hematomas, in the left frontal lobe and at the  left frontoparietal junction, are unchanged. The right frontal pole hematoma is also unchanged. Subarachnoid blood over the left convexity and in the right sylvian fissure is unchanged. The amount of intraventricular blood is unchanged. There is rightward midline shift that measures 6 mm, unchanged. Vascular: No abnormal hyperdensity of the major intracranial arteries or dural venous sinuses. No intracranial atherosclerosis. Skull: The visualized skull base, calvarium and extracranial soft tissues are normal. Sinuses/Orbits: No fluid levels or advanced mucosal thickening of the visualized paranasal sinuses. No mastoid or middle ear effusion. The orbits are normal. IMPRESSION: Unchanged appearance of multifocal intraparenchymal and extra-axial hemorrhage throughout the brain, with unchanged 6 mm rightward midline shift. Electronically Signed   By: Deatra RobinsonKevin  Herman M.D.   On: 07/28/2018 04:42    PHYSICAL EXAM  Temp:  [99.3 F (37.4 C)-102.6 F (39.2 C)] 101.1 F (38.4 C) (11/25 1200) Pulse Rate:  [81-112] 85 (11/25 1345) Resp:  [18-28] 23 (11/25 1400) BP: (102-147)/(57-88) 119/58 (11/25 1400) SpO2:  [88 %-100 %] 100 % (11/25 1345) FiO2 (%):  [30 %] 30 % (11/25 1146)  General -elderly african american lady , intubated not on sedation.  Ophthalmologic - fundi not visualized due to noncooperation.  Cardiovascular - Regular rhythm, no murmur or gallop  Neuro - intubated not on sedation, eyes closed, not open on voice but slightly open on pain stimulation.  Not following any commands.  Left pupil 4 mm, brisk to light, right pupil 2.5 mm, brisk to light.  Left gaze preference, no doll's eyes, bilateral  corneal weak reflexes.  Gag and cough reflex present. LUE and LLE spontaneous movement noted intermittently.On pain summation LUE withdraw and localizing to pain, RUE trace withdraw.  LLE withdraws to pain   but RLE trace withdraw minimal..  DTR 1+, bilateral Babinski positive. Sensation, coordination and  gait not tested.   ASSESSMENT/PLAN Ms. Adi Seales is a 72 y.o. female with history of dementia presenting with generalized weakness, found down at home, soaked in urine.  She was admitted to [facility] and found to have bilateral PE on chest CT and bilateral DVT and lower extremities.  She was put on heparin IV but then developed worsening mental status for which MRI was done showed left frontal anterior and posterior ICH with small right frontal ICH and IVH.  Patient transferred to [facility] for further evaluation.   ICH: Left anterior and posterior frontal large hematoma, right anterior frontal small hematoma, and IVH bilaterally.  Heparin was reversed with protamine.  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  Resultant unresponsive, intubated  CT head 07/25/2018 no acute abnormality  MRI head showed left anterior and posterior frontal large hematoma, right anterior frontal small hematoma and IVH. However, SWI sequence was not able to obtain due to noncooperation.  CT head repeat 07/26/18 confirmed ICH and IVH as above  CT repeat 08/27/18 stable hematoma and improved MLS  Neurosurgery consulted, Dr. Venetia Maxon recommended against surgical intervention.  2D Echo - EF 65 to 70%  HgbA1c 5.7  VTE prophylaxis - SCDs  No antithrombotic prior to admission, was treated with heparin IV for PE/DVT, now on No antithrombotic given ICH.  Ongoing aggressive stroke risk factor management  Therapy recommendations:  pending  Disposition:  Pending Poor prognosis and son has transferred POA to pt brother Zola Button MD who is neurosurgeon in [place]. Pt another brother Myrtie Neither MD was orthopedics in [place].   Cerebral edema and uncal herniation  CT and MRI showed midline shift 10 mm  anisocoria with L>R, but papillary reflex present bilaterally  CT repeat  stable hematoma but improved MLS  Mannitol 1 dose given on admission  Currently off 3% saline  Pt has PICC line  Sodium 144->153->160->157->158  Sodium goal 150 - 160  Sodium check every 6 hours  On tube feeding   Bilateral PE and DVT  Patient found down at home, soaked in urine  Chest CT showed bilateral upper lobe PE  LE venous Doppler showed bilateral DVT  Was on heparin IV which has been discontinued and reversed due to ICH  Respiratory distress  Intubated for not protecting airway  On ventilation  CCM on board  Sepsis  CT chest showed multifocal pneumonia  UA WBC 21-50  Urine culture showed gram-negative rods  Leukocytosis with WBC 11.1->11.0->8.5  Currently on azithromycin, Rocephin  Overnight Tmax 100.1  Hypertension  Stable on cleviprex . BP goal < 140 . Wean off cleviprex as able . Now on po meds with amlodipine, losartan and metoprolol . Long-term BP goal normotensive  Dysphagia   Did not pass swallow  Has OG tube  Will start TF today  Other Stroke Risk Factors  Advanced age  Other Active Problems  Baseline dementia    Hospital day # 4 Plan IVC filter by IR likely today and then trach/ PEG in next 1-2 days.Family wants transfer to [facility] in [place] later. Continue to hold hypertonic saline  till Na falls below 150. This patient is critically ill due to extensive ICH, IVH, bilateral PE/DVT, cerebral edema and uncal herniation, sepsis with pneumonia and UTI and at significant risk of neurological worsening, death form uncal herniation, cerebral edema, heart failure, respiratory failure, seizure and sepsis as well as brain death. This patient's care requires constant monitoring of vital signs, hemodynamics, respiratory and cardiac monitoring, review of multiple databases, neurological assessment, discussion with family, other specialists and medical decision making of high complexity. I spent 45 minutes of neurocritical care time in  the care of this patient. I had long discussion with her sister in law  at bedside, updated pt current condition, treatment plan and potential prognosis. She expressed understanding and appreciation. I also discussed with Dr. Pixie Casino, MD Stroke Neurology 07/29/2018 2:17 PM     To contact Stroke Continuity provider, please refer to [email or web address]. After hours, contact General Neurology

## 2018-07-29 NOTE — Progress Notes (Signed)
Patient taken to IR. Patient tolerated well. RT will continue to monitor.

## 2018-07-29 NOTE — Sedation Documentation (Signed)
Patient arrived from 4N with RT and floor RN. Patient on ventilator and responsive only to pain.

## 2018-07-29 NOTE — Sedation Documentation (Signed)
Unable to monitor ETCO2 due to patient being intubated

## 2018-07-29 NOTE — Progress Notes (Signed)
..     NAME:  Hannah Holloway, MRN:  [id number], DOB:  06-Jun-1946, LOS: 4 ADMISSION DATE:  07/24/2018, CONSULTATION DATE:  07/25/18 REFERRING MD:  Sunnie NielsenEGALADO, CHIEF COMPLAINT:  Altered mental status   Brief History   [age] yr old female who was admitted on 07/24/18 for mgmt of Acute PE secondary to lower ext thrombosis from immobility started on Heparin gtt for anticoagulation. Experienced acute change in mental status while in MRI where pt was found to have a large left sided IPH and IVH with midline shift 10 mm.   Past Medical History  Vitamin D deficiency  Significant Hospital Events   11/20 Acute PE and b/l lower leg DVTs 11/21 New ICH, ETT  Consults:  Neurology:11/21 Neurosurgery: 11/21  Procedures:  ETT 11/21 >>  PICC 11/23 >>   Significant Diagnostic Tests:  CT angio chest 11/21 >> upper lobe PE, multifocal infiltrates, coronary calcification Dopper legs 11/21 >> acute deep vein thrombosis involving the right gastrocnemius vein, right posterior tibial vein, and right popliteal vein; acute deep vein thrombosis involving the left peroneal vein, left posterior tibial vein, and left popliteal vein.  MRI brain 11/21 >> anterior Lt frontal lobe and posterior Lt frontal and parietal lobe hemorrhage with 10 mm shift, intraventricular hemorrhage w/o hydrocephalus CT chest 11/24 >> midline shift 6 mm   Micro Data:  Blood 11/20 >> negative Urine 11/21 >> E coli  Antimicrobials:  Rocephin 11/21 >>  Azithromycin 11/21 >>   Interim history/subjective:  Remains on full vent support.  Objective   Blood pressure 137/73, pulse 94, temperature 99.3 F (37.4 C), temperature source Axillary, resp. rate (!) 21, height 5\' 6"  (1.676 m), weight 72.6 kg, SpO2 96 %.    Vent Mode: PRVC FiO2 (%):  [30 %] 30 % Set Rate:  [12 bmp] 12 bmp Vt Set:  [470 mL] 470 mL PEEP:  [5 cmH20] 5 cmH20 Plateau Pressure:  [12 cmH20-17 cmH20] 17 cmH20   Intake/Output Summary (Last 24 hours) at 07/29/2018  0817 Last data filed at 07/29/2018 0600 Gross per 24 hour  Intake 1074.47 ml  Output 1050 ml  Net 24.47 ml   Filed Weights   07/24/18 1918  Weight: 72.6 kg    Examination:  General - on vent Eyes - pupils midpoint ENT - ett in place Cardiac - regular rate/rhythm, no murmur Chest - scattered rhonchi Abdomen - soft, non tender, + bowel sounds Extremities - 1+ edema Skin - no rashes Neuro - doesn't follow commands  Assessment & Plan:   Acute Intraparenchymal and Intraventricular Hemorrhage Now w/ cerebral edema and uncal herniation.  Plan - off 3% saline >> trend Na - neurology, neurosurgery following  Acute PE with b/l lower leg DVT. Plan - will ask IR to assess for IVC filter  Acute respiratory failure with compromised airway. Plan - family agreeable to tracheostomy - pressure support wean as able  Hypertension. Plan - cleviprex with goal SBP < 140 - norvasc, cozaar, lopressor  Community acquired pneumonia. E coli UTI. Plan - day 5 of Abx - can d/c zithromax and continue rocephin  Dysphagia. Plan - will need to arrange for PEG placement with IR  Best practice:  Diet: TF DVT prophylaxis: NONE- massive hemorrhage GI prophylaxis: pepcid Glucose control: SSI Mobility: bedrest Code Status: Full Code Family: No family at bedside  Coralyn HellingVineet Tony Friscia, MD [facility]/Critical Care 07/29/2018, 8:28 AM

## 2018-07-30 ENCOUNTER — Inpatient Hospital Stay (HOSPITAL_COMMUNITY): Payer: Medicare Other

## 2018-07-30 ENCOUNTER — Encounter (HOSPITAL_COMMUNITY): Payer: Self-pay | Admitting: Diagnostic Radiology

## 2018-07-30 DIAGNOSIS — J9600 Acute respiratory failure, unspecified whether with hypoxia or hypercapnia: Secondary | ICD-10-CM | POA: Diagnosis present

## 2018-07-30 DIAGNOSIS — J988 Other specified respiratory disorders: Secondary | ICD-10-CM

## 2018-07-30 DIAGNOSIS — Z930 Tracheostomy status: Secondary | ICD-10-CM

## 2018-07-30 HISTORY — PX: IR GASTROSTOMY TUBE MOD SED: IMG625

## 2018-07-30 LAB — GLUCOSE, CAPILLARY
GLUCOSE-CAPILLARY: 100 mg/dL — AB (ref 70–99)
GLUCOSE-CAPILLARY: 114 mg/dL — AB (ref 70–99)
Glucose-Capillary: 56 mg/dL — ABNORMAL LOW (ref 70–99)
Glucose-Capillary: 61 mg/dL — ABNORMAL LOW (ref 70–99)
Glucose-Capillary: 104 mg/dL — ABNORMAL HIGH (ref 70–99)
Glucose-Capillary: 119 mg/dL — ABNORMAL HIGH (ref 70–99)
Glucose-Capillary: 99 mg/dL (ref 70–99)

## 2018-07-30 LAB — BASIC METABOLIC PANEL
Anion gap: 4 — ABNORMAL LOW (ref 5–15)
BUN: 17 mg/dL (ref 8–23)
CHLORIDE: 120 mmol/L — AB (ref 98–111)
CO2: 30 mmol/L (ref 22–32)
CREATININE: 0.53 mg/dL (ref 0.44–1.00)
Calcium: 9 mg/dL (ref 8.9–10.3)
GFR calc Af Amer: >60 mL/min (ref >60)
GFR calc non Af Amer: >60 mL/min (ref >60)
GLUCOSE: 181 mg/dL — AB (ref 70–99)
POTASSIUM: 3.1 mmol/L — AB (ref 3.5–5.1)
SODIUM: 154 mmol/L — AB (ref 135–145)

## 2018-07-30 LAB — CBC
HEMATOCRIT: 35.7 % — AB (ref 36.0–46.0)
Hemoglobin: 10.5 g/dL — ABNORMAL LOW (ref 12.0–15.0)
MCH: 28.9 pg (ref 26.0–34.0)
MCHC: 29.4 g/dL — AB (ref 30.0–36.0)
MCV: 98.3 fL (ref 80.0–100.0)
Platelets: 128 10*3/uL — ABNORMAL LOW (ref 150–400)
RBC: 3.63 MIL/uL — AB (ref 3.87–5.11)
RDW: 15.9 % — ABNORMAL HIGH (ref 11.5–15.5)
WBC: 9.9 10*3/uL (ref 4.0–10.5)
nRBC: 0 % (ref 0.0–0.2)

## 2018-07-30 LAB — SODIUM
SODIUM: 153 mmol/L — AB (ref 135–145)
SODIUM: 154 mmol/L — AB (ref 135–145)
SODIUM: 156 mmol/L — AB (ref 135–145)

## 2018-07-30 MED ORDER — GLUCAGON HCL (RDNA) 1 MG IJ SOLR
INTRAMUSCULAR | Status: AC | PRN
  Administered 2018-07-30: 1 mg via INTRAVENOUS

## 2018-07-30 MED ORDER — POTASSIUM CHLORIDE 20 MEQ/15ML (10%) PO SOLN
40.0000 meq | Freq: Two times a day (BID) | ORAL | Status: DC
  Administered 2018-07-30 – 2018-08-03 (×10): 40 meq via ORAL
  Filled 2018-07-30 (×10): qty 30

## 2018-07-30 MED ORDER — CEFAZOLIN SODIUM-DEXTROSE 2-4 GM/100ML-% IV SOLN
2.0000 g | Freq: Once | INTRAVENOUS | Status: AC
  Administered 2018-07-30: 2 g via INTRAVENOUS

## 2018-07-30 MED ORDER — IOPAMIDOL (ISOVUE-300) INJECTION 61%
INTRAVENOUS | Status: AC
  Administered 2018-07-30: 15 mL
  Filled 2018-07-30: qty 50

## 2018-07-30 MED ORDER — LABETALOL HCL 5 MG/ML IV SOLN
10.0000 mg | INTRAVENOUS | Status: DC | PRN
  Administered 2018-07-30: 20 mg via INTRAVENOUS
  Administered 2018-08-01 (×2): 40 mg via INTRAVENOUS
  Filled 2018-07-30 (×2): qty 8

## 2018-07-30 MED ORDER — LIDOCAINE HCL 1 % IJ SOLN
INTRAMUSCULAR | Status: AC | PRN
  Administered 2018-07-30: 10 mL

## 2018-07-30 MED ORDER — GLUCAGON HCL RDNA (DIAGNOSTIC) 1 MG IJ SOLR
INTRAMUSCULAR | Status: AC
  Filled 2018-07-30: qty 1

## 2018-07-30 MED ORDER — FENTANYL CITRATE (PF) 100 MCG/2ML IJ SOLN
100.0000 ug | Freq: Once | INTRAMUSCULAR | Status: AC
  Administered 2018-07-30: 100 ug via INTRAVENOUS

## 2018-07-30 MED ORDER — CEFAZOLIN SODIUM-DEXTROSE 2-4 GM/100ML-% IV SOLN
INTRAVENOUS | Status: AC
  Filled 2018-07-30: qty 100

## 2018-07-30 MED ORDER — MIDAZOLAM HCL 2 MG/2ML IJ SOLN
INTRAMUSCULAR | Status: AC
  Filled 2018-07-30: qty 2

## 2018-07-30 MED ORDER — DEXTROSE 10 % IV SOLN: INTRAVENOUS | Status: DC

## 2018-07-30 MED ORDER — LIDOCAINE HCL 1 % IJ SOLN
INTRAMUSCULAR | Status: AC
  Filled 2018-07-30: qty 20

## 2018-07-30 MED ORDER — SODIUM CHLORIDE 0.9 % IV SOLN
INTRAVENOUS | Status: DC
  Administered 2018-07-30 – 2018-08-04 (×5): via INTRAVENOUS

## 2018-07-30 MED ORDER — ETOMIDATE 2 MG/ML IV SOLN
20.0000 mg | Freq: Once | INTRAVENOUS | Status: AC
  Administered 2018-07-30: 20 mg via INTRAVENOUS

## 2018-07-30 MED ORDER — CHLORHEXIDINE GLUCONATE 0.12 % MT SOLN
15.0000 mL | Freq: Two times a day (BID) | OROMUCOSAL | Status: DC
  Administered 2018-07-30 – 2018-07-31 (×2): 15 mL via OROMUCOSAL
  Filled 2018-07-30: qty 15

## 2018-07-30 MED ORDER — ORAL CARE MOUTH RINSE
15.0000 mL | Freq: Two times a day (BID) | OROMUCOSAL | Status: DC
  Administered 2018-07-31 (×2): 15 mL via OROMUCOSAL

## 2018-07-30 MED ORDER — VECURONIUM BROMIDE 10 MG IV SOLR: 10.0000 mg | Freq: Once | INTRAVENOUS | Status: DC

## 2018-07-30 MED ORDER — MIDAZOLAM HCL 2 MG/2ML IJ SOLN
2.0000 mg | Freq: Once | INTRAMUSCULAR | Status: AC
  Administered 2018-07-30: 2 mg via INTRAVENOUS

## 2018-07-30 MED ORDER — DEXTROSE 50 % IV SOLN
25.0000 mL | Freq: Once | INTRAVENOUS | Status: AC
  Administered 2018-07-30: 25 mL via INTRAVENOUS
  Filled 2018-07-30: qty 50

## 2018-07-30 NOTE — Progress Notes (Signed)
Per Dr. Pearlean BrownieSethi, new SBP goal < 160

## 2018-07-30 NOTE — Procedures (Signed)
Bronchoscopy Procedure Note Jene EveryLucille Buenger [id number] 1945/11/18  Procedure: Bronchoscopy Indications: bronchoscopy for perc trach placement   Procedure Details Consent: Risks of procedure as well as the alternatives and risks of each were explained to the (patient/caregiver).  Consent for procedure obtained. Time Out: Verified patient identification, verified procedure, site/side was marked, verified correct patient position, special equipment/implants available, medications/allergies/relevent history reviewed, required imaging and test results available.  Performed  In preparation for procedure, patient was given 100% FiO2. Sedation: Benzodiazepines, Etomidate and fent and vec  Procedure done by P Bentzion Dauria ACNP-BC, under direct supervision of Dr Molli KnockYacoub. At first bronch was introduce through ET tube and structures of tracheal rings, carina identified for operator of tracheostomy who was Dr Sarita BottomYavcoub. Light of bronch passed through trachea and skin for indentification of tracheal rings for tracheostomy puncture. After this, under bronchoscopy guidance,  ET tube was pulled back sufficiently and very carefully. The ET tube was  pulled back enough to give room for tracheostomy operator and yet at same time to to ensure a secured airway. After this was accomplished, bronchoscope was withdrawn into the ET tube. After this,  Dr Molli KnockYacoub  then performed tracheostomy under video visual provided by flexible video bronchoscopy. Followng introduction of tracheostomy,  the bronchoscope was removed from ET tube and introduced through tracheostomy. Correct position of tracheostomy was ensured, with enough room between carina and distal tracheostomy and no evidence of bleeding. The bronchoscope was then withdrawn. Respiratory therapist was then instructed to remove the ET tube.  Dr Molli KnockYacoub  then proceeded to complete the tracheostomy with stay sutures   No complications  Evaluation Hemodynamic Status: BP stable  throughout; O2 sats: stable throughout Patient's Current Condition: stable Specimens:  None Complications: No apparent complications Patient did tolerate procedure well.   Shelby Mattocksete E Shaquita Fort 07/30/2018  Simonne MartinetPeter E Kilan Banfill ACNP-BC [facility]/Critical [facility] Pager # [phone] OR # [phone] if no answer

## 2018-07-30 NOTE — Progress Notes (Signed)
eLink Physician-Brief Progress Note Patient Name: Hannah Holloway DOB: 1946/06/13 MRN: [id number]   Date of Service  07/30/2018  HPI/Events of Note  Hypoglycemia - Blood glucose = 56.   eICU Interventions  Will order: 1. D50 1 ample IV now.  2. D10W to run IV at 50 mL/hour.     Intervention Category Major Interventions: Other:  Lenell AntuSommer,Steven Eugene 07/30/2018, 4:17 AM

## 2018-07-30 NOTE — Procedures (Signed)
Bedside Tracheostomy Insertion Procedure Note   Patient Details:   Name: Hannah Holloway DOB: July 13, 1946 MRN: [id number]  Procedure: Tracheostomy  Pre Procedure Assessment: ET Tube Size:7.0 ET Tube secured at lip (cm):24cm mark Bite block in place: No Breath Sounds: Clear  Post Procedure Assessment: BP 140/76 (BP Location: Left Arm)   Pulse 81   Temp 98.6 F (37 C) (Axillary)   Resp (!) 22   Ht 5\' 6"  (1.676 m)   Wt 78.7 kg   SpO2 100%   BMI 28.00 kg/m  O2 sats: stable throughout Complications: No apparent complications Patient did tolerate procedure well Tracheostomy Brand:Shiley Tracheostomy Style:Cuffed Tracheostomy Size: 6.0 Tracheostomy Secured JXB:[id number]:Sutures and velcro Tracheostomy Placement Confirmation:Trach cuff visualized and in place and CXR ordered    Kessa Fairbairn A Bell-Byess 07/30/2018, 4:05 PM

## 2018-07-30 NOTE — Progress Notes (Signed)
Pt has been medicated with tylenol 650 mg for temperature. Reassess pt temperature and is 98.9 axillary. Will continue to monitor pt.

## 2018-07-30 NOTE — Progress Notes (Addendum)
..   NAME:  Hannah Holloway, MRN:  [id number], DOB:  1946-09-01, LOS: 5 ADMISSION DATE:  07/24/2018, CONSULTATION DATE:  07/25/18 REFERRING MD:  Sunnie Nielsen, CHIEF COMPLAINT:  Altered mental status   Brief History   72 yr old female who was admitted on 07/24/18 for mgmt of Acute PE secondary to lower ext thrombosis from immobility started on Heparin gtt for anticoagulation. Experienced acute change in mental status while in MRI where pt was found to have a large left sided IPH and IVH with midline shift 10 mm.   Past Medical History  Vitamin D deficiency  Significant Hospital Events   11/20 Acute PE and b/l lower leg DVTs 11/21 New ICH, ETT  Consults:  Neurology:11/21 Neurosurgery: 11/21  Procedures:  ETT 11/21 >>  PICC 11/23 >>   Significant Diagnostic Tests:  CT angio chest 11/21 >> upper lobe PE, multifocal infiltrates, coronary calcification Dopper legs 11/21 >> acute deep vein thrombosis involving the right gastrocnemius vein, right posterior tibial vein, and right popliteal vein; acute deep vein thrombosis involving the left peroneal vein, left posterior tibial vein, and left popliteal vein.  MRI brain 11/21 >> anterior Lt frontal lobe and posterior Lt frontal and parietal lobe hemorrhage with 10 mm shift, intraventricular hemorrhage w/o hydrocephalus CT chest 11/24 >> midline shift 6 mm   Micro Data:  Blood 11/20 >> negative Urine 11/21 >> E coli  Antimicrobials:  Rocephin 11/21 >>  Azithromycin 11/21 >>   Interim history/subjective:  Remains on full vent support. Hypokalemia Urinary retention For trach and PEG 11/26  Objective   Blood pressure (!) 141/79, pulse 89, temperature 98.6 F (37 C), temperature source Axillary, resp. rate 18, height 5\' 6"  (1.676 m), weight 78.7 kg, SpO2 100 %.    Vent Mode: PRVC FiO2 (%):  [30 %-100 %] 40 % Set Rate:  [12 bmp] 12 bmp Vt Set:  [470 mL] 470 mL PEEP:  [5 cmH20] 5 cmH20 Pressure Support:  [10 cmH20] 10 cmH20 Plateau  Pressure:  [11 cmH20-14 cmH20] 14 cmH20   Intake/Output Summary (Last 24 hours) at 07/30/2018 0846 Last data filed at 07/30/2018 0800 Gross per 24 hour  Intake 859.07 ml  Output 2300 ml  Net -1440.93 ml   Filed Weights   07/24/18 1918 07/30/18 0500  Weight: 72.6 kg 78.7 kg    Examination:  General - sedated and intubated, full vent support Eyes - pupils midpoint ENT - ett secure and  in place. OG tube Cardiac - S1, S2, regular rate/rhythm, no murmur, rub, gallop Chest - scattered rhonchi, diminished per bases Abdomen - soft, non tender, + bowel sounds, non-distended Extremities - 1+ edema, no obvious deformities Skin - no rashes, no lesions, warm, dry and intact Neuro - doesn't follow commands, opens eyes to stimulation, but no focus or track  Assessment & Plan:   Acute Intraparenchymal and Intraventricular Hemorrhage Now w/ cerebral edema and uncal herniation.  Plan - off 3% saline >> trend Na - neurology, neurosurgery following  Acute PE with b/l lower leg DVT. Plan - IVC filter placed per IR 11/25  Acute respiratory failure with compromised airway. Plan - family agreeable to tracheostomy - Scheduled for 3 pm 11/26 - pressure support wean as able  Hypertension. Plan - cleviprex with goal SBP < 140 - norvasc, cozaar, lopressor  Community acquired pneumonia. E coli UTI. Plan - day 6 of Abx -  d/c zithromax 11/25 and continue rocephin - CXR prn - Trend WBC, fever curve  Dysphagia. Plan -  PEG placement scheduled for 11/26 - TF per dietitian recs  Hypoglycemia Plan: - obtain blood from PICC as finger sticks are inaccurate - D 50 as needed   Hypokalemia Plan: - Replete electrolytes as needed - Trend BMET  Urinary retention Plan: - Place foley - Consider adding Flomax after pharmacy reviews for drug interactions  Best practice:  Diet: TF DVT prophylaxis: NONE- massive hemorrhage GI prophylaxis: pepcid Glucose control: SSI Mobility:  bedrest Code Status: Full Code Family: No family at bedside 11/26  Bevelyn NgoSarah F. Pookela Sellin. AGACNP-BC  Pulmonary/Critical Care 07/30/2018, 8:46 AM

## 2018-07-30 NOTE — Procedures (Signed)
Successful placement of 20 Fr gastrostomy tube.  Minimal blood loss and no immediate complication.  See full report in Imaging.

## 2018-07-30 NOTE — Progress Notes (Signed)
STROKE TEAM PROGRESS NOTE   SUBJECTIVE (INTERVAL HISTORY) Her sister in law  is at the bedside.  Patient has just returned from PEG tube placement by IR today and she had IVC filter placed y`day and trach is planned for this afternoon. Off 3% saline   Na 153. Exam unchanged. Still not following commands  .     OBJECTIVE Vitals:   07/30/18 1300 07/30/18 1315 07/30/18 1330 07/30/18 1400  BP: 133/86 138/88 139/87 139/73  Pulse: 79 82 80 83  Resp: 15 17 15 13   Temp:      TempSrc:      SpO2: 100% 99% 99% 100%  Weight:      Height:        CBC:  Recent Labs  Lab 07/24/18 1950  07/29/18 0557 07/30/18 0616  WBC 10.1   < > 14.0* 9.9  NEUTROABS 8.7*  --   --   --   HGB 17.4*   < > 11.2* 10.5*  HCT 54.5*   < > 37.7 35.7*  MCV 92.7   < > 97.9 98.3  PLT 239   < > 133* 128*   < > = values in this interval not displayed.    Basic Metabolic Panel:  Recent Labs  Lab 07/29/18 0557  07/29/18 1738  07/30/18 0616 07/30/18 1211  NA 157*   < > 157*   < > 154* 153*  K 3.5  --   --   --  3.1*  --   CL 122*  --   --   --  120*  --   CO2 29  --   --   --  30  --   GLUCOSE 186*  --   --   --  181*  --   BUN 20  --   --   --  17  --   CREATININE 0.65  --   --   --  0.53  --   CALCIUM 9.5  --   --   --  9.0  --   MG 2.3  --  2.3  --   --   --   PHOS 4.1  --  3.8  --   --   --    < > = values in this interval not displayed.    Lipid Panel:     Component Value Date/Time   TRIG 16 07/26/2018 1331   HgbA1c:  Lab Results  Component Value Date   HGBA1C 5.7 (H) 07/25/2018   Urine Drug Screen: No results found for: LABOPIA, COCAINSCRNUR, LABBENZ, AMPHETMU, THCU, LABBARB  Alcohol Level No results found for: ETH  IMAGING   Ct Head Wo Contrast  Result Date: 07/26/2018 CLINICAL DATA:  72 y/o  F; acute hemorrhage for follow-up. EXAM: CT HEAD WITHOUT CONTRAST TECHNIQUE: Contiguous axial images were obtained from the base of the skull through the vertex without intravenous contrast.  COMPARISON:  07/25/2018 CT head.  07/25/2018 MRI head. FINDINGS: Brain: Stable left frontal hemorrhage measuring 4.5 x 3.9 x 3.9 cm (AP x ML x CC series 3, image 14 and series 5, image 16). Stable right anterior frontal hematoma measuring 21 mm (series 3, image 15). Stable left frontoparietal hemorrhage measuring 3.4 x 5.0 x 3.4 cm (AP x ML x CC series 5, image 45 and series 3, image 17). Stable 11 mm hemorrhage within the left lateral frontal lobe (series 3, image 17). Stable intraventricular hemorrhage pooling in the occipital horns of the lateral ventricles.  Stable subarachnoid hemorrhage predominantly in the left greater than right sylvian fissures and along the right anterior frontal lobe. Stable thin subdural hematoma along the left tentorium cerebelli. Stable 10 mm left-to-right midline shift of the anterior septum pellucidum. No new stroke, hemorrhage, focal mass effect, or herniation. Vascular: Calcific atherosclerosis of the carotid siphons. No hyperdense vessel identified. Skull: Normal. Negative for fracture or focal lesion. Sinuses/Orbits: No acute finding. Other: None. IMPRESSION: In comparison with the prior MRI of the head given differences in technique: 1. Stable acute hemorrhages within the left frontal lobe, right frontal lobe, left frontal parietal junction, and left lateral frontal lobe. 2. Stable intraventricular hemorrhage, thin subdural hematoma along the left tentorium, and small volume subarachnoid hemorrhage. 3. Stable 10 mm left-to-right midline shift of anterior septum pellucidum. 4. No new acute intracranial process identified. Electronically Signed   By: Mitzi Hansen M.D.   On: 07/26/2018 04:54   Mr Brain Wo Contrast  Result Date: 07/25/2018 CLINICAL DATA:  Acute mental status changes. Pulmonary embolus and DVT. EXAM: MRI HEAD WITHOUT CONTRAST TECHNIQUE: Multiplanar, multiecho pulse sequences of the brain and surrounding structures were obtained without intravenous  contrast. COMPARISON:  CT head without contrast 07/25/2018 12:25 a.m. FINDINGS: Brain: Multifocal areas of acute hemorrhage are present. Anterior left frontal lobe hemorrhage measures 4.0 x 4.5 x 4.3 Cm. A new hemorrhage in the posterior left frontal and left parietal lobe measures 5.5 x 3.0 x 3.5 cm. A smaller area of hemorrhage in the anterior inferior right frontal lobe measures 2.4 cm. Left right midline shift is 10 mm. There is effacement of the left lateral ventricle. Blood products are seen within the ventricles bilaterally. No hydrocephalus is evident. Posterior fossa is unremarkable. No acute parenchymal infarct is present otherwise. Vascular: Flow is present in the major intracranial arteries. Skull and upper cervical spine: Craniocervical junction is normal. Upper cervical spine is normal. Sinuses/Orbits: The left sphenoid sinus is partially opacified. No other significant paranasal sinus disease is present. Mastoid air cells are clear. Globes and orbits are within normal limits. IMPRESSION: 1. New large areas of hemorrhage in the anterior left frontal lobe and posterior left frontal and parietal lobe resulting in significant mass effect with left-to-right midline shift up to 10 mm. 2. Smaller area of hemorrhage in the anterior inferior right frontal lobe. 3. Intraventricular hemorrhage with fluid levels in the posterior horns of both lateral ventricles without hydrocephalus. Critical Value/emergent results were called by telephone at the time of interpretation on 07/25/2018 at 6:39 pm to Dr. Hartley Barefoot , who verbally acknowledged these results. Electronically Signed   By: Marin Roberts M.D.   On: 07/25/2018 19:01   Dg Chest Port 1 View  Result Date: 07/25/2018 CLINICAL DATA:  Status post intubation EXAM: PORTABLE CHEST 1 VIEW COMPARISON:  CT 07/25/2018 FINDINGS: Endotracheal tube with tip 4 cm from carina. Normal cardiac silhouette. Aorta is ectatic. Lungs are clear. IMPRESSION:  Endotracheal tube in good position. Electronically Signed   By: Genevive Bi M.D.   On: 07/25/2018 20:49   Vas [place] Lower Extremity Venous (dvt)  Result Date: 07/25/2018  Lower Venous Study Indications: SOB, and pulmonary embolism.  Risk Factors: Confirmed PE. Performing Technologist: Toma Deiters RVS  Examination Guidelines: A complete evaluation includes B-mode imaging, spectral Doppler, color Doppler, and power Doppler as needed of all accessible portions of each vessel. Bilateral testing is considered an integral part of a complete examination. Limited examinations for reoccurring indications may be performed as noted.  Right Venous Findings: +---------+---------------+---------+-----------+----------+------------------+  CompressibilityPhasicitySpontaneityPropertiesSummary            +---------+---------------+---------+-----------+----------+------------------+ CFV      Full           Yes      Yes                                     +---------+---------------+---------+-----------+----------+------------------+ SFJ      Full                                                            +---------+---------------+---------+-----------+----------+------------------+ FV Prox  Full           Yes      Yes                                     +---------+---------------+---------+-----------+----------+------------------+ FV Mid   Full                                                            +---------+---------------+---------+-----------+----------+------------------+ FV DistalFull           Yes      Yes                                     +---------+---------------+---------+-----------+----------+------------------+ PFV      Full           Yes      Yes                                     +---------+---------------+---------+-----------+----------+------------------+ POP      Partial        Yes      Yes                                      +---------+---------------+---------+-----------+----------+------------------+ PTV      Partial                                      Acute mid to                                                             proximal           +---------+---------------+---------+-----------+----------+------------------+ PERO  Difficult to                                                             visualize          +---------+---------------+---------+-----------+----------+------------------+ Gastroc  Partial                                      Acute              +---------+---------------+---------+-----------+----------+------------------+  Right Technical Findings: Difficult to position the leg for distal imaging due to dementia and patient poor to repond.  Left Venous Findings: +---------+---------------+---------+-----------+----------+-------------+          CompressibilityPhasicitySpontaneityPropertiesSummary       +---------+---------------+---------+-----------+----------+-------------+ CFV      Full           Yes      Yes                                +---------+---------------+---------+-----------+----------+-------------+ SFJ      Full                                                       +---------+---------------+---------+-----------+----------+-------------+ FV Prox  Full           Yes      Yes                                +---------+---------------+---------+-----------+----------+-------------+ FV Mid   Full                                                       +---------+---------------+---------+-----------+----------+-------------+ FV DistalFull           Yes      Yes                                +---------+---------------+---------+-----------+----------+-------------+ PFV      Full           Yes      Yes                                 +---------+---------------+---------+-----------+----------+-------------+ POP      Partial                                      Mid to distal +---------+---------------+---------+-----------+----------+-------------+ PTV      None                                                       +---------+---------------+---------+-----------+----------+-------------+  PERO     None                                                       +---------+---------------+---------+-----------+----------+-------------+  Left Technical Findings: Difficult to positin the lef for distal imaging due to dementia and patient poorto respond. Rouleux flow was noted in the proxiaml popliteal vein.   Summary: Right: Findings consistent with acute deep vein thrombosis involving the right gastrocnemius vein, right posterior tibial vein, and right popliteal vein. See technical findings listed above Left: Findings consistent with acute deep vein thrombosis involving the left peroneal vein, left posterior tibial vein, and left popliteal vein. See technical findings listed above  *See table(s) above for measurements and observations. Electronically signed by Sherald Hess MD on 07/25/2018 at 3:36:49 PM.    Final     Transthoracic Echocardiogram  Normal LV size with EF 65-70%. Normal RV size and systolic   function. Flattened mitral valve closure plane without frank   prolapse, mild to moderate mitral regurgitation. Mild pulmonary   hypertension.  Ct Head Wo Contrast  Result Date: 07/28/2018 CLINICAL DATA:  Follow-up of intracranial hemorrhage EXAM: CT HEAD WITHOUT CONTRAST TECHNIQUE: Contiguous axial images were obtained from the base of the skull through the vertex without intravenous contrast. COMPARISON:  None. FINDINGS: Brain: There is multifocal intraparenchymal hemorrhage redemonstrated throughout the brain. The largest hematomas, in the left frontal lobe and at the left frontoparietal junction, are unchanged.  The right frontal pole hematoma is also unchanged. Subarachnoid blood over the left convexity and in the right sylvian fissure is unchanged. The amount of intraventricular blood is unchanged. There is rightward midline shift that measures 6 mm, unchanged. Vascular: No abnormal hyperdensity of the major intracranial arteries or dural venous sinuses. No intracranial atherosclerosis. Skull: The visualized skull base, calvarium and extracranial soft tissues are normal. Sinuses/Orbits: No fluid levels or advanced mucosal thickening of the visualized paranasal sinuses. No mastoid or middle ear effusion. The orbits are normal. IMPRESSION: Unchanged appearance of multifocal intraparenchymal and extra-axial hemorrhage throughout the brain, with unchanged 6 mm rightward midline shift. Electronically Signed   By: Deatra Robinson M.D.   On: 07/28/2018 04:42    PHYSICAL EXAM  Temp:  [98.4 F (36.9 C)-100.5 F (38.1 C)] 98.8 F (37.1 C) (11/26 1200) Pulse Rate:  [79-108] 83 (11/26 1400) Resp:  [12-26] 13 (11/26 1400) BP: (110-161)/(39-116) 139/73 (11/26 1400) SpO2:  [91 %-100 %] 100 % (11/26 1400) FiO2 (%):  [40 %-100 %] 40 % (11/26 1200) Weight:  [78.7 kg] 78.7 kg (11/26 0500)  General -elderly african american lady , intubated not on sedation.  Ophthalmologic - fundi not visualized due to noncooperation.  Cardiovascular - Regular rhythm, no murmur or gallop  Neuro - intubated not on sedation, eyes closed, not open on voice but slightly open on pain stimulation.  Not following any commands.  Left pupil 4 mm, brisk to light, right pupil 2.5 mm, brisk to light.  Left gaze preference, no doll's eyes, bilateral corneal weak reflexes.  Gag and cough reflex present. LUE and LLE spontaneous movement noted intermittently.On pain summation LUE withdraw and localizing to pain, RUE trace withdraw.  LLE withdraws to pain   but RLE trace withdraw minimal..  DTR 1+, bilateral Babinski positive. Sensation, coordination and  gait not tested.   ASSESSMENT/PLAN Ms. Chrisa  Caldron is a 72 y.o. female with history of dementia presenting with generalized weakness, found down at home, soaked in urine.  She was admitted to [facility] and found to have bilateral PE on chest CT and bilateral DVT and lower extremities.  She was put on heparin IV but then developed worsening mental status for which MRI was done showed left frontal anterior and posterior ICH with small right frontal ICH and IVH.  Patient transferred to [facility] for further evaluation.   ICH: Left anterior and posterior frontal large hematoma, right anterior frontal small hematoma, and IVH bilaterally.  Heparin was reversed with protamine.  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  Resultant unresponsive, intubated  CT head 07/25/2018 no acute abnormality  MRI head showed left anterior and posterior frontal large hematoma, right anterior frontal small hematoma and IVH. However, SWI sequence was not able to obtain due to noncooperation.  CT head repeat 07/26/18 confirmed ICH and IVH as above  CT repeat 08/27/18 stable hematoma and improved MLS  Neurosurgery consulted, Dr. Venetia Maxon recommended against surgical intervention.  2D Echo - EF 65 to 70%  HgbA1c 5.7  VTE prophylaxis - SCDs  No antithrombotic prior to admission, was treated with heparin IV for PE/DVT, now on No antithrombotic given ICH.  Ongoing aggressive stroke risk factor management  Therapy recommendations:  pending  Disposition:  Pending Poor prognosis and son has transferred POA to pt brother Zola Button MD who is neurosurgeon in [place]. Pt another brother Myrtie Neither MD was orthopedics in [place].   Cerebral edema and uncal herniation  CT and MRI showed midline shift 10 mm  anisocoria with L>R, but papillary reflex present bilaterally  CT repeat  stable hematoma but improved MLS  Mannitol 1 dose given on admission  Currently off 3% saline  Pt has PICC line  Sodium 144->153->160->157->158  Sodium goal 150 - 160  Sodium check every 6 hours  On tube feeding   Bilateral PE and DVT  Patient found down at home, soaked in urine  Chest CT showed bilateral upper lobe PE  LE venous Doppler showed bilateral DVT  Was on heparin IV which has been discontinued and reversed due to ICH  Respiratory distress  Intubated for not protecting airway  On ventilation  CCM on board  Sepsis  CT chest showed multifocal pneumonia  UA WBC 21-50  Urine culture showed gram-negative rods  Leukocytosis with WBC 11.1->11.0->8.5  Currently on azithromycin, Rocephin  Overnight Tmax 100.1  Hypertension  Stable on cleviprex . BP goal < 140 . Wean off cleviprex as able . Now on po meds with amlodipine, losartan and metoprolol . Long-term BP goal normotensive  Dysphagia   Did not pass swallow  Has OG tube  Will start TF today  Other Stroke Risk Factors  Advanced age  Other Active Problems  Baseline dementia    Hospital day # 5 Plan   Trach this afternoon. Start using peg.Family wants transfer to [facility] in [place] later. Continue to hold hypertonic saline till Na falls below 150. This patient is critically ill due to extensive ICH, IVH, bilateral PE/DVT, cerebral edema and uncal herniation, sepsis with pneumonia and UTI and at significant risk of neurological worsening, death form uncal herniation, cerebral edema, heart failure, respiratory failure, seizure and sepsis as well as brain death. This patient's care requires constant monitoring of vital signs, hemodynamics, respiratory  and cardiac monitoring, review of multiple databases, neurological assessment, discussion with family, other specialists and medical decision making of high complexity. I spent 45 minutes of neurocritical care time in the care of this patient. I  had long discussion with her sister in law  at bedside, updated pt current condition, treatment plan and potential prognosis. She expressed understanding and appreciation. I also discussed with Dr. Pixie Casino, MD Stroke Neurology 07/30/2018 2:34 PM     To contact Stroke Continuity provider, please refer to [email or web address]. After hours, contact General Neurology

## 2018-07-30 NOTE — Progress Notes (Signed)
Neuro MD paged. Does not want to start D10 infusion. Will give D50 injection and have day team address in rounds.

## 2018-07-30 NOTE — Procedures (Signed)
Percutaneous Tracheostomy Placement  Consent from family.  Patient sedated, paralyzed and position.  Placed on 100% FiO2 and RR matched.  Area cleaned and draped.  Lidocaine/epi injected.  Skin incision done followed by blunt dissection.  Trachea palpated then punctured, catheter passed and visualized bronchoscopically.  Wire placed and visualized.  Catheter removed.  Airway then entered and dilated.  Size 6 cuffed shiley trach placed and visualized bronchoscopically well above carina.  Good volume returns.  Patient tolerated the procedure well without complications.  Minimal blood loss.  CXR ordered and pending.  Wesam G. Yacoub, M.D. [facility]. Pager: [phone]. After hours pager: [phone].  

## 2018-07-30 NOTE — Progress Notes (Signed)
CBG at 0804 was 36, redraw from PICC line at 0810 was 104. Will d/c fingersticks and obtain POCT glucose readings from central line from now on.

## 2018-07-31 ENCOUNTER — Inpatient Hospital Stay (HOSPITAL_COMMUNITY): Payer: Medicare Other

## 2018-07-31 LAB — CBC
HEMATOCRIT: 33.6 % — AB (ref 36.0–46.0)
Hemoglobin: 10.2 g/dL — ABNORMAL LOW (ref 12.0–15.0)
MCH: 29.7 pg (ref 26.0–34.0)
MCHC: 30.4 g/dL (ref 30.0–36.0)
MCV: 98 fL (ref 80.0–100.0)
NRBC: 0 % (ref 0.0–0.2)
Platelets: 139 10*3/uL — ABNORMAL LOW (ref 150–400)
RBC: 3.43 MIL/uL — ABNORMAL LOW (ref 3.87–5.11)
RDW: 15.5 % (ref 11.5–15.5)
WBC: 7.6 10*3/uL (ref 4.0–10.5)

## 2018-07-31 LAB — SODIUM
SODIUM: 152 mmol/L — AB (ref 135–145)
Sodium: 152 mmol/L — ABNORMAL HIGH (ref 135–145)
Sodium: 152 mmol/L — ABNORMAL HIGH (ref 135–145)

## 2018-07-31 LAB — BASIC METABOLIC PANEL
Anion gap: 5 (ref 5–15)
BUN: 15 mg/dL (ref 8–23)
CALCIUM: 9 mg/dL (ref 8.9–10.3)
CO2: 27 mmol/L (ref 22–32)
Chloride: 119 mmol/L — ABNORMAL HIGH (ref 98–111)
Creatinine, Ser: 0.55 mg/dL (ref 0.44–1.00)
GFR calc Af Amer: >60 mL/min (ref >60)
GFR calc non Af Amer: >60 mL/min (ref >60)
GLUCOSE: 111 mg/dL — AB (ref 70–99)
Potassium: 3.8 mmol/L (ref 3.5–5.1)
Sodium: 151 mmol/L — ABNORMAL HIGH (ref 135–145)

## 2018-07-31 LAB — GLUCOSE, CAPILLARY
GLUCOSE-CAPILLARY: 36 mg/dL — AB (ref 70–99)
GLUCOSE-CAPILLARY: 112 mg/dL — AB (ref 70–99)
GLUCOSE-CAPILLARY: 123 mg/dL — AB (ref 70–99)
GLUCOSE-CAPILLARY: 135 mg/dL — AB (ref 70–99)
Glucose-Capillary: 100 mg/dL — ABNORMAL HIGH (ref 70–99)
Glucose-Capillary: 85 mg/dL (ref 70–99)
Glucose-Capillary: 96 mg/dL (ref 70–99)

## 2018-07-31 LAB — OCCULT BLOOD X 1 CARD TO LAB, STOOL: FECAL OCCULT BLD: POSITIVE — AB

## 2018-07-31 LAB — PHOSPHORUS: Phosphorus: 3 mg/dL (ref 2.5–4.6)

## 2018-07-31 LAB — MAGNESIUM: Magnesium: 2.4 mg/dL (ref 1.7–2.4)

## 2018-07-31 MED ORDER — CHLORHEXIDINE GLUCONATE 0.12% ORAL RINSE (MEDLINE KIT)
15.0000 mL | Freq: Two times a day (BID) | OROMUCOSAL | Status: DC
  Administered 2018-07-31 – 2018-08-09 (×18): 15 mL via OROMUCOSAL

## 2018-07-31 MED ORDER — ORAL CARE MOUTH RINSE
15.0000 mL | OROMUCOSAL | Status: DC
  Administered 2018-08-01 – 2018-08-04 (×16): 15 mL via OROMUCOSAL

## 2018-07-31 NOTE — Progress Notes (Signed)
Referring Physician(s): Sood,V  Supervising Physician: Richarda Overlie  Patient Status:  [facility] - In-pt  Chief Complaint:  Dysphagia,DVT/PE  Subjective: Pt on vent; s/p trach/bronch/G tube yesterday; occ opens eyes, not FC   Allergies: Patient has no known allergies.  Medications: Prior to Admission medications   Medication Sig Start Date End Date Taking? Authorizing Provider  Cholecalciferol (VITAMIN D3) 25 MCG (1000 UT) CAPS Take 1,000 Units by mouth daily.   Yes [provider]  vitamin E 400 UNIT capsule Take 400 Units by mouth daily.   Yes [provider]     Vital Signs: BP (!) 150/86   Pulse 93   Temp 99.3 F (37.4 C) (Axillary)   Resp 14   Ht 5\' 6"  (1.676 m)   Wt 176 lb 5.9 oz (80 kg)   SpO2 100%   BMI 28.47 kg/m   Physical Exam G tube intact, insertion site ok with some old blood at site, abd soft  Imaging: Dg Abd 1 View  Result Date: 07/27/2018 CLINICAL DATA:  OG tube placement. EXAM: ABDOMEN - 1 VIEW COMPARISON:  None. FINDINGS: An OG tube is identified with tip overlying the proximal to mid stomach. Bowel gas pattern is unremarkable. IMPRESSION: OG tube with tip overlying the proximal to mid stomach. Electronically Signed   By: Harmon Pier M.D.   On: 07/27/2018 12:27   Ct Head Wo Contrast  Result Date: 07/28/2018 CLINICAL DATA:  Follow-up of intracranial hemorrhage EXAM: CT HEAD WITHOUT CONTRAST TECHNIQUE: Contiguous axial images were obtained from the base of the skull through the vertex without intravenous contrast. COMPARISON:  None. FINDINGS: Brain: There is multifocal intraparenchymal hemorrhage redemonstrated throughout the brain. The largest hematomas, in the left frontal lobe and at the left frontoparietal junction, are unchanged. The right frontal pole hematoma is also unchanged. Subarachnoid blood over the left convexity and in the right sylvian fissure is unchanged. The amount of intraventricular blood is unchanged. There is  rightward midline shift that measures 6 mm, unchanged. Vascular: No abnormal hyperdensity of the major intracranial arteries or dural venous sinuses. No intracranial atherosclerosis. Skull: The visualized skull base, calvarium and extracranial soft tissues are normal. Sinuses/Orbits: No fluid levels or advanced mucosal thickening of the visualized paranasal sinuses. No mastoid or middle ear effusion. The orbits are normal. IMPRESSION: Unchanged appearance of multifocal intraparenchymal and extra-axial hemorrhage throughout the brain, with unchanged 6 mm rightward midline shift. Electronically Signed   By: Deatra Robinson M.D.   On: 07/28/2018 04:42   Ir Gastrostomy Tube Mod Sed  Result Date: 07/30/2018 INDICATION: 72 year old with history of dementia and recent intracranial hemorrhage. Patient is currently intubated and will need long-term tube feedings for nutrition. EXAM: PERCUTANEOUS GASTROSTOMY TUBE WITH FLUOROSCOPIC GUIDANCE Physician: Rachelle Hora. Lowella Dandy, MD MEDICATIONS: Ancef 2 g; Antibiotics were administered within 1 hour of the procedure. Glucagon 1 mg IV ANESTHESIA/SEDATION: Patient was on a fentanyl drip during the procedure The patient was continuously monitored during the procedure by the interventional radiology nurse under my direct supervision. FLUOROSCOPY TIME:  Fluoroscopy Time: 3 minutes and 54 seconds, 13 mGy COMPLICATIONS: None immediate. PROCEDURE: Informed consent was obtained for a percutaneous gastrostomy tube. The patient was placed on the interventional table. An orogastric tube was placed with fluoroscopic guidance. The anterior abdomen was prepped and draped in sterile fashion. Maximal barrier sterile technique was utilized including caps, mask, sterile gowns, sterile gloves, sterile drape, hand hygiene and skin antiseptic. Stomach was inflated with air through the orogastric tube. The  skin and subcutaneous tissues were anesthetized with 1% lidocaine. A 17 gauge needle was directed into the  distended stomach with fluoroscopic guidance. A wire was advanced into the stomach and a T-tact was deployed. A 9-French vascular sheath was placed and the orogastric tube was snared using a [place] snare device. The orogastric tube and snare were pulled out of the patient's mouth. The snare device was connected to a 20-French gastrostomy tube. The snare device and gastrostomy tube were pulled through the patient's mouth and out the anterior abdominal wall. The gastrostomy tube was cut to an appropriate length. Contrast injection through gastrostomy tube confirmed placement within the stomach. Fluoroscopic images were obtained for documentation. The gastrostomy tube was flushed with normal saline. IMPRESSION: Successful fluoroscopic guided percutaneous gastrostomy tube placement. Electronically Signed   By: Richarda OverlieAdam  Henn M.D.   On: 07/30/2018 14:57   Ir Ivc Filter Plmt / S&i /img Guid/mod Sed  Result Date: 07/29/2018 CLINICAL DATA:  Lower extremity DVT. Acute intracranial hemorrhage, a relative contraindication to anticoagulation. Caval filtration requested. EXAM: INFERIOR VENACAVOGRAM IVC FILTER PLACEMENT UNDER FLUOROSCOPY FLUOROSCOPY TIME:  30 seconds; 11 mGy TECHNIQUE: Patency of the right IJ vein was confirmed with ultrasound with image documentation. An appropriate skin site was determined. Skin site was marked, prepped with chlorhexidine, and draped using maximum barrier technique. The region was infiltrated locally with 1% lidocaine. Intravenous Fentanyl and Versed were administered as conscious sedation during continuous monitoring of the patient's level of consciousness and physiological / cardiorespiratory status by the radiology RN, with a total moderate sedation time of 10 minutes. Under real-time ultrasound guidance, the right IJ vein was accessed with a 21 gauge micropuncture needle; the needle tip within the vein was confirmed with ultrasound image documentation. The needle was exchanged over a  018 guidewire for a transitional dilator, which allow advancement of the [facility] wire into the IVC. A long 6 French vascular sheath was placed for inferior venacavography. This demonstrated no caval thrombus. Renal vein inflows were evident. The [facility] IVC filter was advanced through the sheath and successfully deployed under fluoroscopy at the L2 level. Followup cavagram demonstrates stable filter position and no evident complication. The sheath was removed and hemostasis achieved at the site. No immediate complication. IMPRESSION: 1. Normal IVC. No thrombus or significant anatomic variation. 2. Technically successful infrarenal IVC filter placement. This is a retrievable model. PLAN: This IVC filter is potentially retrievable. The patient will be assessed for filter retrieval by Interventional Radiology in approximately 8-12 weeks. Further recommendations regarding filter retrieval, continued surveillance or declaration of device permanence, will be made at that time. Electronically Signed   By: Corlis Leak  Hassell M.D.   On: 07/29/2018 16:17   Dg Chest Port 1 View  Result Date: 07/31/2018 CLINICAL DATA:  Follow-up atelectasis EXAM: PORTABLE CHEST 1 VIEW COMPARISON:  07/30/2018 FINDINGS: Cardiac shadow is stable. Tracheostomy tube and right-sided PICC line are again noted and stable. The lungs are well aerated bilaterally. Previously seen density in the right base has improved consistent with resolving atelectasis. No new focal infiltrate is noted. There is a foreign body identified over the midportion of the chest below the left mainstem bronchus which has the appearance of a molar. Oblique or lateral imaging may be helpful for further evaluation. No other focal abnormality is noted. IMPRESSION: Tubes and lines as described above. Interval clearing of right basilar atelectasis. Foreign body that appears to be a molar in the midportion of the esophagus. This was not present on the most recent  exam which was obtained 4  hours after a gastrostomy tube placement. Oblique or lateral imaging may be helpful for further evaluation. These results will be called to the ordering clinician or representative by the Radiologist Assistant, and communication documented in the PACS or zVision Dashboard. Electronically Signed   By: Alcide Clever M.D.   On: 07/31/2018 08:13   Dg Chest Port 1 View  Result Date: 07/30/2018 CLINICAL DATA:  72 year old female status post tracheostomy. EXAM: PORTABLE CHEST 1 VIEW COMPARISON:  Portable chest 07/29/2018. FINDINGS: Portable AP semi upright view at 1622 hours. Extubated. Tracheostomy tube in place and projects over the tracheal air column with no adverse features identified. Enteric tube has been removed. Stable right PICC line. Stable lung volumes. Stable cardiac size and mediastinal contours. There is mild, vague increased medial right lung base opacity, but elsewhere Allowing for portable technique the lungs are clear. No pneumothorax. Negative visible bowel gas pattern. IMPRESSION: 1. Extubated and enteric tube removed, tracheostomy placed. No adverse features. 2. Stable lung volumes. Vague increased medial right lung base opacity. Atelectasis favored over aspiration or developing infection. Electronically Signed   By: Odessa Fleming M.D.   On: 07/30/2018 16:50   Dg Chest Port 1 View  Result Date: 07/29/2018 CLINICAL DATA:  Acute respiratory failure EXAM: PORTABLE CHEST 1 VIEW COMPARISON:  Portable chest x-ray of July 25, 2018 FINDINGS: The patient is mildly rotated on this study. The lungs are well-expanded. There is no focal infiltrate. There is no pleural effusion or pneumothorax. The endotracheal tube tip projects approximately 3.9 cm above the carina. The heart and pulmonary vascularity are normal. There is calcification in the wall of the thoracic aorta. The PICC line tip projects over the distal third of the SVC. The esophagogastric tube tip and proximal port project below the GE junction.  IMPRESSION: There is no active cardiopulmonary disease. There is reasonable positioning of the support tubes. Electronically Signed   By: David  [place] M.D.   On: 07/29/2018 08:51   [place] Ekg Site Rite  Result Date: 07/27/2018 If Site Rite image not attached, placement could not be confirmed due to current cardiac rhythm.   Labs:  CBC: Recent Labs    07/28/18 0600 07/29/18 0557 07/30/18 0616 07/31/18 0427  WBC 8.5 14.0* 9.9 7.6  HGB 11.6* 11.2* 10.5* 10.2*  HCT 37.8 37.7 35.7* 33.6*  PLT 142* 133* 128* 139*    COAGS: Recent Labs    07/24/18 1950 07/25/18 2019 07/28/18 0600  INR 1.09 1.09 1.34    BMP: Recent Labs    07/28/18 0600  07/29/18 0557  07/30/18 0616 07/30/18 1211 07/30/18 1800 07/31/18 0005 07/31/18 0427  NA 158*   < > 157*   < > 154* 153* 154* 152* 151*  K 3.8  --  3.5  --  3.1*  --   --   --  3.8  CL 128*  --  122*  --  120*  --   --   --  119*  CO2 25  --  29  --  30  --   --   --  27  GLUCOSE 147*  --  186*  --  181*  --   --   --  111*  BUN 16  --  20  --  17  --   --   --  15  CALCIUM 9.1  --  9.5  --  9.0  --   --   --  9.0  CREATININE  0.73  --  0.65  --  0.53  --   --   --  0.55  GFRNONAA >60  --  >60  --  >60  --   --   --  >60  GFRAA >60  --  >60  --  >60  --   --   --  >60   < > = values in this interval not displayed.    LIVER FUNCTION TESTS: Recent Labs    07/24/18 1950 07/25/18 0528  BILITOT 1.2 1.5*  AST 22 25  ALT 18 17  ALKPHOS 67 53  PROT 9.2* 7.8  ALBUMIN 3.8 3.1*    Assessment and Plan: Pt with hx dysphagia, recent intracranial hemorrhage; resp failure, s/p trach/bronch/gastrostomy tube yesterday; foreign body?tooth on CXR today (not seen on film yesterday afternoon)- for f/u CT chest today for further evaluation; WBC nl; hgb 10.2, creat nl;  ok to use G tube.   Electronically Signed: D. Jeananne Rama, PA-C 07/31/2018, 9:27 AM   I spent a total of 15 minutes at the the patient's bedside AND on the patient's hospital  floor or unit, greater than 50% of which was counseling/coordinating care for gastrostomy tube    Patient ID: Hannah Holloway, female   DOB: 1946/04/25, 72 y.o.   MRN: [id number]

## 2018-07-31 NOTE — Progress Notes (Signed)
..   NAME:  Hannah Holloway, MRN:  [id number], DOB:  May 12, 1946, LOS: 6 ADMISSION DATE:  07/24/2018, CONSULTATION DATE:  07/25/18 REFERRING MD:  Sunnie Nielsen, CHIEF COMPLAINT:  Altered mental status   Brief History   72 yr old female who was admitted on 07/24/18 for mgmt of Acute PE secondary to lower ext thrombosis from immobility started on Heparin gtt for anticoagulation. Experienced acute change in mental status while in MRI where pt was found to have a large left sided IPH and IVH with midline shift 10 mm.  Past Medical History  Vitamin D deficiency  Significant Hospital Events   11/20 Acute PE and b/l lower leg DVTs 11/21 New ICH, ETT 11/25 IVC filter placed by IR; off cleviprex 11/26 trach  Consults:  Neurology:11/21 Neurosurgery: 11/21  Procedures:  ETT 11/21 >> 11/26 PICC 11/23 >>  Trach 11/26 >> PEG 11/26 >>   Significant Diagnostic Tests:  CT angio chest 11/21 >> upper lobe PE, multifocal infiltrates, coronary calcification Dopper legs 11/21 >> acute deep vein thrombosis involving the right gastrocnemius vein, right posterior tibial vein, and right popliteal vein; acute deep vein thrombosis involving the left peroneal vein, left posterior tibial vein, and left popliteal vein.  MRI brain 11/21 >> anterior Lt frontal lobe and posterior Lt frontal and parietal lobe hemorrhage with 10 mm shift, intraventricular hemorrhage w/o hydrocephalus CT chest 11/24 >> midline shift 6 mm   Micro Data:  Blood 11/20 >> negative Urine 11/21 >> E coli  Antimicrobials:  Rocephin 11/21 >> 11/27 Azithromycin 11/21 >> 11/27  Interim history/subjective:  Remains on full vent support.  Had flexiseal placed overnight.  Objective   Blood pressure (!) 159/79, pulse 87, temperature 98.6 F (37 C), temperature source Axillary, resp. rate 14, height 5\' 6"  (1.676 m), weight 80 kg, SpO2 100 %.    Vent Mode: PRVC FiO2 (%):  [40 %-100 %] 40 % Set Rate:  [12 bmp-22 bmp] 12 bmp Vt Set:  [470 mL]  470 mL PEEP:  [5 cmH20] 5 cmH20 Plateau Pressure:  [11 cmH20-15 cmH20] 13 cmH20   Intake/Output Summary (Last 24 hours) at 07/31/2018 0803 Last data filed at 07/31/2018 0700 Gross per 24 hour  Intake 1570.89 ml  Output 2155 ml  Net -584.11 ml   Filed Weights   07/24/18 1918 07/30/18 0500 07/31/18 0500  Weight: 72.6 kg 78.7 kg 80 kg    Examination:  General - on vent Eyes - pupils reactive ENT - trach site clean Cardiac - regular rate/rhythm, no murmur Chest - equal breath sounds b/l, no wheezing or rales Abdomen - soft, non tender, PEG site clean Extremities - no cyanosis, clubbing, or edema Skin - no rashes Neuro - opens eyes spontaneously, not following commands  CXR 11/27 (reviewed by me) >> appears to have tooth in mid chest area.  Assessment & Plan:   Acute Intraparenchymal and Intraventricular Hemorrhage associated w/ cerebral edema and uncal herniation.  Plan - off 3% saline - neurology and neurosurgery following  Foreign body (tooth) possibly in airway. Plan - will get non contrast CT chest and then determine if she needs bronchoscopy of endoscopy  Acute PE with b/l lower leg DVT. Plan - IVC filter placed per IR 11/25  Acute respiratory failure with compromised airway. Failure to wean s/p tracheostomy. Plan - trach care - wean to trach collar as tolerated - f/u CXR intermittently  Hypertension. Plan - continue norvasc, cozaar, lopressor  Community acquired pneumonia. E coli UTI. Plan - d/c Abx after  dose on 11/27   Dysphagia. Plan - tube feeds via PEG  Hypokalemia Plan: - f/u BMET  Urinary retention Plan: - keep foley in for now   Best practice:  Diet: TF DVT prophylaxis: SCDs GI prophylaxis: pepcid Glucose control: SSI Mobility: bedrest Code Status: Full Code Family: updated family at bedside Disposition: family would like to arrange for [place]  Labs:   CMP Latest Ref Rng & Units 07/31/2018  07/31/2018 07/30/2018  Glucose 70 - 99 mg/dL 161(W111(H) - -  BUN 8 - 23 mg/dL 15 - -  Creatinine 9.600.44 - 1.00 mg/dL 4.540.55 - -  Sodium 098135 - 145 mmol/L 151(H) 152(H) 154(H)  Potassium 3.5 - 5.1 mmol/L 3.8 - -  Chloride 98 - 111 mmol/L 119(H) - -  CO2 22 - 32 mmol/L 27 - -  Calcium 8.9 - 10.3 mg/dL 9.0 - -  Total Protein 6.5 - 8.1 g/dL - - -  Total Bilirubin 0.3 - 1.2 mg/dL - - -  Alkaline Phos 38 - 126 U/L - - -  AST 15 - 41 U/L - - -  ALT 0 - 44 U/L - - -   CBC Latest Ref Rng & Units 07/31/2018 07/30/2018 07/29/2018  WBC 4.0 - 10.5 K/uL 7.6 9.9 14.0(H)  Hemoglobin 12.0 - 15.0 g/dL 10.2(L) 10.5(L) 11.2(L)  Hematocrit 36.0 - 46.0 % 33.6(L) 35.7(L) 37.7  Platelets 150 - 400 K/uL 139(L) 128(L) 133(L)   ABG    Component Value Date/Time   PHART 7.506 (H) 07/26/2018 1334   PCO2ART 21.3 (L) 07/26/2018 1334   PO2ART 116.0 (H) 07/26/2018 1334   HCO3 16.8 (L) 07/26/2018 1334   TCO2 17 (L) 07/26/2018 1334   ACIDBASEDEF 6.0 (H) 07/26/2018 1334   O2SAT 99.0 07/26/2018 1334   CBG (last 3)  Recent Labs    07/30/18 2046 07/31/18 0003 07/31/18 0425  GLUCAP 114* 100* 96   Coralyn HellingVineet Rowen Wilmer, MD [facility] 07/31/2018, 8:22 AM

## 2018-07-31 NOTE — Progress Notes (Signed)
eLink Physician-Brief Progress Note Patient Name: Hannah Holloway DOB: 1946/02/15 MRN: [id number]   Date of Service  07/31/2018  HPI/Events of Note  Multiple dark, watery stools today - Request for Flexiseal.   eICU Interventions  Will order: 1. Stool for occult blood. 2. Place Flexiseal.     Intervention Category Major Interventions: Other:  Lashawn Orrego Dennard Nipugene 07/31/2018, 1:24 AM

## 2018-07-31 NOTE — Progress Notes (Signed)
SLP Cancellation Note  Patient Details Name: Hannah Holloway MRN: [id number] DOB: 1946/05/18   Cancelled treatment:       Reason Eval/Treat Not Completed: Medical issues which prohibited therapy. Orders received - per chart review, pt remains on full vent support and not following commands. Will f/u for readiness.   Maxcine Hamaiewonsky, Saralyn Willison 07/31/2018, 10:46 AM  Maxcine HamLaura Paiewonsky, M.A. CCC-SLP [facility] Pager [phone] Office [phone]

## 2018-07-31 NOTE — Progress Notes (Signed)
STROKE TEAM PROGRESS NOTE   SUBJECTIVE (INTERVAL HISTORY) 72 RN is at the bedside.  Patient had trach y`day and is tolearting pressure support well. Slightly more arousable but not following commands. Off 3% saline   Na 151. Exam unchanged. Still not following commands  .     OBJECTIVE Vitals:   07/31/18 1200 07/31/18 1300 07/31/18 1400 07/31/18 1522  BP: 125/75 115/65 119/64   Pulse: 86 88 89   Resp: (!) 21 16 11    Temp: (!) 101.3 F (38.5 C)     TempSrc: Axillary     SpO2: 100% 100% 100% 100%  Weight:      Height:        CBC:  Recent Labs  Lab 07/24/18 1950  07/30/18 0616 07/31/18 0427  WBC 10.1   < > 9.9 7.6  NEUTROABS 8.7*  --   --   --   HGB 17.4*   < > 10.5* 10.2*  HCT 54.5*   < > 35.7* 33.6*  MCV 92.7   < > 98.3 98.0  PLT 239   < > 128* 139*   < > = values in this interval not displayed.    Basic Metabolic Panel:  Recent Labs  Lab 07/29/18 1738  07/30/18 0616  07/31/18 0427 07/31/18 1221  NA 157*   < > 154*   < > 151* 152*  K  --   --  3.1*  --  3.8  --   CL  --   --  120*  --  119*  --   CO2  --   --  30  --  27  --   GLUCOSE  --   --  181*  --  111*  --   BUN  --   --  17  --  15  --   CREATININE  --   --  0.53  --  0.55  --   CALCIUM  --   --  9.0  --  9.0  --   MG 2.3  --   --   --  2.4  --   PHOS 3.8  --   --   --  3.0  --    < > = values in this interval not displayed.    Lipid Panel:     Component Value Date/Time   TRIG 16 07/26/2018 1331   HgbA1c:  Lab Results  Component Value Date   HGBA1C 5.7 (H) 07/25/2018   Urine Drug Screen: No results found for: LABOPIA, COCAINSCRNUR, LABBENZ, AMPHETMU, THCU, LABBARB  Alcohol Level No results found for: ETH  IMAGING   Ct Head Wo Contrast  Result Date: 07/26/2018 CLINICAL DATA:  72 y/o  F; acute hemorrhage for follow-up. EXAM: CT HEAD WITHOUT CONTRAST TECHNIQUE: Contiguous axial images were obtained from the base of the skull through the vertex without intravenous contrast. COMPARISON:   07/25/2018 CT head.  07/25/2018 MRI head. FINDINGS: Brain: Stable left frontal hemorrhage measuring 4.5 x 3.9 x 3.9 cm (AP x ML x CC series 3, image 14 and series 5, image 16). Stable right anterior frontal hematoma measuring 21 mm (series 3, image 15). Stable left frontoparietal hemorrhage measuring 3.4 x 5.0 x 3.4 cm (AP x ML x CC series 5, image 45 and series 3, image 17). Stable 11 mm hemorrhage within the left lateral frontal lobe (series 3, image 17). Stable intraventricular hemorrhage pooling in the occipital horns of the lateral ventricles. Stable subarachnoid hemorrhage predominantly in the  left greater than right sylvian fissures and along the right anterior frontal lobe. Stable thin subdural hematoma along the left tentorium cerebelli. Stable 10 mm left-to-right midline shift of the anterior septum pellucidum. No new stroke, hemorrhage, focal mass effect, or herniation. Vascular: Calcific atherosclerosis of the carotid siphons. No hyperdense vessel identified. Skull: Normal. Negative for fracture or focal lesion. Sinuses/Orbits: No acute finding. Other: None. IMPRESSION: In comparison with the prior MRI of the head given differences in technique: 1. Stable acute hemorrhages within the left frontal lobe, right frontal lobe, left frontal parietal junction, and left lateral frontal lobe. 2. Stable intraventricular hemorrhage, thin subdural hematoma along the left tentorium, and small volume subarachnoid hemorrhage. 3. Stable 10 mm left-to-right midline shift of anterior septum pellucidum. 4. No new acute intracranial process identified. Electronically Signed   By: Mitzi Hansen M.D.   On: 07/26/2018 04:54   Mr Brain Wo Contrast  Result Date: 07/25/2018 CLINICAL DATA:  Acute mental status changes. Pulmonary embolus and DVT. EXAM: MRI HEAD WITHOUT CONTRAST TECHNIQUE: Multiplanar, multiecho pulse sequences of the brain and surrounding structures were obtained without intravenous contrast.  COMPARISON:  CT head without contrast 07/25/2018 12:25 a.m. FINDINGS: Brain: Multifocal areas of acute hemorrhage are present. Anterior left frontal lobe hemorrhage measures 4.0 x 4.5 x 4.3 Cm. A new hemorrhage in the posterior left frontal and left parietal lobe measures 5.5 x 3.0 x 3.5 cm. A smaller area of hemorrhage in the anterior inferior right frontal lobe measures 2.4 cm. Left right midline shift is 10 mm. There is effacement of the left lateral ventricle. Blood products are seen within the ventricles bilaterally. No hydrocephalus is evident. Posterior fossa is unremarkable. No acute parenchymal infarct is present otherwise. Vascular: Flow is present in the major intracranial arteries. Skull and upper cervical spine: Craniocervical junction is normal. Upper cervical spine is normal. Sinuses/Orbits: The left sphenoid sinus is partially opacified. No other significant paranasal sinus disease is present. Mastoid air cells are clear. Globes and orbits are within normal limits. IMPRESSION: 1. New large areas of hemorrhage in the anterior left frontal lobe and posterior left frontal and parietal lobe resulting in significant mass effect with left-to-right midline shift up to 10 mm. 2. Smaller area of hemorrhage in the anterior inferior right frontal lobe. 3. Intraventricular hemorrhage with fluid levels in the posterior horns of both lateral ventricles without hydrocephalus. Critical Value/emergent results were called by telephone at the time of interpretation on 07/25/2018 at 6:39 pm to Dr. Hartley Barefoot , who verbally acknowledged these results. Electronically Signed   By: Marin Roberts M.D.   On: 07/25/2018 19:01   Dg Chest Port 1 View  Result Date: 07/25/2018 CLINICAL DATA:  Status post intubation EXAM: PORTABLE CHEST 1 VIEW COMPARISON:  CT 07/25/2018 FINDINGS: Endotracheal tube with tip 4 cm from carina. Normal cardiac silhouette. Aorta is ectatic. Lungs are clear. IMPRESSION: Endotracheal tube  in good position. Electronically Signed   By: Genevive Bi M.D.   On: 07/25/2018 20:49   Vas [place] Lower Extremity Venous (dvt)  Result Date: 07/25/2018  Lower Venous Study Indications: SOB, and pulmonary embolism.  Risk Factors: Confirmed PE. Performing Technologist: Toma Deiters RVS  Examination Guidelines: A complete evaluation includes B-mode imaging, spectral Doppler, color Doppler, and power Doppler as needed of all accessible portions of each vessel. Bilateral testing is considered an integral part of a complete examination. Limited examinations for reoccurring indications may be performed as noted.  Right Venous Findings: +---------+---------------+---------+-----------+----------+------------------+  CompressibilityPhasicitySpontaneityPropertiesSummary            +---------+---------------+---------+-----------+----------+------------------+ CFV      Full           Yes      Yes                                     +---------+---------------+---------+-----------+----------+------------------+ SFJ      Full                                                            +---------+---------------+---------+-----------+----------+------------------+ FV Prox  Full           Yes      Yes                                     +---------+---------------+---------+-----------+----------+------------------+ FV Mid   Full                                                            +---------+---------------+---------+-----------+----------+------------------+ FV DistalFull           Yes      Yes                                     +---------+---------------+---------+-----------+----------+------------------+ PFV      Full           Yes      Yes                                     +---------+---------------+---------+-----------+----------+------------------+ POP      Partial        Yes      Yes                                      +---------+---------------+---------+-----------+----------+------------------+ PTV      Partial                                      Acute mid to                                                             proximal           +---------+---------------+---------+-----------+----------+------------------+ PERO  Difficult to                                                             visualize          +---------+---------------+---------+-----------+----------+------------------+ Gastroc  Partial                                      Acute              +---------+---------------+---------+-----------+----------+------------------+  Right Technical Findings: Difficult to position the leg for distal imaging due to dementia and patient poor to repond.  Left Venous Findings: +---------+---------------+---------+-----------+----------+-------------+          CompressibilityPhasicitySpontaneityPropertiesSummary       +---------+---------------+---------+-----------+----------+-------------+ CFV      Full           Yes      Yes                                +---------+---------------+---------+-----------+----------+-------------+ SFJ      Full                                                       +---------+---------------+---------+-----------+----------+-------------+ FV Prox  Full           Yes      Yes                                +---------+---------------+---------+-----------+----------+-------------+ FV Mid   Full                                                       +---------+---------------+---------+-----------+----------+-------------+ FV DistalFull           Yes      Yes                                +---------+---------------+---------+-----------+----------+-------------+ PFV      Full           Yes      Yes                                 +---------+---------------+---------+-----------+----------+-------------+ POP      Partial                                      Mid to distal +---------+---------------+---------+-----------+----------+-------------+ PTV      None                                                       +---------+---------------+---------+-----------+----------+-------------+  PERO     None                                                       +---------+---------------+---------+-----------+----------+-------------+  Left Technical Findings: Difficult to positin the lef for distal imaging due to dementia and patient poorto respond. Rouleux flow was noted in the proxiaml popliteal vein.   Summary: Right: Findings consistent with acute deep vein thrombosis involving the right gastrocnemius vein, right posterior tibial vein, and right popliteal vein. See technical findings listed above Left: Findings consistent with acute deep vein thrombosis involving the left peroneal vein, left posterior tibial vein, and left popliteal vein. See technical findings listed above  *See table(s) above for measurements and observations. Electronically signed by Sherald Hess MD on 07/25/2018 at 3:36:49 PM.    Final     Transthoracic Echocardiogram  Normal LV size with EF 65-70%. Normal RV size and systolic   function. Flattened mitral valve closure plane without frank   prolapse, mild to moderate mitral regurgitation. Mild pulmonary   hypertension.  Ct Head Wo Contrast  Result Date: 07/28/2018 CLINICAL DATA:  Follow-up of intracranial hemorrhage EXAM: CT HEAD WITHOUT CONTRAST TECHNIQUE: Contiguous axial images were obtained from the base of the skull through the vertex without intravenous contrast. COMPARISON:  None. FINDINGS: Brain: There is multifocal intraparenchymal hemorrhage redemonstrated throughout the brain. The largest hematomas, in the left frontal lobe and at the left frontoparietal junction, are unchanged.  The right frontal pole hematoma is also unchanged. Subarachnoid blood over the left convexity and in the right sylvian fissure is unchanged. The amount of intraventricular blood is unchanged. There is rightward midline shift that measures 6 mm, unchanged. Vascular: No abnormal hyperdensity of the major intracranial arteries or dural venous sinuses. No intracranial atherosclerosis. Skull: The visualized skull base, calvarium and extracranial soft tissues are normal. Sinuses/Orbits: No fluid levels or advanced mucosal thickening of the visualized paranasal sinuses. No mastoid or middle ear effusion. The orbits are normal. IMPRESSION: Unchanged appearance of multifocal intraparenchymal and extra-axial hemorrhage throughout the brain, with unchanged 6 mm rightward midline shift. Electronically Signed   By: Deatra Robinson M.D.   On: 07/28/2018 04:42    PHYSICAL EXAM  Temp:  [98.6 F (37 C)-101.3 F (38.5 C)] 101.3 F (38.5 C) (11/27 1200) Pulse Rate:  [80-102] 89 (11/27 1400) Resp:  [11-25] 11 (11/27 1400) BP: (102-159)/(64-106) 119/64 (11/27 1400) SpO2:  [94 %-100 %] 100 % (11/27 1522) FiO2 (%):  [30 %-100 %] 30 % (11/27 1522) Weight:  [80 kg] 80 kg (11/27 0500)  General -elderly african [place] lady , s/p tracheostomy not on sedation.  Ophthalmologic - fundi not visualized due to noncooperation.  Cardiovascular - Regular rhythm, no murmur or gallop  Neuro - s/p trach on ventilator, eyes closed, not open on voice but slightly open on pain stimulation.  Not following any commands.  Left pupil 4 mm, brisk to light, right pupil 2.5 mm, brisk to light.  Left gaze preference, no doll's eyes, bilateral corneal weak reflexes.  Gag and cough reflex present. LUE and LLE spontaneous movement noted intermittently.On pain summation LUE withdraw and localizing to pain, RUE trace withdraw.  LLE withdraws to pain   but RLE trace withdraw minimal..  DTR 1+, bilateral Babinski positive. Sensation, coordination and  gait not tested.   ASSESSMENT/PLAN Ms.  Janaiyah Holloway is a 72 y.o. female with history of dementia presenting with generalized weakness, found down at home, soaked in urine.  She was admitted to [facility] and found to have bilateral PE on chest CT and bilateral DVT and lower extremities.  She was put on heparin IV but then developed worsening mental status for which MRI was done showed left frontal anterior and posterior ICH with small right frontal ICH and IVH.  Patient transferred to [facility] for further evaluation.   ICH: Left anterior and posterior frontal large hematoma, right anterior frontal small hematoma, and IVH bilaterally.  Heparin was reversed with protamine.  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  Resultant unresponsive, intubated  CT head 07/25/2018 no acute abnormality  MRI head showed left anterior and posterior frontal large hematoma, right anterior frontal small hematoma and IVH. However, SWI sequence was not able to obtain due to noncooperation.  CT head repeat 07/26/18 confirmed ICH and IVH as above  CT repeat 08/27/18 stable hematoma and improved MLS  Neurosurgery consulted, Dr. Venetia Maxon recommended against surgical intervention.  2D Echo - EF 65 to 70%  HgbA1c 5.7  VTE prophylaxis - SCDs  No antithrombotic prior to admission, was treated with heparin IV for PE/DVT, now on No antithrombotic given ICH.  Ongoing aggressive stroke risk factor management  Therapy recommendations:  pending  Disposition:  Pending Poor prognosis and son has transferred POA to pt brother Zola Button MD who is neurosurgeon in [place]. Pt another brother Myrtie Neither MD was orthopedics in [place].   Cerebral edema and uncal herniation  CT and MRI showed midline shift 10 mm  anisocoria with L>R, but papillary reflex present bilaterally  CT repeat  stable hematoma but improved MLS  Mannitol 1 dose given on admission  Currently off 3% saline  Pt has PICC line  Sodium 144->153->160->157->158  Sodium goal 150 - 160  Sodium check every 6 hours  On tube feeding   Bilateral PE and DVT  Patient found down at home, soaked in urine  Chest CT showed bilateral upper lobe PE  LE venous Doppler showed bilateral DVT  Was on heparin IV which has been discontinued and reversed due to ICH  Respiratory distress  Intubated for not protecting airway  On ventilation  CCM on board  Sepsis  CT chest showed multifocal pneumonia  UA WBC 21-50  Urine culture showed gram-negative rods  Leukocytosis with WBC 11.1->11.0->8.5  Currently on azithromycin, Rocephin  Overnight Tmax 100.1  Hypertension  Stable on cleviprex . BP goal < 140 . Wean off cleviprex as able . Now on po meds with amlodipine, losartan and metoprolol . Long-term BP goal normotensive  Dysphagia   Did not pass swallow  Has OG tube  Will start TF today  Other Stroke Risk Factors  Advanced age  Other Active Problems  Baseline dementia    Hospital day # 6 Plan    Continue weaning ventilatory support on tracheostomy. Check CT scan of the chest to evaluate for displaced both in the esophagus. Long discussion with Dr. Craige Cotta and answered questions..Family wants [facility] [place] later. Continue to hold hypertonic saline till Na falls below 150. This patient is critically ill due to extensive ICH, IVH, bilateral PE/DVT, cerebral edema and uncal herniation, sepsis with pneumonia and UTI and at significant risk of neurological worsening, death form uncal herniation, cerebral  edema, heart failure, respiratory failure, seizure and sepsis as well as brain death. This patient's care requires constant monitoring of vital signs, hemodynamics, respiratory and cardiac monitoring, review of multiple databases, neurological assessment, discussion with  family, other specialists and medical decision making of high complexity. I spent 35 minutes of neurocritical care time in the care of this patient.    Delia Heady, MD Stroke Neurology 07/31/2018 3:55 PM     To contact Stroke Continuity provider, please refer to [email or web address]. After hours, contact General Neurology

## 2018-08-01 ENCOUNTER — Inpatient Hospital Stay (HOSPITAL_COMMUNITY): Payer: Medicare Other

## 2018-08-01 LAB — BASIC METABOLIC PANEL
Anion gap: 5 (ref 5–15)
BUN: 14 mg/dL (ref 8–23)
CHLORIDE: 122 mmol/L — AB (ref 98–111)
CO2: 24 mmol/L (ref 22–32)
Calcium: 8.5 mg/dL — ABNORMAL LOW (ref 8.9–10.3)
Creatinine, Ser: 0.65 mg/dL (ref 0.44–1.00)
GFR calc non Af Amer: >60 mL/min (ref >60)
Glucose, Bld: 169 mg/dL — ABNORMAL HIGH (ref 70–99)
POTASSIUM: 3.6 mmol/L (ref 3.5–5.1)
SODIUM: 151 mmol/L — AB (ref 135–145)

## 2018-08-01 LAB — GLUCOSE, CAPILLARY
GLUCOSE-CAPILLARY: 121 mg/dL — AB (ref 70–99)
GLUCOSE-CAPILLARY: 153 mg/dL — AB (ref 70–99)
GLUCOSE-CAPILLARY: 159 mg/dL — AB (ref 70–99)
Glucose-Capillary: 119 mg/dL — ABNORMAL HIGH (ref 70–99)
Glucose-Capillary: 138 mg/dL — ABNORMAL HIGH (ref 70–99)
Glucose-Capillary: 139 mg/dL — ABNORMAL HIGH (ref 70–99)
Glucose-Capillary: 149 mg/dL — ABNORMAL HIGH (ref 70–99)

## 2018-08-01 LAB — SODIUM
SODIUM: 152 mmol/L — AB (ref 135–145)
Sodium: 152 mmol/L — ABNORMAL HIGH (ref 135–145)
Sodium: 152 mmol/L — ABNORMAL HIGH (ref 135–145)

## 2018-08-01 LAB — CBC
HCT: 30 % — ABNORMAL LOW (ref 36.0–46.0)
Hemoglobin: 9 g/dL — ABNORMAL LOW (ref 12.0–15.0)
MCH: 29.3 pg (ref 26.0–34.0)
MCHC: 30 g/dL (ref 30.0–36.0)
MCV: 97.7 fL (ref 80.0–100.0)
NRBC: 0 % (ref 0.0–0.2)
Platelets: 144 10*3/uL — ABNORMAL LOW (ref 150–400)
RBC: 3.07 MIL/uL — ABNORMAL LOW (ref 3.87–5.11)
RDW: 15.3 % (ref 11.5–15.5)
WBC: 7.2 10*3/uL (ref 4.0–10.5)

## 2018-08-01 MED ORDER — POTASSIUM CHLORIDE 20 MEQ/15ML (10%) PO SOLN
20.0000 meq | ORAL | Status: AC
  Administered 2018-08-01 (×2): 20 meq
  Filled 2018-08-01 (×2): qty 15

## 2018-08-01 NOTE — Consult Note (Signed)
Referring Provider:  Dr. Ann Lions Primary Care Physician:  Patient, No Pcp Per Primary Gastroenterologist: None (unassigned)  Reason for Consultation: Foreign body in the intestinal tract  HPI: Hannah Holloway is a 72 y.o. female with history of pulmonary embolism, admitted approximately a week ago, who developed intracranial hemorrhage while on heparin.  She is currently intubated, and on the ventilator.  She has been noted on recent x-rays to have a foreign body (consistent with a molar) in the GI tract in my opinion has been requested regarding its retrieval.  I have reviewed the patient's KUB from today.  The molar is at the level of the iliac crest, almost certainly beyond the duodenal sweep (although the scout film for her CT scan had suggested that it might be in the distal second portion the duodenum).   History reviewed. No pertinent past medical history.  Past Surgical History:  Procedure Laterality Date  . IR GASTROSTOMY TUBE MOD SED  07/30/2018  . IR IVC FILTER PLMT / S&I /IMG GUID/MOD SED  07/29/2018    Prior to Admission medications   Medication Sig Start Date End Date Taking? Authorizing Provider  Cholecalciferol (VITAMIN D3) 25 MCG (1000 UT) CAPS Take 1,000 Units by mouth daily.   Yes [provider]  vitamin E 400 UNIT capsule Take 400 Units by mouth daily.   Yes [provider]    Current Facility-Administered Medications  Medication Dose Route Frequency Provider Last Rate Last Dose  . 0.9 %  sodium chloride infusion   Intravenous Continuous Garvin Fila, MD 50 mL/hr at 08/01/18 1000    . acetaminophen (TYLENOL) tablet 650 mg  650 mg Per Tube Q6H PRN Chesley Mires, MD   650 mg at 07/31/18 1236   Or  . acetaminophen (TYLENOL) suppository 650 mg  650 mg Rectal Q6H PRN Chesley Mires, MD      . amLODipine (NORVASC) tablet 10 mg  10 mg Per Tube Daily Chesley Mires, MD   10 mg at 08/01/18 0954  . chlorhexidine gluconate (MEDLINE KIT) (PERIDEX) 0.12 %  solution 15 mL  15 mL Mouth Rinse BID Chesley Mires, MD   15 mL at 08/01/18 0725  . Chlorhexidine Gluconate Cloth 2 % PADS 6 each  6 each Topical Daily Kipp Brood, MD   6 each at 08/01/18 0200  . famotidine (PEPCID) 40 MG/5ML suspension 20 mg  20 mg Per Tube Q12H Chesley Mires, MD   20 mg at 08/01/18 0954  . feeding supplement (OSMOLITE 1.2 CAL) liquid 1,000 mL  1,000 mL Per Tube Continuous Chesley Mires, MD 55 mL/hr at 08/01/18 0520 1,000 mL at 08/01/18 0520  . feeding supplement (PRO-STAT SUGAR FREE 64) liquid 30 mL  30 mL Per Tube Daily Chesley Mires, MD   30 mL at 08/01/18 0954  . ipratropium-albuterol (DUONEB) 0.5-2.5 (3) MG/3ML nebulizer solution 3 mL  3 mL Nebulization Q4H PRN Chesley Mires, MD      . labetalol (NORMODYNE,TRANDATE) injection 10-40 mg  10-40 mg Intravenous Q10 min PRN Garvin Fila, MD   40 mg at 08/01/18 0524  . losartan (COZAAR) tablet 50 mg  50 mg Per Tube BID Chesley Mires, MD   50 mg at 08/01/18 0955  . MEDLINE mouth rinse  15 mL Mouth Rinse Q4H Chesley Mires, MD   15 mL at 08/01/18 1230  . metoprolol tartrate (LOPRESSOR) tablet 50 mg  50 mg Per Tube BID Chesley Mires, MD   50 mg at 08/01/18 0955  . ondansetron (  ZOFRAN) tablet 4 mg  4 mg Oral Q6H PRN Toy Baker, MD       Or  . ondansetron (ZOFRAN) injection 4 mg  4 mg Intravenous Q6H PRN Doutova, Anastassia, MD      . potassium chloride 20 MEQ/15ML (10%) solution 40 mEq  40 mEq Oral BID Magdalen Spatz, NP   40 mEq at 08/01/18 0954  . sodium chloride flush (NS) 0.9 % injection 10-40 mL  10-40 mL Intracatheter Q12H Kipp Brood, MD   10 mL at 08/01/18 0948  . sodium chloride flush (NS) 0.9 % injection 10-40 mL  10-40 mL Intracatheter PRN Kipp Brood, MD        Allergies as of 07/24/2018  . (No Known Allergies)    Family History  Problem Relation Age of Onset  . Prostate cancer Father   . Prostate cancer Brother   . CAD Neg Hx     Social History   Socioeconomic History  . Marital status: Single     Spouse name: Not on file  . Number of children: Not on file  . Years of education: Not on file  . Highest education level: Not on file  Occupational History  . Not on file  Social Needs  . Financial resource strain: Not on file  . Food insecurity:    Worry: Not on file    Inability: Not on file  . Transportation needs:    Medical: Not on file    Non-medical: Not on file  Tobacco Use  . Smoking status: Never Smoker  . Smokeless tobacco: Never Used  Substance and Sexual Activity  . Alcohol use: Never    Frequency: Never  . Drug use: Never  . Sexual activity: Not on file  Lifestyle  . Physical activity:    Days per week: Not on file    Minutes per session: Not on file  . Stress: Not on file  Relationships  . Social connections:    Talks on phone: Not on file    Gets together: Not on file    Attends religious service: Not on file    Active member of club or organization: Not on file    Attends meetings of clubs or organizations: Not on file    Relationship status: Not on file  . Intimate partner violence:    Fear of current or ex partner: Not on file    Emotionally abused: Not on file    Physically abused: Not on file    Forced sexual activity: Not on file  Other Topics Concern  . Not on file  Social History Narrative  . Not on file    Review of Systems: Not obtainable  Physical Exam: Vital signs in last 24 hours: Temp:  [98.3 F (36.8 C)-101.3 F (38.5 C)] 101.3 F (38.5 C) (11/28 1200) Pulse Rate:  [82-110] 90 (11/28 1400) Resp:  [14-33] 17 (11/28 1300) BP: (90-174)/(51-99) 106/66 (11/28 1400) SpO2:  [96 %-100 %] 100 % (11/28 1400) FiO2 (%):  [30 %] 30 % (11/28 1224) Weight:  [74.9 kg] 74.9 kg (11/28 0200) Last BM Date: 08/01/18  Overweight African-American female on the ventilator, obtunded, no evident distress.  Grimaces and moves around to some degree when I examined the abdomen, but there is no abdominal guarding, no rigidity, no peritoneal  findings.   Intake/Output from previous day: 11/27 0701 - 11/28 0700 In: 2429.8 [I.V.:1172.1; NG/GT:1257.7] Out: 2424 [Urine:2074; Drains:250; Stool:100] Intake/Output this shift: Total I/O In: 570.1 [I.V.:350.1; NG/GT:220] Out:  200 [Urine:200]  Lab Results: Recent Labs    07/30/18 0616 07/31/18 0427 08/01/18 0418  WBC 9.9 7.6 7.2  HGB 10.5* 10.2* 9.0*  HCT 35.7* 33.6* 30.0*  PLT 128* 139* 144*   BMET Recent Labs    07/30/18 0616  07/31/18 0427  08/01/18 0031 08/01/18 0418 08/01/18 1231  NA 154*   < > 151*   < > 152* 151* 152*  K 3.1*  --  3.8  --   --  3.6  --   CL 120*  --  119*  --   --  122*  --   CO2 30  --  27  --   --  24  --   GLUCOSE 181*  --  111*  --   --  169*  --   BUN 17  --  15  --   --  14  --   CREATININE 0.53  --  0.55  --   --  0.65  --   CALCIUM 9.0  --  9.0  --   --  8.5*  --    < > = values in this interval not displayed.   LFT No results for input(s): PROT, ALBUMIN, AST, ALT, ALKPHOS, BILITOT, BILIDIR, IBILI in the last 72 hours. PT/INR No results for input(s): LABPROT, INR in the last 72 hours.  Studies/Results: Ct Chest Wo Contrast  Result Date: 07/31/2018 CLINICAL DATA:  Foreign body (tooth) in esophagus versus lung status post bronchoscopy and tracheostomy yesterday EXAM: CT CHEST WITHOUT CONTRAST TECHNIQUE: Multidetector CT imaging of the chest was performed following the standard protocol without IV contrast. COMPARISON:  Chest radiograph dated 07/31/2018 FINDINGS: Cardiovascular: Heart is normal in size.  No pericardial effusion. No evidence of thoracic aortic aneurysm. Atherosclerotic calcifications of the aortic arch. Three vessel coronary atherosclerosis. Right arm PICC terminates at the cavoatrial junction. Mediastinum/Nodes: No suspicious mediastinal lymphadenopathy. Visualized thyroid is unremarkable. Lungs/Pleura: Tracheostomy terminates 4.4 cm above the carina. No radiopaque foreign body is seen within the airway. Mild patchy  opacity in the dependent right lower lobe (series 6/image 112), likely atelectasis, less likely aspiration. Mild left basilar atelectasis. No suspicious pulmonary nodules. Mild biapical pleural-parenchymal scarring. No pleural effusion or pneumothorax. Upper Abdomen: Visualized upper abdomen is grossly unremarkable on CT, noting vascular calcifications. However, on the scout radiograph, the radiopaque foreign body (tooth) overlies the right mid abdomen, likely within the distal 2nd portion of the duodenum. Musculoskeletal: Mild degenerative changes of the midthoracic spine. IMPRESSION: Radiopaque foreign body (tooth) overlies the right mid abdomen on the CT scout radiograph, likely within the distal 2nd portion of the duodenum. No radiopaque foreign body is seen within the airway. Tracheostomy in satisfactory position, terminating 4.4 cm above the carina. Mild patchy opacity in the dependent right lower lobe, likely atelectasis, less likely aspiration. Mild left basilar atelectasis. Aortic Atherosclerosis (ICD10-I70.0). Electronically Signed   By: Julian Hy M.D.   On: 07/31/2018 11:15   Dg Chest Port 1 View  Result Date: 08/01/2018 CLINICAL DATA:  Acute respiratory failure EXAM: PORTABLE CHEST 1 VIEW COMPARISON:  Chest x-rays dated 07/31/2018 and 07/30/2018. FINDINGS: Tracheostomy appears appropriately positioned in the midline. RIGHT-sided PICC line appears adequately position with tip at the level of the lower SVC. Heart size and mediastinal contours are stable. Lungs are clear. No pleural effusion or pneumothorax seen. IMPRESSION: No active disease. No evidence of pneumonia or pulmonary edema. Electronically Signed   By: Franki Cabot M.D.   On: 08/01/2018 07:58  Dg Chest Port 1 View  Result Date: 07/31/2018 CLINICAL DATA:  Follow-up atelectasis EXAM: PORTABLE CHEST 1 VIEW COMPARISON:  07/30/2018 FINDINGS: Cardiac shadow is stable. Tracheostomy tube and right-sided PICC line are again noted and  stable. The lungs are well aerated bilaterally. Previously seen density in the right base has improved consistent with resolving atelectasis. No new focal infiltrate is noted. There is a foreign body identified over the midportion of the chest below the left mainstem bronchus which has the appearance of a molar. Oblique or lateral imaging may be helpful for further evaluation. No other focal abnormality is noted. IMPRESSION: Tubes and lines as described above. Interval clearing of right basilar atelectasis. Foreign body that appears to be a molar in the midportion of the esophagus. This was not present on the most recent exam which was obtained 4 hours after a gastrostomy tube placement. Oblique or lateral imaging may be helpful for further evaluation. These results will be called to the ordering clinician or representative by the Radiologist Assistant, and communication documented in the PACS or zVision Dashboard. Electronically Signed   By: Inez Catalina M.D.   On: 07/31/2018 08:13   Dg Chest Port 1 View  Result Date: 07/30/2018 CLINICAL DATA:  72 year old female status post tracheostomy. EXAM: PORTABLE CHEST 1 VIEW COMPARISON:  Portable chest 07/29/2018. FINDINGS: Portable AP semi upright view at 1622 hours. Extubated. Tracheostomy tube in place and projects over the tracheal air column with no adverse features identified. Enteric tube has been removed. Stable right PICC line. Stable lung volumes. Stable cardiac size and mediastinal contours. There is mild, vague increased medial right lung base opacity, but elsewhere Allowing for portable technique the lungs are clear. No pneumothorax. Negative visible bowel gas pattern. IMPRESSION: 1. Extubated and enteric tube removed, tracheostomy placed. No adverse features. 2. Stable lung volumes. Vague increased medial right lung base opacity. Atelectasis favored over aspiration or developing infection. Electronically Signed   By: Genevie Ann M.D.   On: 07/30/2018 16:50    Dg Abd Portable 1v  Result Date: 08/01/2018 CLINICAL DATA:  Tooth knocked out, unspecified edentulism EXAM: PORTABLE ABDOMEN - 1 VIEW COMPARISON:  Chest x-ray dated 07/31/2018. Chest CT dated 07/31/2018. FINDINGS: The tooth again overlies the RIGHT lower abdomen, similar position to the scout film on CT of 07/31/2018. Additional thin linear foreign body immediately adjacent to the tooth, presumably associated. Visualized bowel gas pattern is nonobstructive. IVC filter is stable in position. Gastrostomy tube in place. IMPRESSION: 1. Stable location of the tooth in the RIGHT lower abdomen when compared to scout film for chest CT of 07/31/2018. Additional thin linear foreign body immediately adjacent to the tooth, presumably associated. 2. Visualized bowel gas pattern is nonobstructive. Moderate amount of air within the stomach. Electronically Signed   By: Franki Cabot M.D.   On: 08/01/2018 10:14    Impression: It is highly unlikely that this foreign body is endoscopically retrievable, given its current location.  Fortunately, based on its size and morphology, I think it is unlikely to cause any potential complications of ingested foreign bodies within the GI tract, such as either perforation or obstruction.  Plan: Taking into account the patient's poor medical status and the low likelihood that this foreign body is in a section of the intestine that is amenable to endoscopic retrieval, I do not feel that attempts at endoscopic retrieval are appropriate.  Since this foreign body is unlikely to cause the patient any problems, I do not feel it is necessary to  monitor the progress of this item through the GI tract by serial radiographs.  I will sign off, but please call us back if any concerns arise or if we can be of further assistance in this patient's care.   LOS: 7 days   Youlanda Mighty Yassmin Binegar  08/01/2018, 2:36 PM   Pager [phone] If no answer or after 5 PM call [phone]

## 2018-08-01 NOTE — Progress Notes (Signed)
[facility]  The [facility] based on the criteria listed below:   1. Is GFR >/= 40 ml/min? Yes.    Patient's GFR today is >60 2. Is urine output >/= 0.5 ml/kg/hr for the last 6 hours? Yes.   Patient's UOP is 1.5 ml/kg/hr 3. Is BUN < 60 mg/dL? Yes.    Patient's BUN today is 14 4. Abnormal electrolyte(s): K-3.6 5. Ordered repletion with: per protocol 6. If a panic level lab has been reported, has the CCM MD in charge been notified? Yes.  .   Physician:  Dr. Loney HeringAventura  Hannah Holloway, Hannah BoosMaria Holloway 08/01/2018 6:06 AM

## 2018-08-01 NOTE — Progress Notes (Signed)
..   NAME:  Hannah Holloway, MRN:  [id number], DOB:  06-20-1946, LOS: 7 ADMISSION DATE:  07/24/2018, CONSULTATION DATE:  07/25/18 REFERRING MD:  Sunnie Nielsen, CHIEF COMPLAINT:  Altered mental status   Brief History   72 yr old female who was admitted on 07/24/18 for mgmt of Acute PE secondary to lower ext thrombosis from immobility started on Heparin gtt for anticoagulation. Experienced acute change in mental status while in MRI where pt was found to have a large left sided IPH and IVH with midline shift 10 mm.  Past Medical History  Vitamin D deficiency  Significant Hospital Events   11/20 Acute PE and b/l lower leg DVTs 11/21 New ICH, ETT 11/25 IVC filter placed by IR; off cleviprex 11/26 trach 11/27  - Remains on full vent support.  Had flexiseal placed overnight.  Consults:  Neurology:11/21 Neurosurgery: 11/21  Procedures:  ETT 11/21 >> 11/26 PICC 11/23 >>  Trach 11/26 >> PEG 11/26 >>   Significant Diagnostic Tests:  CT angio chest 11/21 >> upper lobe PE, multifocal infiltrates, coronary calcification Dopper legs 11/21 >> acute deep vein thrombosis involving the right gastrocnemius vein, right posterior tibial vein, and right popliteal vein; acute deep vein thrombosis involving the left peroneal vein, left posterior tibial vein, and left popliteal vein.  MRI brain 11/21 >> anterior Lt frontal lobe and posterior Lt frontal and parietal lobe hemorrhage with 10 mm shift, intraventricular hemorrhage w/o hydrocephalus CT chest 11/24 >> midline shift 6 mm   Micro Data:  Blood 11/20 >> negative Urine 11/21 >> E coli  Antimicrobials:  Rocephin 11/21 >> 11/27 Azithromycin 11/21 >> 11/27  Interim history/subjective:    11/28 - doing SBT. Per neuro - family willing to take patient to go to LT[facility] in [place] (Son NSGY there). Unresponsive. On cleviprex.  Noted - CT chest 07/31/18 - tooth in d2 (originally in esophagus per RN)  Objective   Blood pressure 90/75, pulse 86,  temperature 98.3 F (36.8 C), temperature source Axillary, resp. rate (!) 33, height 5\' 6"  (1.676 m), weight 74.9 kg, SpO2 100 %.    Vent Mode: PSV;CPAP FiO2 (%):  [30 %-40 %] 30 % Set Rate:  [12 bmp] 12 bmp Vt Set:  [470 mL] 470 mL PEEP:  [5 cmH20] 5 cmH20 Pressure Support:  [10 cmH20] 10 cmH20 Plateau Pressure:  [13 cmH20-16 cmH20] 13 cmH20   Intake/Output Summary (Last 24 hours) at 08/01/2018 0915 Last data filed at 08/01/2018 0800 Gross per 24 hour  Intake 2382 ml  Output 2424 ml  Net -42 ml   Filed Weights   07/30/18 0500 07/31/18 0500 08/01/18 0200  Weight: 78.7 kg 80 kg 74.9 kg    Examination: General Appearance:  Looks chronic critically ill Head:  Normocephalic, without obvious abnormality, atraumatic Eyes:  PERRL - yes, conjunctiva/corneas - clera     Ears:  Normal external ear canals, both ears Nose:  intact Throat:  ETT TUBE - no , OG tube - no. TRACH + Neck:  Supple,  No enlargement/tenderness/nodules Lungs: Clear to auscultation bilaterally, Ventilator   Synchrony - yes Heart:  S1 and S2 normal, no murmur, CVP - no.  Pressors - no Abdomen:  Soft, no masses, no organomegaly Genitalia / Rectal:  Not done bu thas flexiseal Extremities:  Extremities- intact, boots on Skin:  ntact in exposed areas . Sacral area - not examind Neurologic:  Sedation - none -> RASS - -4      LABS    PULMONARY Recent Labs  Lab 07/25/18 2039 07/26/18 1334  PHART 7.482* 7.506*  PCO2ART 32.2 21.3*  PO2ART 382* 116.0*  HCO3 23.9 16.8*  TCO2  --  17*  O2SAT 99.9 99.0    CBC Recent Labs  Lab 07/30/18 0616 07/31/18 0427 08/01/18 0418  HGB 10.5* 10.2* 9.0*  HCT 35.7* 33.6* 30.0*  WBC 9.9 7.6 7.2  PLT 128* 139* 144*    COAGULATION Recent Labs  Lab 07/25/18 2019 07/28/18 0600  INR 1.09 1.34    CARDIAC   Recent Labs  Lab 07/25/18 1718  TROPONINI 0.06*   No results for input(s): PROBNP in the last 168 hours.   CHEMISTRY Recent Labs  Lab 07/28/18 0600  07/28/18 1140 07/28/18 1734  07/29/18 0557  07/29/18 1738  07/30/18 0616  07/31/18 0427 07/31/18 1221 07/31/18 1806 08/01/18 0031 08/01/18 0418  NA 158* 156* 156*   < > 157*   < > 157*   < > 154*   < > 151* 152* 152* 152* 151*  K 3.8  --   --   --  3.5  --   --   --  3.1*  --  3.8  --   --   --  3.6  CL 128*  --   --   --  122*  --   --   --  120*  --  119*  --   --   --  122*  CO2 25  --   --   --  29  --   --   --  30  --  27  --   --   --  24  GLUCOSE 147*  --   --   --  186*  --   --   --  181*  --  111*  --   --   --  169*  BUN 16  --   --   --  20  --   --   --  17  --  15  --   --   --  14  CREATININE 0.73  --   --   --  0.65  --   --   --  0.53  --  0.55  --   --   --  0.65  CALCIUM 9.1  --   --   --  9.5  --   --   --  9.0  --  9.0  --   --   --  8.5*  MG 2.4 2.4 2.3  --  2.3  --  2.3  --   --   --  2.4  --   --   --   --   PHOS  --  2.1* 2.4*  --  4.1  --  3.8  --   --   --  3.0  --   --   --   --    < > = values in this interval not displayed.   Estimated Creatinine Clearance: 63.8 mL/min (by C-G formula based on SCr of 0.65 mg/dL).   LIVER Recent Labs  Lab 07/25/18 2019 07/28/18 0600  INR 1.09 1.34     INFECTIOUS No results for input(s): LATICACIDVEN, PROCALCITON in the last 168 hours.   ENDOCRINE CBG (last 3)  Recent Labs    08/01/18 0025 08/01/18 0423 08/01/18 0843  GLUCAP 149* 138* 159*         IMAGING x48h  - image(s) personally visualized  -  highlighted in bold Ct Chest Wo Contrast  Result Date: 07/31/2018 CLINICAL DATA:  Foreign body (tooth) in esophagus versus lung status post bronchoscopy and tracheostomy yesterday EXAM: CT CHEST WITHOUT CONTRAST TECHNIQUE: Multidetector CT imaging of the chest was performed following the standard protocol without IV contrast. COMPARISON:  Chest radiograph dated 07/31/2018 FINDINGS: Cardiovascular: Heart is normal in size.  No pericardial effusion. No evidence of thoracic aortic aneurysm. Atherosclerotic  calcifications of the aortic arch. Three vessel coronary atherosclerosis. Right arm PICC terminates at the cavoatrial junction. Mediastinum/Nodes: No suspicious mediastinal lymphadenopathy. Visualized thyroid is unremarkable. Lungs/Pleura: Tracheostomy terminates 4.4 cm above the carina. No radiopaque foreign body is seen within the airway. Mild patchy opacity in the dependent right lower lobe (series 6/image 112), likely atelectasis, less likely aspiration. Mild left basilar atelectasis. No suspicious pulmonary nodules. Mild biapical pleural-parenchymal scarring. No pleural effusion or pneumothorax. Upper Abdomen: Visualized upper abdomen is grossly unremarkable on CT, noting vascular calcifications. However, on the scout radiograph, the radiopaque foreign body (tooth) overlies the right mid abdomen, likely within the distal 2nd portion of the duodenum. Musculoskeletal: Mild degenerative changes of the midthoracic spine. IMPRESSION: Radiopaque foreign body (tooth) overlies the right mid abdomen on the CT scout radiograph, likely within the distal 2nd portion of the duodenum. No radiopaque foreign body is seen within the airway. Tracheostomy in satisfactory position, terminating 4.4 cm above the carina. Mild patchy opacity in the dependent right lower lobe, likely atelectasis, less likely aspiration. Mild left basilar atelectasis. Aortic Atherosclerosis (ICD10-I70.0). Electronically Signed   By: Charline BillsSriyesh  Krishnan M.D.   On: 07/31/2018 11:15   Ir Gastrostomy Tube Mod Sed  Result Date: 07/30/2018 INDICATION: 72 year old with history of dementia and recent intracranial hemorrhage. Patient is currently intubated and will need long-term tube feedings for nutrition. EXAM: PERCUTANEOUS GASTROSTOMY TUBE WITH FLUOROSCOPIC GUIDANCE Physician: Rachelle HoraAdam R. Lowella DandyHenn, MD MEDICATIONS: Ancef 2 g; Antibiotics were administered within 1 hour of the procedure. Glucagon 1 mg IV ANESTHESIA/SEDATION: Patient was on a fentanyl drip during  the procedure The patient was continuously monitored during the procedure by the interventional radiology nurse under my direct supervision. FLUOROSCOPY TIME:  Fluoroscopy Time: 3 minutes and 54 seconds, 13 mGy COMPLICATIONS: None immediate. PROCEDURE: Informed consent was obtained for a percutaneous gastrostomy tube. The patient was placed on the interventional table. An orogastric tube was placed with fluoroscopic guidance. The anterior abdomen was prepped and draped in sterile fashion. Maximal barrier sterile technique was utilized including caps, mask, sterile gowns, sterile gloves, sterile drape, hand hygiene and skin antiseptic. Stomach was inflated with air through the orogastric tube. The skin and subcutaneous tissues were anesthetized with 1% lidocaine. A 17 gauge needle was directed into the distended stomach with fluoroscopic guidance. A wire was advanced into the stomach and a T-tact was deployed. A 9-French vascular sheath was placed and the orogastric tube was snared using a Goos[place]ck snare device. The orogastric tube and snare were pulled out of the patient's mouth. The snare device was connected to a 20-French gastrostomy tube. The snare device and gastrostomy tube were pulled through the patient's mouth and out the anterior abdominal wall. The gastrostomy tube was cut to an appropriate length. Contrast injection through gastrostomy tube confirmed placement within the stomach. Fluoroscopic images were obtained for documentation. The gastrostomy tube was flushed with normal saline. IMPRESSION: Successful fluoroscopic guided percutaneous gastrostomy tube placement. Electronically Signed   By: Richarda OverlieAdam  Henn M.D.   On: 07/30/2018 14:57   Dg Chest Port 1 View  Result Date:  08/01/2018 CLINICAL DATA:  Acute respiratory failure EXAM: PORTABLE CHEST 1 VIEW COMPARISON:  Chest x-rays dated 07/31/2018 and 07/30/2018. FINDINGS: Tracheostomy appears appropriately positioned in the midline. RIGHT-sided PICC line  appears adequately position with tip at the level of the lower SVC. Heart size and mediastinal contours are stable. Lungs are clear. No pleural effusion or pneumothorax seen. IMPRESSION: No active disease. No evidence of pneumonia or pulmonary edema. Electronically Signed   By: Bary Richard M.D.   On: 08/01/2018 07:58   Dg Chest Port 1 View  Result Date: 07/31/2018 CLINICAL DATA:  Follow-up atelectasis EXAM: PORTABLE CHEST 1 VIEW COMPARISON:  07/30/2018 FINDINGS: Cardiac shadow is stable. Tracheostomy tube and right-sided PICC line are again noted and stable. The lungs are well aerated bilaterally. Previously seen density in the right base has improved consistent with resolving atelectasis. No new focal infiltrate is noted. There is a foreign body identified over the midportion of the chest below the left mainstem bronchus which has the appearance of a molar. Oblique or lateral imaging may be helpful for further evaluation. No other focal abnormality is noted. IMPRESSION: Tubes and lines as described above. Interval clearing of right basilar atelectasis. Foreign body that appears to be a molar in the midportion of the esophagus. This was not present on the most recent exam which was obtained 4 hours after a gastrostomy tube placement. Oblique or lateral imaging may be helpful for further evaluation. These results will be called to the ordering clinician or representative by the Radiologist Assistant, and communication documented in the PACS or zVision Dashboard. Electronically Signed   By: Alcide Clever M.D.   On: 07/31/2018 08:13   Dg Chest Port 1 View  Result Date: 07/30/2018 CLINICAL DATA:  72 year old female status post tracheostomy. EXAM: PORTABLE CHEST 1 VIEW COMPARISON:  Portable chest 07/29/2018. FINDINGS: Portable AP semi upright view at 1622 hours. Extubated. Tracheostomy tube in place and projects over the tracheal air column with no adverse features identified. Enteric tube has been removed.  Stable right PICC line. Stable lung volumes. Stable cardiac size and mediastinal contours. There is mild, vague increased medial right lung base opacity, but elsewhere Allowing for portable technique the lungs are clear. No pneumothorax. Negative visible bowel gas pattern. IMPRESSION: 1. Extubated and enteric tube removed, tracheostomy placed. No adverse features. 2. Stable lung volumes. Vague increased medial right lung base opacity. Atelectasis favored over aspiration or developing infection. Electronically Signed   By: Odessa Fleming M.D.   On: 07/30/2018 16:50     Assessment & Plan:   Acute Intraparenchymal and Intraventricular Hemorrhage associated w/ cerebral edema and uncal herniation.   11/28 - coma continues. Off 3% saline  Plan -per stroke MD and and neurosurgery following  Foreign body (tooth) possibly in airway 07/31/18 . Passed to D2 on 07/31/18 CT abd Plan - monitor passage  - repeat AXR - RN to inform family  Acute PE with b/l lower leg DVT. Plan - IVC filter placed per IR 11/25  Acute respiratory failure with compromised airway. Failure to wean s/p tracheostomy.  11/28 - failed SBT and back on full support Plan - trach care - wean to trach collar as tolerated - f/u CXR intermittently  Hypertension. Plan - continue norvasc, cozaar, lopressor  Community acquired pneumonia. E coli UTI. Plan - d/c Abx after dose on 11/27   Dysphagia. Plan - tube feeds via PEG  Hypokalemia Plan: - f/u BMET  Urinary retention Plan: - keep foley in for now  Best practice:  Diet: TF DVT prophylaxis: SCDs GI prophylaxis: pepcid Glucose control: SSI Mobility: bedrest Code Status: Full Code Family: No family at bedside 08/01/18 Disposition: family would like to arrange for transfer to [facility] in [place]   ATTESTATION & SIGNATURE   The patient Turquoise Esch is critically ill with multiple organ systems failure and requires high complexity decision making for  assessment and support, frequent evaluation and titration of therapies, application of advanced monitoring technologies and extensive interpretation of multiple databases.   Critical Care Time devoted to patient care services described in this note is  30  Minutes. This time reflects time of care of this signee Dr Kalman Shan. This critical care time does not reflect procedure time, or teaching time or supervisory time of PA/NP/Med student/Med Resident etc but could involve care discussion time     Dr. Kalman Shan, M.D., [facility].C.P Pulmonary and Critical Care Medicine Staff Physician [facility] [place] [facility] Pager: [phone], If no answer or between  15:00h - 7:00h: call [phone]  08/01/2018 9:15 AM

## 2018-08-01 NOTE — Progress Notes (Signed)
STROKE TEAM PROGRESS NOTE   SUBJECTIVE (INTERVAL HISTORY) Her RN is at the bedside.  Patient History already ventilatory weaning well and remains on pressure support 10 and PEEP of 5. 3% saline   Na 151. Exam unchanged. Still not following commands  .     OBJECTIVE Vitals:   08/01/18 1000 08/01/18 1100 08/01/18 1200 08/01/18 1300  BP: 126/63 105/61 94/77 108/76  Pulse: 96 91 90 89  Resp: (!) 22 (!) 23 18 17   Temp:   (!) 101.3 F (38.5 C)   TempSrc:   Axillary   SpO2: 100% 100% 100% 100%  Weight:      Height:        CBC:  Recent Labs  Lab 07/31/18 0427 08/01/18 0418  WBC 7.6 7.2  HGB 10.2* 9.0*  HCT 33.6* 30.0*  MCV 98.0 97.7  PLT 139* 144*    Basic Metabolic Panel:  Recent Labs  Lab 07/29/18 1738  07/31/18 0427  08/01/18 0031 08/01/18 0418  NA 157*   < > 151*   < > 152* 151*  K  --    < > 3.8  --   --  3.6  CL  --    < > 119*  --   --  122*  CO2  --    < > 27  --   --  24  GLUCOSE  --    < > 111*  --   --  169*  BUN  --    < > 15  --   --  14  CREATININE  --    < > 0.55  --   --  0.65  CALCIUM  --    < > 9.0  --   --  8.5*  MG 2.3  --  2.4  --   --   --   PHOS 3.8  --  3.0  --   --   --    < > = values in this interval not displayed.    Lipid Panel:     Component Value Date/Time   TRIG 16 07/26/2018 1331   HgbA1c:  Lab Results  Component Value Date   HGBA1C 5.7 (H) 07/25/2018   Urine Drug Screen: No results found for: LABOPIA, COCAINSCRNUR, LABBENZ, AMPHETMU, THCU, LABBARB  Alcohol Level No results found for: ETH  IMAGING   Ct Head Wo Contrast  Result Date: 07/26/2018 CLINICAL DATA:  72 y/o  F; acute hemorrhage for follow-up. EXAM: CT HEAD WITHOUT CONTRAST TECHNIQUE: Contiguous axial images were obtained from the base of the skull through the vertex without intravenous contrast. COMPARISON:  07/25/2018 CT head.  07/25/2018 MRI head. FINDINGS: Brain: Stable left frontal hemorrhage measuring 4.5 x 3.9 x 3.9 cm (AP x ML x CC series 3, image 14 and  series 5, image 16). Stable right anterior frontal hematoma measuring 21 mm (series 3, image 15). Stable left frontoparietal hemorrhage measuring 3.4 x 5.0 x 3.4 cm (AP x ML x CC series 5, image 45 and series 3, image 17). Stable 11 mm hemorrhage within the left lateral frontal lobe (series 3, image 17). Stable intraventricular hemorrhage pooling in the occipital horns of the lateral ventricles. Stable subarachnoid hemorrhage predominantly in the left greater than right sylvian fissures and along the right anterior frontal lobe. Stable thin subdural hematoma along the left tentorium cerebelli. Stable 10 mm left-to-right midline shift of the anterior septum pellucidum. No new stroke, hemorrhage, focal mass effect, or herniation. Vascular: Calcific atherosclerosis  of the carotid siphons. No hyperdense vessel identified. Skull: Normal. Negative for fracture or focal lesion. Sinuses/Orbits: No acute finding. Other: None. IMPRESSION: In comparison with the prior MRI of the head given differences in technique: 1. Stable acute hemorrhages within the left frontal lobe, right frontal lobe, left frontal parietal junction, and left lateral frontal lobe. 2. Stable intraventricular hemorrhage, thin subdural hematoma along the left tentorium, and small volume subarachnoid hemorrhage. 3. Stable 10 mm left-to-right midline shift of anterior septum pellucidum. 4. No new acute intracranial process identified. Electronically Signed   By: Mitzi Hansen M.D.   On: 07/26/2018 04:54   Mr Brain Wo Contrast  Result Date: 07/25/2018 CLINICAL DATA:  Acute mental status changes. Pulmonary embolus and DVT. EXAM: MRI HEAD WITHOUT CONTRAST TECHNIQUE: Multiplanar, multiecho pulse sequences of the brain and surrounding structures were obtained without intravenous contrast. COMPARISON:  CT head without contrast 07/25/2018 12:25 a.m. FINDINGS: Brain: Multifocal areas of acute hemorrhage are present. Anterior left frontal lobe  hemorrhage measures 4.0 x 4.5 x 4.3 Cm. A new hemorrhage in the posterior left frontal and left parietal lobe measures 5.5 x 3.0 x 3.5 cm. A smaller area of hemorrhage in the anterior inferior right frontal lobe measures 2.4 cm. Left right midline shift is 10 mm. There is effacement of the left lateral ventricle. Blood products are seen within the ventricles bilaterally. No hydrocephalus is evident. Posterior fossa is unremarkable. No acute parenchymal infarct is present otherwise. Vascular: Flow is present in the major intracranial arteries. Skull and upper cervical spine: Craniocervical junction is normal. Upper cervical spine is normal. Sinuses/Orbits: The left sphenoid sinus is partially opacified. No other significant paranasal sinus disease is present. Mastoid air cells are clear. Globes and orbits are within normal limits. IMPRESSION: 1. New large areas of hemorrhage in the anterior left frontal lobe and posterior left frontal and parietal lobe resulting in significant mass effect with left-to-right midline shift up to 10 mm. 2. Smaller area of hemorrhage in the anterior inferior right frontal lobe. 3. Intraventricular hemorrhage with fluid levels in the posterior horns of both lateral ventricles without hydrocephalus. Critical Value/emergent results were called by telephone at the time of interpretation on 07/25/2018 at 6:39 pm to Dr. Hartley Barefoot , who verbally acknowledged these results. Electronically Signed   By: Marin Roberts M.D.   On: 07/25/2018 19:01   Dg Chest Port 1 View  Result Date: 07/25/2018 CLINICAL DATA:  Status post intubation EXAM: PORTABLE CHEST 1 VIEW COMPARISON:  CT 07/25/2018 FINDINGS: Endotracheal tube with tip 4 cm from carina. Normal cardiac silhouette. Aorta is ectatic. Lungs are clear. IMPRESSION: Endotracheal tube in good position. Electronically Signed   By: Genevive Bi M.D.   On: 07/25/2018 20:49   Vas [place] Lower Extremity Venous (dvt)  Result Date:  07/25/2018  Lower Venous Study Indications: SOB, and pulmonary embolism.  Risk Factors: Confirmed PE. Performing Technologist: Toma Deiters RVS  Examination Guidelines: A complete evaluation includes B-mode imaging, spectral Doppler, color Doppler, and power Doppler as needed of all accessible portions of each vessel. Bilateral testing is considered an integral part of a complete examination. Limited examinations for reoccurring indications may be performed as noted.  Right Venous Findings: +---------+---------------+---------+-----------+----------+------------------+          CompressibilityPhasicitySpontaneityPropertiesSummary            +---------+---------------+---------+-----------+----------+------------------+ CFV      Full           Yes      Yes                                     +---------+---------------+---------+-----------+----------+------------------+  SFJ      Full                                                            +---------+---------------+---------+-----------+----------+------------------+ FV Prox  Full           Yes      Yes                                     +---------+---------------+---------+-----------+----------+------------------+ FV Mid   Full                                                            +---------+---------------+---------+-----------+----------+------------------+ FV DistalFull           Yes      Yes                                     +---------+---------------+---------+-----------+----------+------------------+ PFV      Full           Yes      Yes                                     +---------+---------------+---------+-----------+----------+------------------+ POP      Partial        Yes      Yes                                     +---------+---------------+---------+-----------+----------+------------------+ PTV      Partial                                      Acute mid to                                                              proximal           +---------+---------------+---------+-----------+----------+------------------+ PERO                                                  Difficult to                                                             visualize          +---------+---------------+---------+-----------+----------+------------------+  Gastroc  Partial                                      Acute              +---------+---------------+---------+-----------+----------+------------------+  Right Technical Findings: Difficult to position the leg for distal imaging due to dementia and patient poor to repond.  Left Venous Findings: +---------+---------------+---------+-----------+----------+-------------+          CompressibilityPhasicitySpontaneityPropertiesSummary       +---------+---------------+---------+-----------+----------+-------------+ CFV      Full           Yes      Yes                                +---------+---------------+---------+-----------+----------+-------------+ SFJ      Full                                                       +---------+---------------+---------+-----------+----------+-------------+ FV Prox  Full           Yes      Yes                                +---------+---------------+---------+-----------+----------+-------------+ FV Mid   Full                                                       +---------+---------------+---------+-----------+----------+-------------+ FV DistalFull           Yes      Yes                                +---------+---------------+---------+-----------+----------+-------------+ PFV      Full           Yes      Yes                                +---------+---------------+---------+-----------+----------+-------------+ POP      Partial                                      Mid to distal  +---------+---------------+---------+-----------+----------+-------------+ PTV      None                                                       +---------+---------------+---------+-----------+----------+-------------+ PERO     None                                                       +---------+---------------+---------+-----------+----------+-------------+  Left Technical Findings: Difficult to positin the lef for distal imaging due to dementia and patient poorto respond. Rouleux flow was noted in the proxiaml popliteal vein.   Summary: Right: Findings consistent with acute deep vein thrombosis involving the right gastrocnemius vein, right posterior tibial vein, and right popliteal vein. See technical findings listed above Left: Findings consistent with acute deep vein thrombosis involving the left peroneal vein, left posterior tibial vein, and left popliteal vein. See technical findings listed above  *See table(s) above for measurements and observations. Electronically signed by Sherald Hess MD on 07/25/2018 at 3:36:49 PM.    Final     Transthoracic Echocardiogram  Normal LV size with EF 65-70%. Normal RV size and systolic   function. Flattened mitral valve closure plane without frank   prolapse, mild to moderate mitral regurgitation. Mild pulmonary   hypertension.  Ct Head Wo Contrast  Result Date: 07/28/2018 CLINICAL DATA:  Follow-up of intracranial hemorrhage EXAM: CT HEAD WITHOUT CONTRAST TECHNIQUE: Contiguous axial images were obtained from the base of the skull through the vertex without intravenous contrast. COMPARISON:  None. FINDINGS: Brain: There is multifocal intraparenchymal hemorrhage redemonstrated throughout the brain. The largest hematomas, in the left frontal lobe and at the left frontoparietal junction, are unchanged. The right frontal pole hematoma is also unchanged. Subarachnoid blood over the left convexity and in the right sylvian fissure is unchanged. The  amount of intraventricular blood is unchanged. There is rightward midline shift that measures 6 mm, unchanged. Vascular: No abnormal hyperdensity of the major intracranial arteries or dural venous sinuses. No intracranial atherosclerosis. Skull: The visualized skull base, calvarium and extracranial soft tissues are normal. Sinuses/Orbits: No fluid levels or advanced mucosal thickening of the visualized paranasal sinuses. No mastoid or middle ear effusion. The orbits are normal. IMPRESSION: Unchanged appearance of multifocal intraparenchymal and extra-axial hemorrhage throughout the brain, with unchanged 6 mm rightward midline shift. Electronically Signed   By: Deatra Robinson M.D.   On: 07/28/2018 04:42    PHYSICAL EXAM  Temp:  [98.3 F (36.8 C)-101.3 F (38.5 C)] 101.3 F (38.5 C) (11/28 1200) Pulse Rate:  [82-110] 89 (11/28 1300) Resp:  [11-33] 17 (11/28 1300) BP: (90-174)/(51-99) 108/76 (11/28 1300) SpO2:  [96 %-100 %] 100 % (11/28 1300) FiO2 (%):  [30 %] 30 % (11/28 0918) Weight:  [74.9 kg] 74.9 kg (11/28 0200)  General -elderly african [place] lady , s/p tracheostomy not on sedation.  Ophthalmologic - fundi not visualized due to noncooperation.  Cardiovascular - Regular rhythm, no murmur or gallop  Neuro - s/p trach on ventilator, eyes closed, not open on voice but slightly open on pain stimulation.  Not following any commands.  Left pupil 4 mm, brisk to light, right pupil 2.5 mm, brisk to light.  Left gaze preference, no doll's eyes, bilateral corneal weak reflexes.  Gag and cough reflex present. LUE and LLE spontaneous movement noted intermittently.On pain summation LUE withdraw and localizing to pain, RUE trace withdraw.  LLE withdraws to pain   but RLE trace withdraw minimal..  DTR 1+, bilateral Babinski positive. Sensation, coordination and gait not tested.   ASSESSMENT/PLAN Ms. Hannah Holloway is a 72 y.o. female with history of dementia presenting with generalized weakness, found  down at home, soaked in urine.  She was admitted to [facility] and found to have bilateral PE on chest CT and bilateral DVT and lower extremities.  She was put on heparin IV but then developed worsening mental status for which MRI was done  showed left frontal anterior and posterior ICH with small right frontal ICH and IVH.  Patient transferred to [facility] for further evaluation.   ICH: Left anterior and posterior frontal large hematoma, right anterior frontal small hematoma, and IVH bilaterally.  Heparin was reversed with protamine.  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  Resultant unresponsive, intubated  CT head 07/25/2018 no acute abnormality  MRI head showed left anterior and posterior frontal large hematoma, right anterior frontal small hematoma and IVH. However, SWI sequence was not able to obtain due to noncooperation.  CT head repeat 07/26/18 confirmed ICH and IVH as above  CT repeat 08/27/18 stable hematoma and improved MLS  Neurosurgery consulted, Dr. Venetia Maxon recommended against surgical intervention.  2D Echo - EF 65 to 70%  HgbA1c 5.7  VTE prophylaxis - SCDs  No antithrombotic prior to admission, was treated with heparin IV for PE/DVT, now on No antithrombotic given ICH.  Ongoing aggressive stroke risk factor management  Therapy recommendations:  pending  Disposition:  Pending Poor prognosis and son has transferred POA to pt brother Zola Button MD who is neurosurgeon in [place]. Pt another brother Myrtie Neither MD was orthopedics in [place].   Cerebral edema and uncal herniation  CT and MRI showed midline shift 10 mm  anisocoria with L>R, but papillary reflex present bilaterally  CT repeat stable hematoma but improved MLS  Mannitol 1 dose given on admission  Currently off 3% saline  Pt has PICC line  Sodium  144->153->160->157->158  Sodium goal 150 - 160  Sodium check every 6 hours  On tube feeding   Bilateral PE and DVT  Patient found down at home, soaked in urine  Chest CT showed bilateral upper lobe PE  LE venous Doppler showed bilateral DVT  Was on heparin IV which has been discontinued and reversed due to ICH  Respiratory distress  Intubated for not protecting airway  On ventilation  CCM on board  Sepsis  CT chest showed multifocal pneumonia  UA WBC 21-50  Urine culture showed gram-negative rods  Leukocytosis with WBC 11.1->11.0->8.5  Currently on azithromycin, Rocephin  Overnight Tmax 100.1  Hypertension  Stable on cleviprex . BP goal < 140 . Wean off cleviprex as able . Now on po meds with amlodipine, losartan and metoprolol . Long-term BP goal normotensive  Dysphagia   Did not pass swallow  Has OG tube  Will start TF today  Other Stroke Risk Factors  Advanced age  Other Active Problems  Baseline dementia    Hospital day # 7 Plan    Continue weaning ventilatory support on tracheostomy. . Long discussion with Dr. Marchelle Gearing and answered questions..Family wants transfer to [facility] in [place] later. Continue to hold hypertonic saline till Na falls below 150. This patient is critically ill due to extensive ICH, IVH, bilateral PE/DVT, cerebral edema and uncal herniation, sepsis with pneumonia and UTI and at significant risk of neurological worsening, death form uncal herniation, cerebral edema, heart failure, respiratory failure, seizure and sepsis as well as brain death. This patient's care requires constant monitoring of vital signs, hemodynamics, respiratory and cardiac monitoring, review of multiple databases, neurological assessment, discussion with family, other specialists and medical decision making of high complexity. I spent 35 minutes of neurocritical care time in the care of this patient.    Delia Heady, MD Stroke  Neurology 08/01/2018 1:08 PM     To  contact Stroke Continuity provider, please refer to [email or web address] After hours, contact General Neurology

## 2018-08-02 LAB — CBC WITH DIFFERENTIAL/PLATELET
Abs Immature Granulocytes: 0.04 10*3/uL (ref 0.00–0.07)
BASOS ABS: 0 10*3/uL (ref 0.0–0.1)
Basophils Relative: 0 %
Eosinophils Absolute: 0.1 10*3/uL (ref 0.0–0.5)
Eosinophils Relative: 1 %
HCT: 29.6 % — ABNORMAL LOW (ref 36.0–46.0)
Hemoglobin: 8.7 g/dL — ABNORMAL LOW (ref 12.0–15.0)
Immature Granulocytes: 1 %
Lymphocytes Relative: 11 %
Lymphs Abs: 0.8 10*3/uL (ref 0.7–4.0)
MCH: 29 pg (ref 26.0–34.0)
MCHC: 29.4 g/dL — ABNORMAL LOW (ref 30.0–36.0)
MCV: 98.7 fL (ref 80.0–100.0)
Monocytes Absolute: 0.6 10*3/uL (ref 0.1–1.0)
Monocytes Relative: 8 %
NRBC: 0 % (ref 0.0–0.2)
Neutro Abs: 6.1 10*3/uL (ref 1.7–7.7)
Neutrophils Relative %: 79 %
Platelets: 148 10*3/uL — ABNORMAL LOW (ref 150–400)
RBC: 3 MIL/uL — ABNORMAL LOW (ref 3.87–5.11)
RDW: 15.5 % (ref 11.5–15.5)
WBC: 7.6 10*3/uL (ref 4.0–10.5)

## 2018-08-02 LAB — GLUCOSE, CAPILLARY
Glucose-Capillary: 115 mg/dL — ABNORMAL HIGH (ref 70–99)
Glucose-Capillary: 120 mg/dL — ABNORMAL HIGH (ref 70–99)
Glucose-Capillary: 123 mg/dL — ABNORMAL HIGH (ref 70–99)
Glucose-Capillary: 128 mg/dL — ABNORMAL HIGH (ref 70–99)

## 2018-08-02 LAB — BASIC METABOLIC PANEL
Anion gap: 3 — ABNORMAL LOW (ref 5–15)
BUN: 16 mg/dL (ref 8–23)
CO2: 24 mmol/L (ref 22–32)
Calcium: 8.8 mg/dL — ABNORMAL LOW (ref 8.9–10.3)
Chloride: 120 mmol/L — ABNORMAL HIGH (ref 98–111)
Creatinine, Ser: 0.62 mg/dL (ref 0.44–1.00)
GFR calc non Af Amer: >60 mL/min (ref >60)
Glucose, Bld: 126 mg/dL — ABNORMAL HIGH (ref 70–99)
Potassium: 4 mmol/L (ref 3.5–5.1)
SODIUM: 147 mmol/L — AB (ref 135–145)

## 2018-08-02 LAB — MAGNESIUM: Magnesium: 2.1 mg/dL (ref 1.7–2.4)

## 2018-08-02 LAB — PHOSPHORUS: PHOSPHORUS: 3 mg/dL (ref 2.5–4.6)

## 2018-08-02 NOTE — Progress Notes (Signed)
SLP Cancellation Note  Patient Details Name: Hannah Holloway MRN: [id number] DOB: 1945-10-23   Cancelled treatment:       Reason Eval/Treat Not Completed: Patient not medically ready. Still on ventilator support, not following commands.    Vania Rosero, Riley NearingBonnie Caroline 08/02/2018, 7:36 AM

## 2018-08-02 NOTE — Progress Notes (Signed)
STROKE TEAM PROGRESS NOTE   SUBJECTIVE (INTERVAL HISTORY) Her RN is at the bedside.  Logically unchanged. Remains very poorly responsive and barely open eyes. Not following commands. Has semi purposeful movements on the left.Serum sodium is not down to 147 OBJECTIVE Vitals:   08/02/18 1000 08/02/18 1100 08/02/18 1131 08/02/18 1200  BP: 134/70 124/82  115/71  Pulse: 86 83  82  Resp: (!) 21 15  18   Temp:      TempSrc:      SpO2: 100% 100% 100% 100%  Weight:      Height:        CBC:  Recent Labs  Lab 08/01/18 0418 08/02/18 0347  WBC 7.2 7.6  NEUTROABS  --  6.1  HGB 9.0* 8.7*  HCT 30.0* 29.6*  MCV 97.7 98.7  PLT 144* 148*    Basic Metabolic Panel:  Recent Labs  Lab 07/31/18 0427  08/01/18 0418  08/01/18 1850 08/02/18 0347  NA 151*   < > 151*   < > 152* 147*  K 3.8  --  3.6  --   --  4.0  CL 119*  --  122*  --   --  120*  CO2 27  --  24  --   --  24  GLUCOSE 111*  --  169*  --   --  126*  BUN 15  --  14  --   --  16  CREATININE 0.55  --  0.65  --   --  0.62  CALCIUM 9.0  --  8.5*  --   --  8.8*  MG 2.4  --   --   --   --  2.1  PHOS 3.0  --   --   --   --  3.0   < > = values in this interval not displayed.    Lipid Panel:     Component Value Date/Time   TRIG 16 07/26/2018 1331   HgbA1c:  Lab Results  Component Value Date   HGBA1C 5.7 (H) 07/25/2018   Urine Drug Screen: No results found for: LABOPIA, COCAINSCRNUR, LABBENZ, AMPHETMU, THCU, LABBARB  Alcohol Level No results found for: ETH  IMAGING   Ct Head Wo Contrast  Result Date: 07/26/2018 CLINICAL DATA:  72 y/o  F; acute hemorrhage for follow-up. EXAM: CT HEAD WITHOUT CONTRAST TECHNIQUE: Contiguous axial images were obtained from the base of the skull through the vertex without intravenous contrast. COMPARISON:  07/25/2018 CT head.  07/25/2018 MRI head. FINDINGS: Brain: Stable left frontal hemorrhage measuring 4.5 x 3.9 x 3.9 cm (AP x ML x CC series 3, image 14 and series 5, image 16). Stable right  anterior frontal hematoma measuring 21 mm (series 3, image 15). Stable left frontoparietal hemorrhage measuring 3.4 x 5.0 x 3.4 cm (AP x ML x CC series 5, image 45 and series 3, image 17). Stable 11 mm hemorrhage within the left lateral frontal lobe (series 3, image 17). Stable intraventricular hemorrhage pooling in the occipital horns of the lateral ventricles. Stable subarachnoid hemorrhage predominantly in the left greater than right sylvian fissures and along the right anterior frontal lobe. Stable thin subdural hematoma along the left tentorium cerebelli. Stable 10 mm left-to-right midline shift of the anterior septum pellucidum. No new stroke, hemorrhage, focal mass effect, or herniation. Vascular: Calcific atherosclerosis of the carotid siphons. No hyperdense vessel identified. Skull: Normal. Negative for fracture or focal lesion. Sinuses/Orbits: No acute finding. Other: None. IMPRESSION: In comparison with the prior  MRI of the head given differences in technique: 1. Stable acute hemorrhages within the left frontal lobe, right frontal lobe, left frontal parietal junction, and left lateral frontal lobe. 2. Stable intraventricular hemorrhage, thin subdural hematoma along the left tentorium, and small volume subarachnoid hemorrhage. 3. Stable 10 mm left-to-right midline shift of anterior septum pellucidum. 4. No new acute intracranial process identified. Electronically Signed   By: Mitzi Hansen M.D.   On: 07/26/2018 04:54   Mr Brain Wo Contrast  Result Date: 07/25/2018 CLINICAL DATA:  Acute mental status changes. Pulmonary embolus and DVT. EXAM: MRI HEAD WITHOUT CONTRAST TECHNIQUE: Multiplanar, multiecho pulse sequences of the brain and surrounding structures were obtained without intravenous contrast. COMPARISON:  CT head without contrast 07/25/2018 12:25 a.m. FINDINGS: Brain: Multifocal areas of acute hemorrhage are present. Anterior left frontal lobe hemorrhage measures 4.0 x 4.5 x 4.3 Cm. A  new hemorrhage in the posterior left frontal and left parietal lobe measures 5.5 x 3.0 x 3.5 cm. A smaller area of hemorrhage in the anterior inferior right frontal lobe measures 2.4 cm. Left right midline shift is 10 mm. There is effacement of the left lateral ventricle. Blood products are seen within the ventricles bilaterally. No hydrocephalus is evident. Posterior fossa is unremarkable. No acute parenchymal infarct is present otherwise. Vascular: Flow is present in the major intracranial arteries. Skull and upper cervical spine: Craniocervical junction is normal. Upper cervical spine is normal. Sinuses/Orbits: The left sphenoid sinus is partially opacified. No other significant paranasal sinus disease is present. Mastoid air cells are clear. Globes and orbits are within normal limits. IMPRESSION: 1. New large areas of hemorrhage in the anterior left frontal lobe and posterior left frontal and parietal lobe resulting in significant mass effect with left-to-right midline shift up to 10 mm. 2. Smaller area of hemorrhage in the anterior inferior right frontal lobe. 3. Intraventricular hemorrhage with fluid levels in the posterior horns of both lateral ventricles without hydrocephalus. Critical Value/emergent results were called by telephone at the time of interpretation on 07/25/2018 at 6:39 pm to Dr. Hartley Barefoot , who verbally acknowledged these results. Electronically Signed   By: Marin Roberts M.D.   On: 07/25/2018 19:01   Dg Chest Port 1 View  Result Date: 07/25/2018 CLINICAL DATA:  Status post intubation EXAM: PORTABLE CHEST 1 VIEW COMPARISON:  CT 07/25/2018 FINDINGS: Endotracheal tube with tip 4 cm from carina. Normal cardiac silhouette. Aorta is ectatic. Lungs are clear. IMPRESSION: Endotracheal tube in good position. Electronically Signed   By: Genevive Bi M.D.   On: 07/25/2018 20:49   Vas [place] Lower Extremity Venous (dvt)  Result Date: 07/25/2018  Lower Venous Study Indications: SOB,  and pulmonary embolism.  Risk Factors: Confirmed PE. Performing Technologist: Toma Deiters RVS  Examination Guidelines: A complete evaluation includes B-mode imaging, spectral Doppler, color Doppler, and power Doppler as needed of all accessible portions of each vessel. Bilateral testing is considered an integral part of a complete examination. Limited examinations for reoccurring indications may be performed as noted.  Right Venous Findings: +---------+---------------+---------+-----------+----------+------------------+          CompressibilityPhasicitySpontaneityPropertiesSummary            +---------+---------------+---------+-----------+----------+------------------+ CFV      Full           Yes      Yes                                     +---------+---------------+---------+-----------+----------+------------------+  SFJ      Full                                                            +---------+---------------+---------+-----------+----------+------------------+ FV Prox  Full           Yes      Yes                                     +---------+---------------+---------+-----------+----------+------------------+ FV Mid   Full                                                            +---------+---------------+---------+-----------+----------+------------------+ FV DistalFull           Yes      Yes                                     +---------+---------------+---------+-----------+----------+------------------+ PFV      Full           Yes      Yes                                     +---------+---------------+---------+-----------+----------+------------------+ POP      Partial        Yes      Yes                                     +---------+---------------+---------+-----------+----------+------------------+ PTV      Partial                                      Acute mid to                                                              proximal           +---------+---------------+---------+-----------+----------+------------------+ PERO                                                  Difficult to                                                             visualize          +---------+---------------+---------+-----------+----------+------------------+  Gastroc  Partial                                      Acute              +---------+---------------+---------+-----------+----------+------------------+  Right Technical Findings: Difficult to position the leg for distal imaging due to dementia and patient poor to repond.  Left Venous Findings: +---------+---------------+---------+-----------+----------+-------------+          CompressibilityPhasicitySpontaneityPropertiesSummary       +---------+---------------+---------+-----------+----------+-------------+ CFV      Full           Yes      Yes                                +---------+---------------+---------+-----------+----------+-------------+ SFJ      Full                                                       +---------+---------------+---------+-----------+----------+-------------+ FV Prox  Full           Yes      Yes                                +---------+---------------+---------+-----------+----------+-------------+ FV Mid   Full                                                       +---------+---------------+---------+-----------+----------+-------------+ FV DistalFull           Yes      Yes                                +---------+---------------+---------+-----------+----------+-------------+ PFV      Full           Yes      Yes                                +---------+---------------+---------+-----------+----------+-------------+ POP      Partial                                      Mid to distal +---------+---------------+---------+-----------+----------+-------------+ PTV      None                                                        +---------+---------------+---------+-----------+----------+-------------+ PERO     None                                                       +---------+---------------+---------+-----------+----------+-------------+  Left Technical Findings: Difficult to positin the lef for distal imaging due to dementia and patient poorto respond. Rouleux flow was noted in the proxiaml popliteal vein.   Summary: Right: Findings consistent with acute deep vein thrombosis involving the right gastrocnemius vein, right posterior tibial vein, and right popliteal vein. See technical findings listed above Left: Findings consistent with acute deep vein thrombosis involving the left peroneal vein, left posterior tibial vein, and left popliteal vein. See technical findings listed above  *See table(s) above for measurements and observations. Electronically signed by Sherald Hess MD on 07/25/2018 at 3:36:49 PM.    Final     Transthoracic Echocardiogram  Normal LV size with EF 65-70%. Normal RV size and systolic   function. Flattened mitral valve closure plane without frank   prolapse, mild to moderate mitral regurgitation. Mild pulmonary   hypertension.  Ct Head Wo Contrast  Result Date: 07/28/2018 CLINICAL DATA:  Follow-up of intracranial hemorrhage EXAM: CT HEAD WITHOUT CONTRAST TECHNIQUE: Contiguous axial images were obtained from the base of the skull through the vertex without intravenous contrast. COMPARISON:  None. FINDINGS: Brain: There is multifocal intraparenchymal hemorrhage redemonstrated throughout the brain. The largest hematomas, in the left frontal lobe and at the left frontoparietal junction, are unchanged. The right frontal pole hematoma is also unchanged. Subarachnoid blood over the left convexity and in the right sylvian fissure is unchanged. The amount of intraventricular blood is unchanged. There is rightward midline shift that measures 6 mm,  unchanged. Vascular: No abnormal hyperdensity of the major intracranial arteries or dural venous sinuses. No intracranial atherosclerosis. Skull: The visualized skull base, calvarium and extracranial soft tissues are normal. Sinuses/Orbits: No fluid levels or advanced mucosal thickening of the visualized paranasal sinuses. No mastoid or middle ear effusion. The orbits are normal. IMPRESSION: Unchanged appearance of multifocal intraparenchymal and extra-axial hemorrhage throughout the brain, with unchanged 6 mm rightward midline shift. Electronically Signed   By: Deatra Robinson M.D.   On: 07/28/2018 04:42    PHYSICAL EXAM  Temp:  [99.4 F (37.4 C)-101.6 F (38.7 C)] 99.6 F (37.6 C) (11/29 0846) Pulse Rate:  [82-99] 82 (11/29 1200) Resp:  [14-34] 18 (11/29 1200) BP: (85-153)/(55-116) 115/71 (11/29 1200) SpO2:  [100 %] 100 % (11/29 1200) FiO2 (%):  [30 %] 30 % (11/29 1131) Weight:  [75.1 kg] 75.1 kg (11/29 0350)  General -elderly african american lady , s/p tracheostomy not on sedation.  Ophthalmologic - fundi not visualized due to noncooperation.  Cardiovascular - Regular rhythm, no murmur or gallop  Neuro - s/p trach on ventilator, eyes closed, not open on voice but slightly open on pain stimulation.  Not following any commands.  Left pupil 4 mm, brisk to light, right pupil 2.5 mm, brisk to light.  Left gaze preference, no doll's eyes, bilateral corneal weak reflexes.  Gag and cough reflex present. LUE and LLE spontaneous movement noted intermittently.On sternal rub LUE withdraw and localizing to pain, RUE trace withdraw.  LLE withdraws to pain   but RLE trace withdraw minimal..  DTR 1+, bilateral Babinski positive. Sensation, coordination and gait not tested.   ASSESSMENT/PLAN Hannah Holloway is a 72 y.o. female with history of dementia presenting with generalized weakness, found down at home, soaked in urine.  She was admitted to [facility] and found to have bilateral PE on  chest CT and bilateral DVT and lower extremities.  She was put on heparin IV but then developed worsening mental status for which MRI was done showed  left frontal anterior and posterior ICH with small right frontal ICH and IVH.  Patient transferred to [facility] for further evaluation.   ICH: Left anterior and posterior frontal large hematoma, right anterior frontal small hematoma, and IVH bilaterally.  Heparin was reversed with protamine.  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  Resultant unresponsive, intubated  CT head 07/25/2018 no acute abnormality  MRI head showed left anterior and posterior frontal large hematoma, right anterior frontal small hematoma and IVH. However, SWI sequence was not able to obtain due to noncooperation.  CT head repeat 07/26/18 confirmed ICH and IVH as above  CT repeat 08/27/18 stable hematoma and improved MLS  Neurosurgery consulted, Dr. Venetia Maxon recommended against surgical intervention.  2D Echo - EF 65 to 70%  HgbA1c 5.7  VTE prophylaxis - SCDs  No antithrombotic prior to admission, was treated with heparin IV for PE/DVT, now on No antithrombotic given ICH.  Ongoing aggressive stroke risk factor management  Therapy recommendations:  pending  Disposition:  Pending Poor prognosis and son has transferred POA to pt brother Hannah Button MD who is neurosurgeon in [place]. Pt another brother Hannah Neither MD was orthopedics in [place].   Cerebral edema and uncal herniation  CT and MRI showed midline shift 10 mm  anisocoria with L>R, but papillary reflex present bilaterally  CT repeat stable hematoma but improved MLS  Mannitol 1 dose given on admission  Currently off 3% saline  Pt has PICC line  Sodium 144->153->160->157->158  Sodium goal 150 - 160  Sodium check every 6 hours  On tube feeding   Bilateral PE and  DVT  Patient found down at home, soaked in urine  Chest CT showed bilateral upper lobe PE  LE venous Doppler showed bilateral DVT  Was on heparin IV which has been discontinued and reversed due to ICH  Respiratory distress  Intubated for not protecting airway  On ventilation  CCM on board  Sepsis  CT chest showed multifocal pneumonia  UA WBC 21-50  Urine culture showed gram-negative rods  Leukocytosis with WBC 11.1->11.0->8.5  Currently on azithromycin, Rocephin  Overnight Tmax 100.1  Hypertension  Stable on cleviprex . BP goal < 140 . Wean off cleviprex as able . Now on po meds with amlodipine, losartan and metoprolol . Long-term BP goal normotensive  Dysphagia   Did not pass swallow  Has OG tube  Will start TF today  Other Stroke Risk Factors  Advanced age  Other Active Problems  Baseline dementia    Hospital day # 8 Plan    Continue weaning ventilatory support on tracheostomy. . Long discussion with Dr.Sood and answered questions..Family wants transfer to [facility] in [place] later. Will discontinue hypertonic saline as it is day 9 today after her hemorrhage and expect cytotoxic edema to the resolving now.This patient is critically ill due to extensive ICH, IVH, bilateral PE/DVT, cerebral edema and uncal herniation, sepsis with pneumonia and UTI and at significant risk of neurological worsening, death form uncal herniation, cerebral edema, heart failure, respiratory failure, seizure and sepsis as well as brain death. This patient's care requires constant monitoring of vital signs, hemodynamics, respiratory and cardiac monitoring, review of multiple databases, neurological assessment, discussion with family, other specialists and medical decision making of high complexity. I spent 30 minutes of neurocritical care time in the care of this patient.    Delia Heady, MD Stroke Neurology 08/02/2018  1:05 PM     To contact Stroke Continuity provider,  please refer to [email or web address]. After hours, contact General Neurology

## 2018-08-02 NOTE — Progress Notes (Signed)
..   NAME:  Hannah Holloway, MRN:  [id number], DOB:  May 29, 1946, LOS: 8 ADMISSION DATE:  07/24/2018, CONSULTATION DATE:  07/25/18 REFERRING MD:  Sunnie NielsenEGALADO, CHIEF COMPLAINT:  Altered mental status   Brief History   72 yr old female who was admitted on 07/24/18 for mgmt of Acute PE secondary to lower ext thrombosis from immobility started on Heparin gtt for anticoagulation. Experienced acute change in mental status while in MRI where pt was found to have a large left sided IPH and IVH with midline shift 10 mm.  Past Medical History  Vitamin D deficiency  Significant Hospital Events   11/20 Acute PE and b/l lower leg DVTs 11/21 New ICH, ETT 11/25 IVC filter placed by IR; off cleviprex 11/26 trach  Consults:  Neurology:11/21 Neurosurgery: 11/21  Procedures:  ETT 11/21 >> 11/26 PICC 11/23 >>  Trach 11/26 >> PEG 11/26 >>   Significant Diagnostic Tests:  CT angio chest 11/21 >> upper lobe PE, multifocal infiltrates, coronary calcification Dopper legs 11/21 >> acute deep vein thrombosis involving the right gastrocnemius vein, right posterior tibial vein, and right popliteal vein; acute deep vein thrombosis involving the left peroneal vein, left posterior tibial vein, and left popliteal vein.  MRI brain 11/21 >> anterior Lt frontal lobe and posterior Lt frontal and parietal lobe hemorrhage with 10 mm shift, intraventricular hemorrhage w/o hydrocephalus CT chest 11/24 >> midline shift 6 mm   Micro Data:  Blood 11/20 >> negative Urine 11/21 >> E coli  Antimicrobials:  Rocephin 11/21 >> 11/27 Azithromycin 11/21 >> 11/27  Interim history/subjective:  Remains on vent.  Objective   Blood pressure 138/73, pulse 88, temperature 99.4 F (37.4 C), temperature source Axillary, resp. rate 16, height 5\' 6"  (1.676 m), weight 75.1 kg, SpO2 100 %.    Vent Mode: PRVC FiO2 (%):  [30 %] 30 % Set Rate:  [12 bmp] 12 bmp Vt Set:  [470 mL] 470 mL PEEP:  [5 cmH20] 5 cmH20 Pressure Support:  [10  cmH20] 10 cmH20 Plateau Pressure:  [12 cmH20-16 cmH20] 15 cmH20   Intake/Output Summary (Last 24 hours) at 08/02/2018 0810 Last data filed at 08/02/2018 0700 Gross per 24 hour  Intake 2252.32 ml  Output 2005 ml  Net 247.32 ml   Filed Weights   07/31/18 0500 08/01/18 0200 08/02/18 0350  Weight: 80 kg 74.9 kg 75.1 kg    General - on vent Eyes - pupils reactive ENT - trach site clean Cardiac - regular rate/rhythm, no murmur Chest - equal breath sounds b/l, no wheezing or rales Abdomen - soft, non tender, + bowel sounds, PEG in place Extremities - 1+ edema Skin - no rashes Neuro - opens eyes spontaneously, not following commands   Assessment & Plan:   Acute Intraparenchymal and Intraventricular Hemorrhage associated w/ cerebral edema and uncal herniation.  Plan - per neurology and neurosurgery  Foreign body (tooth) possibly in GI tract. Plan - seen by GI >> allow to pass in stool  Acute PE with b/l lower leg DVT. Plan - IVC filter placed per IR 11/25  Acute respiratory failure with compromised airway. Failure to wean s/p tracheostomy. Plan - pressure support wean as able - trach care - f/u CXR intermittently - d/c trach sutures on 08/05/18  Hypertension. Plan - norvasc, cozaar, lopressor  Community acquired pneumonia. E coli UTI. Plan - completed ABx 11/27  Dysphagia. Plan - tube feeds  Hypokalemia Plan: - f/u BMET  Urinary retention Plan: - keep foley in for now  Best practice:  Diet: TF DVT prophylaxis: SCDs GI prophylaxis: pepcid Glucose control: SSI Mobility: bedrest Code Status: Full Code Family: No family at bedside Disposition: family would like to arrange for transfer to LTAC in [place]   Labs:   CMP Latest Ref Rng & Units 08/02/2018 08/01/2018 08/01/2018  Glucose 70 - 99 mg/dL 469(G) - -  BUN 8 - 23 mg/dL 16 - -  Creatinine 2.95 - 1.00 mg/dL 2.84 - -  Sodium 132 - 145 mmol/L 147(H) 152(H) 152(H)  Potassium 3.5 - 5.1  mmol/L 4.0 - -  Chloride 98 - 111 mmol/L 120(H) - -  CO2 22 - 32 mmol/L 24 - -  Calcium 8.9 - 10.3 mg/dL 4.4(W) - -  Total Protein 6.5 - 8.1 g/dL - - -  Total Bilirubin 0.3 - 1.2 mg/dL - - -  Alkaline Phos 38 - 126 U/L - - -  AST 15 - 41 U/L - - -  ALT 0 - 44 U/L - - -   CBC Latest Ref Rng & Units 08/02/2018 08/01/2018 07/31/2018  WBC 4.0 - 10.5 K/uL 7.6 7.2 7.6  Hemoglobin 12.0 - 15.0 g/dL 1.0(U) 7.2(Z) 10.2(L)  Hematocrit 36.0 - 46.0 % 29.6(L) 30.0(L) 33.6(L)  Platelets 150 - 400 K/uL 148(L) 144(L) 139(L)   CBG (last 3)  Recent Labs    08/01/18 2340 08/02/18 0345 08/02/18 0734  GLUCAP 121* 115* 123*   ABG    Component Value Date/Time   PHART 7.506 (H) 07/26/2018 1334   PCO2ART 21.3 (L) 07/26/2018 1334   PO2ART 116.0 (H) 07/26/2018 1334   HCO3 16.8 (L) 07/26/2018 1334   TCO2 17 (L) 07/26/2018 1334   ACIDBASEDEF 6.0 (H) 07/26/2018 1334   O2SAT 99.0 07/26/2018 1334    Coralyn Helling, MD [facility] 08/02/2018, 8:15 AM

## 2018-08-02 NOTE — Progress Notes (Signed)
Physical Therapy Treatment Patient Details Name: Hannah Holloway MRN: [id number] DOB: [birth year] Today's Date: 08/02/2018    History of Present Illness Hannah Holloway is a 72 y.o. female with history of dementia who was found down at home. She was admitted to [facility] and found to have bilateral PE on chest CT and bilateral DVT and lower extremities. She was put on heparin IV but then developed worsening mental status. Neuro imaging showing Intraventricular hemorrhage, and large areas of hemorrhage in the anterior left frontal lobe and posterior left frontal and parietal lobe resulting in significant mass effect with left-to-right midline shift up to 10 mm. Family interested in pursuing medical and rehab efforts; for IVC filter and gastrostomy tube by IR.  has no past medical history on file.    PT Comments    Pt lethargic with eyes closed on arrival. Provided washcloth to wipe pts face and assist to open eyes but pt not maintaining eyes open until positioned in full chair. Pt with eyes open with left gaze preference and no noted tracking. Pt with purposeful movement of her head to remove washcloth from face, noxious response of bil LE but no other intentional movement or command following throughout session. Pt in semichair end of session with pt stable on trach collar PRVC  30% throughout session. Recommend chair positioning daily with nursing.    Follow Up Recommendations  LTACH;Supervision/Assistance - 24 hour     Equipment Recommendations  Other (comment)(defer to next venue)    Recommendations for Other Services       Precautions / Restrictions Precautions Precautions: Fall Precaution Comments: vent, trach, peg Restrictions Weight Bearing Restrictions: No    Mobility  Bed Mobility Overal bed mobility: Needs Assistance Bed Mobility: Supine to Sit;Sit to Supine     Supine to sit: Total assist;+2 for physical assistance Sit to supine: Total assist;+2 for physical  assistance   General bed mobility comments: utilized foot egress aspect of bed to achieve full sitting position with total assist to reposition trunk and sacrum in sitting. pt with period of apparent intentional anterior translation of trunk in full sitting  Transfers Overall transfer level: Needs assistance   Transfers: Sit to/from Stand Sit to Stand: Total assist;+2 physical assistance         General transfer comment: attempted sit to stand with bil Knees blocked from full foot egress with no noted activation of bil LE or trunk with transition to partial sacral elevation and pt returned to sitting  Ambulation/Gait             General Gait Details: unable   Stairs             Wheelchair Mobility    Modified Rankin (Stroke Patients Only) Modified Rankin (Stroke Patients Only) Pre-Morbid Rankin Score: No significant disability Modified Rankin: Severe disability     Balance Overall balance assessment: Needs assistance Sitting-balance support: Feet supported Sitting balance-Leahy Scale: Zero Sitting balance - Comments: assist to posterior trunk positioning against surface and repositioning in bed                                    Cognition Arousal/Alertness: Lethargic Behavior During Therapy: Flat affect Overall Cognitive Status: Difficult to assess Area of Impairment: Following commands;Attention                   Current Attention Level: Focused  Problem Solving: Decreased initiation General Comments: pt withdrawing face to washcloth, withdrawals bil LE to nail bed pressure, automatic movement of LUE but no command following or visual tracking, automatic engagement for weight shift anteriorly in full chair position      Exercises General Exercises - Lower Extremity Long Arc Quad: PROM;10 reps;Seated;Both Hip Flexion/Marching: PROM;10 reps;Seated;Both    General Comments        Pertinent Vitals/Pain Pain Location:  grimace with noxious stimuli to bil LE, also grimace with full LLE knee extension in sitting Pain Intervention(s): Repositioned    Home Living                      Prior Function            PT Goals (current goals can now be found in the care plan section) Progress towards PT goals: Progressing toward goals    Frequency    Min 2X/week      PT Plan Current plan remains appropriate    Co-evaluation              AM-PAC PT "6 Clicks" Mobility   Outcome Measure  Help needed turning from your back to your side while in a flat bed without using bedrails?: Total Help needed moving from lying on your back to sitting on the side of a flat bed without using bedrails?: Total Help needed moving to and from a bed to a chair (including a wheelchair)?: Total Help needed standing up from a chair using your arms (e.g., wheelchair or bedside chair)?: Total Help needed to walk in hospital room?: Total Help needed climbing 3-5 steps with a railing? 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

## 2018-08-02 NOTE — Progress Notes (Signed)
Case Management referral for [facility].  Per nursing staff, family interested in a facility in [place].  I called pt's son, Hannah Holloway, to discuss this with him; he defers POA to his uncle, Hannah Holloway, regarding disposition arrangements.  Called Hannah Holloway, as requested at [phone]; left message on voicemail requesting callback.    Quintella BatonJulie W. Shalan Neault, RN, BSN  Trauma/Neuro ICU Case Manager [phone]

## 2018-08-03 LAB — BASIC METABOLIC PANEL
Anion gap: 8 (ref 5–15)
BUN: 16 mg/dL (ref 8–23)
CO2: 25 mmol/L (ref 22–32)
Calcium: 9.3 mg/dL (ref 8.9–10.3)
Chloride: 110 mmol/L (ref 98–111)
Creatinine, Ser: 0.63 mg/dL (ref 0.44–1.00)
GFR calc Af Amer: >60 mL/min (ref >60)
GFR calc non Af Amer: >60 mL/min (ref >60)
Glucose, Bld: 148 mg/dL — ABNORMAL HIGH (ref 70–99)
POTASSIUM: 4.2 mmol/L (ref 3.5–5.1)
Sodium: 143 mmol/L (ref 135–145)

## 2018-08-03 LAB — CBC
HCT: 30 % — ABNORMAL LOW (ref 36.0–46.0)
Hemoglobin: 9.2 g/dL — ABNORMAL LOW (ref 12.0–15.0)
MCH: 29.8 pg (ref 26.0–34.0)
MCHC: 30.7 g/dL (ref 30.0–36.0)
MCV: 97.1 fL (ref 80.0–100.0)
NRBC: 0 % (ref 0.0–0.2)
Platelets: 172 10*3/uL (ref 150–400)
RBC: 3.09 MIL/uL — ABNORMAL LOW (ref 3.87–5.11)
RDW: 15.2 % (ref 11.5–15.5)
WBC: 7.3 10*3/uL (ref 4.0–10.5)

## 2018-08-03 LAB — GLUCOSE, CAPILLARY
GLUCOSE-CAPILLARY: 134 mg/dL — AB (ref 70–99)
GLUCOSE-CAPILLARY: 110 mg/dL — AB (ref 70–99)
Glucose-Capillary: 140 mg/dL — ABNORMAL HIGH (ref 70–99)
Glucose-Capillary: 93 mg/dL (ref 70–99)
Glucose-Capillary: 94 mg/dL (ref 70–99)
Glucose-Capillary: 137 mg/dL — ABNORMAL HIGH (ref 70–99)

## 2018-08-03 MED ORDER — DONEPEZIL HCL 10 MG PO TABS
10.0000 mg | ORAL_TABLET | Freq: Every day | ORAL | Status: DC
  Administered 2018-08-03 – 2018-08-05 (×3): 10 mg via ORAL
  Filled 2018-08-03 (×3): qty 1

## 2018-08-03 MED ORDER — BETHANECHOL CHLORIDE 10 MG PO TABS
5.0000 mg | ORAL_TABLET | Freq: Three times a day (TID) | ORAL | Status: DC
  Administered 2018-08-03 (×3): 5 mg via ORAL
  Filled 2018-08-03 (×3): qty 1

## 2018-08-03 NOTE — Progress Notes (Signed)
STROKE TEAM PROGRESS NOTE   SUBJECTIVE (INTERVAL HISTORY) Her RN is at the bedside.  Neurological condition unchanged. Remains very poorly responsive and barely open eyes. Not following commands. Has semi purposeful movements on the left.Serum sodium is not down to 143.she continues to remain on the ventilator. Family wants transfer to LTAC in [place] OBJECTIVE Vitals:   08/03/18 1000 08/03/18 1100 08/03/18 1106 08/03/18 1200  BP: (!) 144/125 136/75  124/75  Pulse: 94 96 87 80  Resp: (!) 25 (!) 27 (!) 31 (!) 23  Temp:    99 F (37.2 C)  TempSrc:    Axillary  SpO2: 100% 100% 100% 100%  Weight:      Height:        CBC:  Recent Labs  Lab 08/02/18 0347 08/03/18 0519  WBC 7.6 7.3  NEUTROABS 6.1  --   HGB 8.7* 9.2*  HCT 29.6* 30.0*  MCV 98.7 97.1  PLT 148* 172    Basic Metabolic Panel:  Recent Labs  Lab 07/31/18 0427  08/02/18 0347 08/03/18 0519  NA 151*   < > 147* 143  K 3.8   < > 4.0 4.2  CL 119*   < > 120* 110  CO2 27   < > 24 25  GLUCOSE 111*   < > 126* 148*  BUN 15   < > 16 16  CREATININE 0.55   < > 0.62 0.63  CALCIUM 9.0   < > 8.8* 9.3  MG 2.4  --  2.1  --   PHOS 3.0  --  3.0  --    < > = values in this interval not displayed.    Lipid Panel:     Component Value Date/Time   TRIG 16 07/26/2018 1331   HgbA1c:  Lab Results  Component Value Date   HGBA1C 5.7 (H) 07/25/2018   Urine Drug Screen: No results found for: LABOPIA, COCAINSCRNUR, LABBENZ, AMPHETMU, THCU, LABBARB  Alcohol Level No results found for: ETH  IMAGING   Ct Head Wo Contrast  Result Date: 07/26/2018 CLINICAL DATA:  72 y/o  F; acute hemorrhage for follow-up. EXAM: CT HEAD WITHOUT CONTRAST TECHNIQUE: Contiguous axial images were obtained from the base of the skull through the vertex without intravenous contrast. COMPARISON:  07/25/2018 CT head.  07/25/2018 MRI head. FINDINGS: Brain: Stable left frontal hemorrhage measuring 4.5 x 3.9 x 3.9 cm (AP x ML x CC series 3, image 14 and  series 5, image 16). Stable right anterior frontal hematoma measuring 21 mm (series 3, image 15). Stable left frontoparietal hemorrhage measuring 3.4 x 5.0 x 3.4 cm (AP x ML x CC series 5, image 45 and series 3, image 17). Stable 11 mm hemorrhage within the left lateral frontal lobe (series 3, image 17). Stable intraventricular hemorrhage pooling in the occipital horns of the lateral ventricles. Stable subarachnoid hemorrhage predominantly in the left greater than right sylvian fissures and along the right anterior frontal lobe. Stable thin subdural hematoma along the left tentorium cerebelli. Stable 10 mm left-to-right midline shift of the anterior septum pellucidum. No new stroke, hemorrhage, focal mass effect, or herniation. Vascular: Calcific atherosclerosis of the carotid siphons. No hyperdense vessel identified. Skull: Normal. Negative for fracture or focal lesion. Sinuses/Orbits: No acute finding. Other: None. IMPRESSION: In comparison with the prior MRI of the head given differences in technique: 1. Stable acute hemorrhages within the left frontal lobe, right frontal lobe, left frontal parietal junction, and left lateral frontal lobe. 2. Stable intraventricular hemorrhage, thin  subdural hematoma along the left tentorium, and small volume subarachnoid hemorrhage. 3. Stable 10 mm left-to-right midline shift of anterior septum pellucidum. 4. No new acute intracranial process identified. Electronically Signed   By: Mitzi Hansen M.D.   On: 07/26/2018 04:54   Mr Brain Wo Contrast  Result Date: 07/25/2018 CLINICAL DATA:  Acute mental status changes. Pulmonary embolus and DVT. EXAM: MRI HEAD WITHOUT CONTRAST TECHNIQUE: Multiplanar, multiecho pulse sequences of the brain and surrounding structures were obtained without intravenous contrast. COMPARISON:  CT head without contrast 07/25/2018 12:25 a.m. FINDINGS: Brain: Multifocal areas of acute hemorrhage are present. Anterior left frontal lobe  hemorrhage measures 4.0 x 4.5 x 4.3 Cm. A new hemorrhage in the posterior left frontal and left parietal lobe measures 5.5 x 3.0 x 3.5 cm. A smaller area of hemorrhage in the anterior inferior right frontal lobe measures 2.4 cm. Left right midline shift is 10 mm. There is effacement of the left lateral ventricle. Blood products are seen within the ventricles bilaterally. No hydrocephalus is evident. Posterior fossa is unremarkable. No acute parenchymal infarct is present otherwise. Vascular: Flow is present in the major intracranial arteries. Skull and upper cervical spine: Craniocervical junction is normal. Upper cervical spine is normal. Sinuses/Orbits: The left sphenoid sinus is partially opacified. No other significant paranasal sinus disease is present. Mastoid air cells are clear. Globes and orbits are within normal limits. IMPRESSION: 1. New large areas of hemorrhage in the anterior left frontal lobe and posterior left frontal and parietal lobe resulting in significant mass effect with left-to-right midline shift up to 10 mm. 2. Smaller area of hemorrhage in the anterior inferior right frontal lobe. 3. Intraventricular hemorrhage with fluid levels in the posterior horns of both lateral ventricles without hydrocephalus. Critical Value/emergent results were called by telephone at the time of interpretation on 07/25/2018 at 6:39 pm to Dr. Hartley Barefoot , who verbally acknowledged these results. Electronically Signed   By: Marin Roberts M.D.   On: 07/25/2018 19:01   Dg Chest Port 1 View  Result Date: 07/25/2018 CLINICAL DATA:  Status post intubation EXAM: PORTABLE CHEST 1 VIEW COMPARISON:  CT 07/25/2018 FINDINGS: Endotracheal tube with tip 4 cm from carina. Normal cardiac silhouette. Aorta is ectatic. Lungs are clear. IMPRESSION: Endotracheal tube in good position. Electronically Signed   By: Genevive Bi M.D.   On: 07/25/2018 20:49   Vas [place] Lower Extremity Venous (dvt)  Result Date:  07/25/2018  Lower Venous Study Indications: SOB, and pulmonary embolism.  Risk Factors: Confirmed PE. Performing Technologist: Toma Deiters RVS  Examination Guidelines: A complete evaluation includes B-mode imaging, spectral Doppler, color Doppler, and power Doppler as needed of all accessible portions of each vessel. Bilateral testing is considered an integral part of a complete examination. Limited examinations for reoccurring indications may be performed as noted.  Right Venous Findings: +---------+---------------+---------+-----------+----------+------------------+          CompressibilityPhasicitySpontaneityPropertiesSummary            +---------+---------------+---------+-----------+----------+------------------+ CFV      Full           Yes      Yes                                     +---------+---------------+---------+-----------+----------+------------------+ SFJ      Full                                                            +---------+---------------+---------+-----------+----------+------------------+  FV Prox  Full           Yes      Yes                                     +---------+---------------+---------+-----------+----------+------------------+ FV Mid   Full                                                            +---------+---------------+---------+-----------+----------+------------------+ FV DistalFull           Yes      Yes                                     +---------+---------------+---------+-----------+----------+------------------+ PFV      Full           Yes      Yes                                     +---------+---------------+---------+-----------+----------+------------------+ POP      Partial        Yes      Yes                                     +---------+---------------+---------+-----------+----------+------------------+ PTV      Partial                                      Acute mid to                                                              proximal           +---------+---------------+---------+-----------+----------+------------------+ PERO                                                  Difficult to                                                             visualize          +---------+---------------+---------+-----------+----------+------------------+ Gastroc  Partial                                      Acute              +---------+---------------+---------+-----------+----------+------------------+  Right Technical Findings:  Difficult to position the leg for distal imaging due to dementia and patient poor to repond.  Left Venous Findings: +---------+---------------+---------+-----------+----------+-------------+          CompressibilityPhasicitySpontaneityPropertiesSummary       +---------+---------------+---------+-----------+----------+-------------+ CFV      Full           Yes      Yes                                +---------+---------------+---------+-----------+----------+-------------+ SFJ      Full                                                       +---------+---------------+---------+-----------+----------+-------------+ FV Prox  Full           Yes      Yes                                +---------+---------------+---------+-----------+----------+-------------+ FV Mid   Full                                                       +---------+---------------+---------+-----------+----------+-------------+ FV DistalFull           Yes      Yes                                +---------+---------------+---------+-----------+----------+-------------+ PFV      Full           Yes      Yes                                +---------+---------------+---------+-----------+----------+-------------+ POP      Partial                                      Mid to distal  +---------+---------------+---------+-----------+----------+-------------+ PTV      None                                                       +---------+---------------+---------+-----------+----------+-------------+ PERO     None                                                       +---------+---------------+---------+-----------+----------+-------------+  Left Technical Findings: Difficult to positin the lef for distal imaging due to dementia and patient poorto respond. Rouleux flow was noted in the proxiaml popliteal vein.   Summary: Right: Findings consistent with acute deep vein thrombosis involving the right gastrocnemius vein, right posterior tibial vein, and right popliteal vein.  See technical findings listed above Left: Findings consistent with acute deep vein thrombosis involving the left peroneal vein, left posterior tibial vein, and left popliteal vein. See technical findings listed above  *See table(s) above for measurements and observations. Electronically signed by Sherald Hess MD on 07/25/2018 at 3:36:49 PM.    Final     Transthoracic Echocardiogram  Normal LV size with EF 65-70%. Normal RV size and systolic   function. Flattened mitral valve closure plane without frank   prolapse, mild to moderate mitral regurgitation. Mild pulmonary   hypertension.  Ct Head Wo Contrast  Result Date: 07/28/2018 CLINICAL DATA:  Follow-up of intracranial hemorrhage EXAM: CT HEAD WITHOUT CONTRAST TECHNIQUE: Contiguous axial images were obtained from the base of the skull through the vertex without intravenous contrast. COMPARISON:  None. FINDINGS: Brain: There is multifocal intraparenchymal hemorrhage redemonstrated throughout the brain. The largest hematomas, in the left frontal lobe and at the left frontoparietal junction, are unchanged. The right frontal pole hematoma is also unchanged. Subarachnoid blood over the left convexity and in the right sylvian fissure is unchanged. The  amount of intraventricular blood is unchanged. There is rightward midline shift that measures 6 mm, unchanged. Vascular: No abnormal hyperdensity of the major intracranial arteries or dural venous sinuses. No intracranial atherosclerosis. Skull: The visualized skull base, calvarium and extracranial soft tissues are normal. Sinuses/Orbits: No fluid levels or advanced mucosal thickening of the visualized paranasal sinuses. No mastoid or middle ear effusion. The orbits are normal. IMPRESSION: Unchanged appearance of multifocal intraparenchymal and extra-axial hemorrhage throughout the brain, with unchanged 6 mm rightward midline shift. Electronically Signed   By: Deatra Robinson M.D.   On: 07/28/2018 04:42    PHYSICAL EXAM  Temp:  [99 F (37.2 C)-101.6 F (38.7 C)] 99 F (37.2 C) (11/30 1200) Pulse Rate:  [68-161] 80 (11/30 1200) Resp:  [13-31] 23 (11/30 1200) BP: (106-144)/(46-125) 124/75 (11/30 1200) SpO2:  [90 %-100 %] 100 % (11/30 1200) FiO2 (%):  [28 %-30 %] 28 % (11/30 1106) Weight:  [75.6 kg] 75.6 kg (11/30 0407)  General -elderly african american lady , s/p tracheostomy not on sedation.  Ophthalmologic - fundi not visualized due to noncooperation.  Cardiovascular - Regular rhythm, no murmur or gallop  Neuro - s/p trach on ventilator, eyes closed, not open on voice but slightly open on pain stimulation.  Not following any commands.  Left pupil 4 mm, brisk to light, right pupil 2.5 mm, brisk to light.  Left gaze preference, no doll's eyes, bilateral corneal weak reflexes.  Gag and cough reflex present. LUE and LLE spontaneous movement noted intermittently.On sternal rub LUE withdraw and localizing to pain, RUE trace withdraw.  LLE withdraws to pain   but RLE trace withdraw minimal..  DTR 1+, bilateral Babinski positive. Sensation, coordination and gait not tested.   ASSESSMENT/PLAN Ms. Hannah Holloway is a 72 y.o. female with history of dementia presenting with generalized weakness, found  down at home, soaked in urine.  She was admitted to [facility] and found to have bilateral PE on chest CT and bilateral DVT and lower extremities.  She was put on heparin IV but then developed worsening mental status for which MRI was done showed left frontal anterior and posterior ICH with small right frontal ICH and IVH.  Patient transferred to [facility] for further evaluation.   ICH: Left anterior and posterior frontal large hematoma, right anterior frontal small hematoma, and IVH bilaterally.  Heparin was reversed with protamine.  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  Resultant unresponsive, intubated  CT head 07/25/2018 no acute abnormality  MRI head showed left anterior and posterior frontal large hematoma, right anterior frontal small hematoma and IVH. However, SWI sequence was not able to obtain due to noncooperation.  CT head repeat 07/26/18 confirmed ICH and IVH as above  CT repeat 08/27/18 stable hematoma and improved MLS  Neurosurgery consulted, Dr. Venetia Maxon recommended against surgical intervention.  2D Echo - EF 65 to 70%  HgbA1c 5.7  VTE prophylaxis - SCDs  No antithrombotic prior to admission, was treated with heparin IV for PE/DVT, now on No antithrombotic given ICH.  Ongoing aggressive stroke risk factor management  Therapy recommendations:  pending  Disposition:  Pending Poor prognosis and son has transferred POA to pt brother Zola Button MD who is neurosurgeon in [place]. Pt another brother Myrtie Neither MD was orthopedics in [place].   Cerebral edema and uncal herniation  CT and MRI showed midline shift 10 mm  anisocoria with L>R, but papillary reflex present bilaterally  CT repeat stable hematoma but improved MLS  Mannitol 1 dose given on admission  Currently off 3% saline  Pt has PICC line  Sodium  144->153->160->157->158  Sodium goal 150 - 160  Sodium check every 6 hours  On tube feeding   Bilateral PE and DVT  Patient found down at home, soaked in urine  Chest CT showed bilateral upper lobe PE  LE venous Doppler showed bilateral DVT  Was on heparin IV which has been discontinued and reversed due to ICH  Respiratory distress  Intubated for not protecting airway  On ventilation  CCM on board  Sepsis  CT chest showed multifocal pneumonia  UA WBC 21-50  Urine culture showed gram-negative rods  Leukocytosis with WBC 11.1->11.0->8.5  Currently on azithromycin, Rocephin  Overnight Tmax 100.1  Hypertension  Stable on cleviprex . BP goal < 140 . Wean off cleviprex as able . Now on po meds with amlodipine, losartan and metoprolol . Long-term BP goal normotensive  Dysphagia   Did not pass swallow  Has OG tube  Will start TF today  Other Stroke Risk Factors  Advanced age  Other Active Problems  Baseline dementia    Hospital day # 9 Plan    Continue weaning ventilatory support on tracheostomy. . Long discussion with Dr Denese Killings and answered questions..Family wants transfer to [facility] in [place] later. This patient is critically ill due to extensive ICH, IVH, bilateral PE/DVT, cerebral edema and uncal herniation, sepsis with pneumonia and UTI and at significant risk of neurological worsening, death form uncal herniation, cerebral edema, heart failure, respiratory failure, seizure and sepsis as well as brain death. This patient's care requires constant monitoring of vital signs, hemodynamics, respiratory and cardiac monitoring, review of multiple databases, neurological assessment, discussion with family, other specialists and medical decision making of high complexity. I spent 30 minutes of neurocritical care time in the care of this patient.    Delia Heady, MD Stroke Neurology 08/03/2018 1:44 PM     To contact Stroke Continuity provider, please  refer to [email or web address]. After hours, contact General Neurology

## 2018-08-03 NOTE — Progress Notes (Signed)
..   NAME:  Hannah Holloway, MRN:  [id number], DOB:  1945-10-01, LOS: 9 ADMISSION DATE:  07/24/2018, CONSULTATION DATE:  07/25/18 REFERRING MD:  Sunnie NielsenEGALADO, CHIEF COMPLAINT:  Altered mental status   Brief History   72 yr old female who was admitted on 07/24/18 for mgmt of Acute PE secondary to lower ext thrombosis from immobility started on Heparin gtt for anticoagulation. Experienced acute change in mental status while in MRI where pt was found to have a large left sided IPH and IVH with midline shift 10 mm.  Past Medical History  Vitamin D deficiency  Significant Hospital Events   11/20 Acute PE and b/l lower leg DVTs 11/21 New ICH, ETT 11/25 IVC filter placed by IR; off cleviprex 11/26 trach  Consults:  Neurology:11/21 Neurosurgery: 11/21  Procedures:  ETT 11/21 >> 11/26 PICC 11/23 >>  Trach 11/26 >> PEG 11/26 >>   Significant Diagnostic Tests:  CT angio chest 11/21 >> upper lobe PE, multifocal infiltrates, coronary calcification Dopper legs 11/21 >> acute deep vein thrombosis involving the right gastrocnemius vein, right posterior tibial vein, and right popliteal vein; acute deep vein thrombosis involving the left peroneal vein, left posterior tibial vein, and left popliteal vein.  MRI brain 11/21 >> anterior Lt frontal lobe and posterior Lt frontal and parietal lobe hemorrhage with 10 mm shift, intraventricular hemorrhage w/o hydrocephalus CT chest 11/24 >> midline shift 6 mm  CXR 11/28 - no underlying lung disease. Micro Data:  Blood 11/20 >> negative Urine 11/21 >> E coli  Antimicrobials:  Rocephin 11/21 >> 11/27 Azithromycin 11/21 >> 11/27  Interim history/subjective:  Has intermittently been on SBT. Episodes of tachypnea.  Objective   Blood pressure 131/70, pulse 91, temperature 99.6 F (37.6 C), temperature source Axillary, resp. rate (!) 21, height 5\' 6"  (1.676 m), weight 75.6 kg, SpO2 100 %.    Vent Mode: PRVC FiO2 (%):  [30 %] 30 % Set Rate:  [12 bmp] 12  bmp Vt Set:  [470 mL] 470 mL PEEP:  [5 cmH20] 5 cmH20 Pressure Support:  [14 cmH20] 14 cmH20 Plateau Pressure:  [14 cmH20-18 cmH20] 14 cmH20   Intake/Output Summary (Last 24 hours) at 08/03/2018 0923 Last data filed at 08/03/2018 0900 Gross per 24 hour  Intake 1801.22 ml  Output 2500 ml  Net -698.78 ml   Filed Weights   08/01/18 0200 08/02/18 0350 08/03/18 0407  Weight: 74.9 kg 75.1 kg 75.6 kg    General - well nourished. Eyes - pupils reactive, roving eye movements ENT - trach site clean, minimal secretions Cardiac - regular rate/rhythm, no murmur.  Extremities warm well perfused.  Normotensive.  No tachycardia. Chest - equal breath sounds b/l, no wheezing or rales tolerating SBT.  Spontaneous hypoventilation. Abdomen - soft, non tender, + bowel sounds, PEG in place Extremities - 1+ edema Skin - no rashes Neuro - opens eyes spontaneously, not following commands.  Agitation on left side try to reposition herself in bed.  Moves left arm semi-purposefully.   Assessment & Plan:   Critically ill due to acute respiratory failure secondary to inability to protect airway.  No underlying acute lung disease.  Should tolerate trach collar trials. Central tachypnea related to intracranial process. Acute intracranial hemorrhage with associated cerebral edema.  Now post bleed day 10.  Low risk of recurrent bleed at this time. Acute pulmonary embolism with bilateral lower leg DVTs.  Filter in place, likely therapy. Hypertension well-controlled on enteral medication.  Plan:  Trach collar trial.  Tolerate some  tachypnea due to patient's neurological status. Continue current therapy. Await LTAC placement.  Overall prognosis remains poor.  Best practice:  Diet: TF DVT prophylaxis: SCDs GI prophylaxis: pepcid Glucose control: SSI Mobility: bedrest Code Status: Full Code Family: No family at bedside Disposition: family would like to arrange for transfer to LTAC in  [place]   Labs:   CMP Latest Ref Rng & Units 08/03/2018 08/02/2018 08/01/2018  Glucose 70 - 99 mg/dL 161(W) 960(A) -  BUN 8 - 23 mg/dL 16 16 -  Creatinine 5.40 - 1.00 mg/dL 9.81 1.91 -  Sodium 478 - 145 mmol/L 143 147(H) 152(H)  Potassium 3.5 - 5.1 mmol/L 4.2 4.0 -  Chloride 98 - 111 mmol/L 110 120(H) -  CO2 22 - 32 mmol/L 25 24 -  Calcium 8.9 - 10.3 mg/dL 9.3 2.9(F) -  Total Protein 6.5 - 8.1 g/dL - - -  Total Bilirubin 0.3 - 1.2 mg/dL - - -  Alkaline Phos 38 - 126 U/L - - -  AST 15 - 41 U/L - - -  ALT 0 - 44 U/L - - -   CBC Latest Ref Rng & Units 08/03/2018 08/02/2018 08/01/2018  WBC 4.0 - 10.5 K/uL 7.3 7.6 7.2  Hemoglobin 12.0 - 15.0 g/dL 6.2(Z) 3.0(Q) 6.5(H)  Hematocrit 36.0 - 46.0 % 30.0(L) 29.6(L) 30.0(L)  Platelets 150 - 400 K/uL 172 148(L) 144(L)   CBG (last 3)  Recent Labs    08/02/18 1559 08/02/18 2341 08/03/18 0305  GLUCAP 128* 120* 134*   ABG    Component Value Date/Time   PHART 7.506 (H) 07/26/2018 1334   PCO2ART 21.3 (L) 07/26/2018 1334   PO2ART 116.0 (H) 07/26/2018 1334   HCO3 16.8 (L) 07/26/2018 1334   TCO2 17 (L) 07/26/2018 1334   ACIDBASEDEF 6.0 (H) 07/26/2018 1334   O2SAT 99.0 07/26/2018 1334   Lynnell Catalan, MD [facility] ICU Physician [facility]  Critical Care  Pager: [phone] Mobile: [phone] After hours: [phone].  08/03/2018, 9:23 AM

## 2018-08-04 LAB — GLUCOSE, CAPILLARY
Glucose-Capillary: 125 mg/dL — ABNORMAL HIGH (ref 70–99)
Glucose-Capillary: 131 mg/dL — ABNORMAL HIGH (ref 70–99)
Glucose-Capillary: 105 mg/dL — ABNORMAL HIGH (ref 70–99)
Glucose-Capillary: 92 mg/dL (ref 70–99)
Glucose-Capillary: 110 mg/dL — ABNORMAL HIGH (ref 70–99)
Glucose-Capillary: 129 mg/dL — ABNORMAL HIGH (ref 70–99)

## 2018-08-04 MED ORDER — BETHANECHOL CHLORIDE 10 MG PO TABS
5.0000 mg | ORAL_TABLET | Freq: Three times a day (TID) | ORAL | Status: DC
  Administered 2018-08-04 – 2018-08-05 (×4): 5 mg
  Filled 2018-08-04 (×4): qty 1

## 2018-08-04 MED ORDER — ORAL CARE MOUTH RINSE
15.0000 mL | OROMUCOSAL | Status: DC
  Administered 2018-08-04 (×2): 15 mL via OROMUCOSAL

## 2018-08-04 MED ORDER — ORAL CARE MOUTH RINSE
15.0000 mL | OROMUCOSAL | Status: DC
  Administered 2018-08-05 – 2018-08-09 (×24): 15 mL via OROMUCOSAL

## 2018-08-04 MED ORDER — ORAL CARE MOUTH RINSE
15.0000 mL | OROMUCOSAL | Status: DC
  Administered 2018-08-04: 15 mL via OROMUCOSAL

## 2018-08-04 NOTE — Plan of Care (Signed)
Patient tolerating tube feedings at goal rate.   Problem: Nutrition: Goal: Dietary intake will improve Outcome: Progressing

## 2018-08-04 NOTE — Progress Notes (Addendum)
STROKE TEAM PROGRESS NOTE   SUBJECTIVE (INTERVAL HISTORY) Her RN is at the bedside.  Neurological condition unchanged. Remains very poorly responsive and barely open eyes. Not following commands. Has semi purposeful movements on the left. She is tolerating trach collar and now off ventilatory support for 24 hours. She is slightly tachycardic. OBJECTIVE Vitals:   08/04/18 0804 08/04/18 0900 08/04/18 0919 08/04/18 1115  BP:  116/67 116/67   Pulse: 90 80 92 82  Resp: (!) 30 (!) 36  (!) 30  Temp:      TempSrc:      SpO2: 99% 100%  100%  Weight:      Height:        CBC:  Recent Labs  Lab 08/02/18 0347 08/03/18 0519  WBC 7.6 7.3  NEUTROABS 6.1  --   HGB 8.7* 9.2*  HCT 29.6* 30.0*  MCV 98.7 97.1  PLT 148* 172    Basic Metabolic Panel:  Recent Labs  Lab 07/31/18 0427  08/02/18 0347 08/03/18 0519  NA 151*   < > 147* 143  K 3.8   < > 4.0 4.2  CL 119*   < > 120* 110  CO2 27   < > 24 25  GLUCOSE 111*   < > 126* 148*  BUN 15   < > 16 16  CREATININE 0.55   < > 0.62 0.63  CALCIUM 9.0   < > 8.8* 9.3  MG 2.4  --  2.1  --   PHOS 3.0  --  3.0  --    < > = values in this interval not displayed.    Lipid Panel:     Component Value Date/Time   TRIG 16 07/26/2018 1331   HgbA1c:  Lab Results  Component Value Date   HGBA1C 5.7 (H) 07/25/2018   Urine Drug Screen: No results found for: LABOPIA, COCAINSCRNUR, LABBENZ, AMPHETMU, THCU, LABBARB  Alcohol Level No results found for: ETH  IMAGING   Ct Head Wo Contrast  Result Date: 07/26/2018 CLINICAL DATA:  72 y/o  F; acute hemorrhage for follow-up. EXAM: CT HEAD WITHOUT CONTRAST TECHNIQUE: Contiguous axial images were obtained from the base of the skull through the vertex without intravenous contrast. COMPARISON:  07/25/2018 CT head.  07/25/2018 MRI head. FINDINGS: Brain: Stable left frontal hemorrhage measuring 4.5 x 3.9 x 3.9 cm (AP x ML x CC series 3, image 14 and series 5, image 16). Stable right anterior frontal hematoma  measuring 21 mm (series 3, image 15). Stable left frontoparietal hemorrhage measuring 3.4 x 5.0 x 3.4 cm (AP x ML x CC series 5, image 45 and series 3, image 17). Stable 11 mm hemorrhage within the left lateral frontal lobe (series 3, image 17). Stable intraventricular hemorrhage pooling in the occipital horns of the lateral ventricles. Stable subarachnoid hemorrhage predominantly in the left greater than right sylvian fissures and along the right anterior frontal lobe. Stable thin subdural hematoma along the left tentorium cerebelli. Stable 10 mm left-to-right midline shift of the anterior septum pellucidum. No new stroke, hemorrhage, focal mass effect, or herniation. Vascular: Calcific atherosclerosis of the carotid siphons. No hyperdense vessel identified. Skull: Normal. Negative for fracture or focal lesion. Sinuses/Orbits: No acute finding. Other: None. IMPRESSION: In comparison with the prior MRI of the head given differences in technique: 1. Stable acute hemorrhages within the left frontal lobe, right frontal lobe, left frontal parietal junction, and left lateral frontal lobe. 2. Stable intraventricular hemorrhage, thin subdural hematoma along the left tentorium, and  small volume subarachnoid hemorrhage. 3. Stable 10 mm left-to-right midline shift of anterior septum pellucidum. 4. No new acute intracranial process identified. Electronically Signed   By: Mitzi HansenLance  Furusawa-Stratton M.D.   On: 07/26/2018 04:54   Mr Brain Wo Contrast  Result Date: 07/25/2018 CLINICAL DATA:  Acute mental status changes. Pulmonary embolus and DVT. EXAM: MRI HEAD WITHOUT CONTRAST TECHNIQUE: Multiplanar, multiecho pulse sequences of the brain and surrounding structures were obtained without intravenous contrast. COMPARISON:  CT head without contrast 07/25/2018 12:25 a.m. FINDINGS: Brain: Multifocal areas of acute hemorrhage are present. Anterior left frontal lobe hemorrhage measures 4.0 x 4.5 x 4.3 Cm. A new hemorrhage in the  posterior left frontal and left parietal lobe measures 5.5 x 3.0 x 3.5 cm. A smaller area of hemorrhage in the anterior inferior right frontal lobe measures 2.4 cm. Left right midline shift is 10 mm. There is effacement of the left lateral ventricle. Blood products are seen within the ventricles bilaterally. No hydrocephalus is evident. Posterior fossa is unremarkable. No acute parenchymal infarct is present otherwise. Vascular: Flow is present in the major intracranial arteries. Skull and upper cervical spine: Craniocervical junction is normal. Upper cervical spine is normal. Sinuses/Orbits: The left sphenoid sinus is partially opacified. No other significant paranasal sinus disease is present. Mastoid air cells are clear. Globes and orbits are within normal limits. IMPRESSION: 1. New large areas of hemorrhage in the anterior left frontal lobe and posterior left frontal and parietal lobe resulting in significant mass effect with left-to-right midline shift up to 10 mm. 2. Smaller area of hemorrhage in the anterior inferior right frontal lobe. 3. Intraventricular hemorrhage with fluid levels in the posterior horns of both lateral ventricles without hydrocephalus. Critical Value/emergent results were called by telephone at the time of interpretation on 07/25/2018 at 6:39 pm to Dr. [name] , who verbally acknowledged these results. Electronically Signed   By: Marin Robertshristopher  Mattern M.D.   On: 07/25/2018 19:01   Dg Chest Port 1 View  Result Date: 07/25/2018 CLINICAL DATA:  Status post intubation EXAM: PORTABLE CHEST 1 VIEW COMPARISON:  CT 07/25/2018 FINDINGS: Endotracheal tube with tip 4 cm from carina. Normal cardiac silhouette. Aorta is ectatic. Lungs are clear. IMPRESSION: Endotracheal tube in good position. Electronically Signed   By: Genevive BiStewart  Edmunds M.D.   On: 07/25/2018 20:49   Vas [place] Lower Extremity Venous (dvt)  Result Date: 07/25/2018  Lower Venous Study Indications: SOB, and pulmonary  embolism.  Risk Factors: Confirmed PE. Performing Technologist: Toma DeitersVirginia Slaughter RVS  Examination Guidelines: A complete evaluation includes B-mode imaging, spectral Doppler, color Doppler, and power Doppler as needed of all accessible portions of each vessel. Bilateral testing is considered an integral part of a complete examination. Limited examinations for reoccurring indications may be performed as noted.  Right Venous Findings: +---------+---------------+---------+-----------+----------+------------------+          CompressibilityPhasicitySpontaneityPropertiesSummary            +---------+---------------+---------+-----------+----------+------------------+ CFV      Full           Yes      Yes                                     +---------+---------------+---------+-----------+----------+------------------+ SFJ      Full                                                            +---------+---------------+---------+-----------+----------+------------------+  FV Prox  Full           Yes      Yes                                     +---------+---------------+---------+-----------+----------+------------------+ FV Mid   Full                                                            +---------+---------------+---------+-----------+----------+------------------+ FV DistalFull           Yes      Yes                                     +---------+---------------+---------+-----------+----------+------------------+ PFV      Full           Yes      Yes                                     +---------+---------------+---------+-----------+----------+------------------+ POP      Partial        Yes      Yes                                     +---------+---------------+---------+-----------+----------+------------------+ PTV      Partial                                      Acute mid to                                                             proximal            +---------+---------------+---------+-----------+----------+------------------+ PERO                                                  Difficult to                                                             visualize          +---------+---------------+---------+-----------+----------+------------------+ Gastroc  Partial                                      Acute              +---------+---------------+---------+-----------+----------+------------------+  Right Technical Findings:  Difficult to position the leg for distal imaging due to dementia and patient poor to repond.  Left Venous Findings: +---------+---------------+---------+-----------+----------+-------------+          CompressibilityPhasicitySpontaneityPropertiesSummary       +---------+---------------+---------+-----------+----------+-------------+ CFV      Full           Yes      Yes                                +---------+---------------+---------+-----------+----------+-------------+ SFJ      Full                                                       +---------+---------------+---------+-----------+----------+-------------+ FV Prox  Full           Yes      Yes                                +---------+---------------+---------+-----------+----------+-------------+ FV Mid   Full                                                       +---------+---------------+---------+-----------+----------+-------------+ FV DistalFull           Yes      Yes                                +---------+---------------+---------+-----------+----------+-------------+ PFV      Full           Yes      Yes                                +---------+---------------+---------+-----------+----------+-------------+ POP      Partial                                      Mid to distal +---------+---------------+---------+-----------+----------+-------------+ PTV      None                                                        +---------+---------------+---------+-----------+----------+-------------+ PERO     None                                                       +---------+---------------+---------+-----------+----------+-------------+  Left Technical Findings: Difficult to positin the lef for distal imaging due to dementia and patient poorto respond. Rouleux flow was noted in the proxiaml popliteal vein.   Summary: Right: Findings consistent with acute deep vein thrombosis involving the right gastrocnemius vein, right posterior tibial vein, and right popliteal vein.  See technical findings listed above Left: Findings consistent with acute deep vein thrombosis involving the left peroneal vein, left posterior tibial vein, and left popliteal vein. See technical findings listed above  *See table(s) above for measurements and observations. Electronically signed by Sherald Hess MD on 07/25/2018 at 3:36:49 PM.    Final     Transthoracic Echocardiogram  Normal LV size with EF 65-70%. Normal RV size and systolic   function. Flattened mitral valve closure plane without frank   prolapse, mild to moderate mitral regurgitation. Mild pulmonary   hypertension.  Ct Head Wo Contrast  Result Date: 07/28/2018 CLINICAL DATA:  Follow-up of intracranial hemorrhage EXAM: CT HEAD WITHOUT CONTRAST TECHNIQUE: Contiguous axial images were obtained from the base of the skull through the vertex without intravenous contrast. COMPARISON:  None. FINDINGS: Brain: There is multifocal intraparenchymal hemorrhage redemonstrated throughout the brain. The largest hematomas, in the left frontal lobe and at the left frontoparietal junction, are unchanged. The right frontal pole hematoma is also unchanged. Subarachnoid blood over the left convexity and in the right sylvian fissure is unchanged. The amount of intraventricular blood is unchanged. There is rightward midline shift that measures 6 mm, unchanged. Vascular:  No abnormal hyperdensity of the major intracranial arteries or dural venous sinuses. No intracranial atherosclerosis. Skull: The visualized skull base, calvarium and extracranial soft tissues are normal. Sinuses/Orbits: No fluid levels or advanced mucosal thickening of the visualized paranasal sinuses. No mastoid or middle ear effusion. The orbits are normal. IMPRESSION: Unchanged appearance of multifocal intraparenchymal and extra-axial hemorrhage throughout the brain, with unchanged 6 mm rightward midline shift. Electronically Signed   By: Deatra Robinson M.D.   On: 07/28/2018 04:42    PHYSICAL EXAM  Temp:  [99.1 F (37.3 C)-100.5 F (38.1 C)] 100.5 F (38.1 C) (12/01 0800) Pulse Rate:  [72-92] 82 (12/01 1115) Resp:  [16-40] 30 (12/01 1115) BP: (94-144)/(64-105) 116/67 (12/01 0919) SpO2:  [97 %-100 %] 100 % (12/01 1115) FiO2 (%):  [28 %] 28 % (12/01 1115) Weight:  [76 kg] 76 kg (12/01 0431)  General -elderly african american lady , s/p tracheostomy not on sedation.  Ophthalmologic - fundi not visualized due to noncooperation.  Cardiovascular - Regular rhythm, no murmur or gallop  Neuro - s/p trach on ventilator, eyes closed, not open on voice but slightly open on pain stimulation.  Not following any commands.  Left pupil 4 mm, brisk to light, right pupil 2.5 mm, brisk to light.  Left gaze preference, no doll's eyes, bilateral corneal weak reflexes.  Gag and cough reflex present. LUE and LLE spontaneous movement noted intermittently.On sternal rub LUE withdraw and localizing to pain, RUE trace withdraw.  LLE withdraws to pain   but RLE trace withdraw minimal..  DTR 1+, bilateral Babinski positive. Sensation, coordination and gait not tested.   ASSESSMENT/PLAN Ms. Hannah Holloway is a 72 y.o. female with history of dementia presenting with generalized weakness, found down at home, soaked in urine.  She was admitted to [facility] and found to have bilateral PE on chest CT and  bilateral DVT and lower extremities.  She was put on heparin IV but then developed worsening mental status for which MRI was done showed left frontal anterior and posterior ICH with small right frontal ICH and IVH.  Patient transferred to [facility] for further evaluation.   ICH: Left anterior and posterior frontal large hematoma, right anterior frontal small hematoma, and IVH bilaterally.  Heparin was reversed with protamine.  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  Resultant unresponsive, intubated  CT head 07/25/2018 no acute abnormality  MRI head showed left anterior and posterior frontal large hematoma, right anterior frontal small hematoma and IVH. However, SWI sequence was not able to obtain due to noncooperation.  CT head repeat 07/26/18 confirmed ICH and IVH as above  CT repeat 08/27/18 stable hematoma and improved MLS  Neurosurgery consulted, Dr. Venetia Maxon recommended against surgical intervention.  2D Echo - EF 65 to 70%  HgbA1c 5.7  VTE prophylaxis - SCDs  No antithrombotic prior to admission, was treated with heparin IV for PE/DVT, now on No antithrombotic given ICH.  Ongoing aggressive stroke risk factor management  Therapy recommendations:  pending  Disposition:  Pending Poor prognosis and son has transferred POA to pt brother Zola Button MD who is neurosurgeon in [place]. Pt another brother Myrtie Neither MD was orthopedics in [place].   Cerebral edema and uncal herniation  CT and MRI showed midline shift 10 mm  anisocoria with L>R, but papillary reflex present bilaterally  CT repeat stable hematoma but improved MLS  Mannitol 1 dose given on admission  Currently off 3% saline  Pt has PICC line  Sodium 144->153->160->157->158  Sodium goal 150 - 160  Sodium check every 6 hours  On tube feeding   Bilateral PE and DVT  Patient found  down at home, soaked in urine  Chest CT showed bilateral upper lobe PE  LE venous Doppler showed bilateral DVT  Was on heparin IV which has been discontinued and reversed due to ICH  Respiratory distress  Intubated for not protecting airway  On ventilation  CCM on board  Sepsis  CT chest showed multifocal pneumonia  UA WBC 21-50  Urine culture showed gram-negative rods  Leukocytosis with WBC 11.1->11.0->8.5  Currently on azithromycin, Rocephin  Overnight Tmax 100.1  Hypertension  Stable on cleviprex . BP goal < 140 . Wean off cleviprex as able . Now on po meds with amlodipine, losartan and metoprolol . Long-term BP goal normotensive  Dysphagia   Did not pass swallow  Has OG tube  Will start TF today  Other Stroke Risk Factors  Advanced age  Other Active Problems  Baseline dementia    Hospital day # 10 Plan    Continue weaning ventilatory support on tracheostomy. She is now on trach collar for 24 hours and if he remains stable may consider transferring to nursing home for rehabilitation rather than an LTACH. Long discussion with Dr Denese Killings and answered questions.. Stroke team will sign off. Call for questions. This patient is critically ill due to extensive ICH, IVH, bilateral PE/DVT, cerebral edema and uncal herniation, sepsis with pneumonia and UTI and at significant risk of neurological worsening, death form uncal herniation, cerebral edema, heart failure, respiratory failure, seizure and sepsis as well as brain death. This patient's care requires constant monitoring of vital signs, hemodynamics, respiratory and cardiac monitoring, review of multiple databases, neurological assessment, discussion with family, other specialists and medical decision making of high complexity. I spent 30 minutes of neurocritical care time in the care of this patient.    Delia Heady, MD Stroke Neurology 08/04/2018 12:02 PM     To contact Stroke Continuity provider,  please refer to [email or web address]. After hours, contact General Neurology

## 2018-08-04 NOTE — Progress Notes (Signed)
..   NAME:  Hannah Holloway, MRN:  [id number], DOB:  16-Jul-1946, LOS: 10 ADMISSION DATE:  07/24/2018, CONSULTATION DATE:  07/25/18 REFERRING MD:  Sunnie NielsenEGALADO, CHIEF COMPLAINT:  Altered mental status   Brief History   72 yr old female who was admitted on 07/24/18 for mgmt of Acute PE secondary to lower ext thrombosis from immobility started on Heparin gtt for anticoagulation. Experienced acute change in mental status while in MRI where pt was found to have a large left sided IPH and IVH with midline shift 10 mm.  Past Medical History  Vitamin D deficiency  Significant Hospital Events   11/20 Acute PE and b/l lower leg DVTs 11/21 New ICH, ETT 11/25 IVC filter placed by IR; off cleviprex 11/26 trach  Consults:  Neurology:11/21 Neurosurgery: 11/21  Procedures:  ETT 11/21 >> 11/26 PICC 11/23 >>  Trach 11/26 >> PEG 11/26 >>   Significant Diagnostic Tests:  CT angio chest 11/21 >> upper lobe PE, multifocal infiltrates, coronary calcification Dopper legs 11/21 >> acute deep vein thrombosis involving the right gastrocnemius vein, right posterior tibial vein, and right popliteal vein; acute deep vein thrombosis involving the left peroneal vein, left posterior tibial vein, and left popliteal vein.  MRI brain 11/21 >> anterior Lt frontal lobe and posterior Lt frontal and parietal lobe hemorrhage with 10 mm shift, intraventricular hemorrhage w/o hydrocephalus CT chest 11/24 >> midline shift 6 mm  CXR 11/28 - no underlying lung disease. Micro Data:  Blood 11/20 >> negative Urine 11/21 >> E coli  Antimicrobials:  Rocephin 11/21 >> 11/27 Azithromycin 11/21 >> 11/27  Interim history/subjective:  Tolerated trach collar overnight.   Objective   Blood pressure 117/69, pulse 79, temperature 99.1 F (37.3 C), temperature source Axillary, resp. rate (!) 31, height 5\' 6"  (1.676 m), weight 76 kg, SpO2 100 %.     Intake/Output Summary (Last 24 hours) at 08/04/2018 0754 Last data filed at  08/04/2018 0700 Gross per 24 hour  Intake 1070.91 ml  Output 2425 ml  Net -1354.09 ml   Filed Weights   08/02/18 0350 08/03/18 0407 08/04/18 0431  Weight: 75.1 kg 75.6 kg 76 kg   General - well nourished. Eyes - pupils reactive, roving eye movements ENT - trach site clean, minimal secretions Cardiac - regular rate/rhythm, no murmur.  Extremities warm well perfused.  Normotensive.  No tachycardia. Chest - equal breath sounds b/l, no wheezing or rales tolerating SBT.  Spontaneous hypoventilation. Abdomen - soft, non tender, + bowel sounds, PEG in place Extremities - 1+ edema Skin - no rashes Neuro - opens eyes spontaneously, not following commands.  Agitation on left side try to reposition herself in bed.  Moves left arm semi-purposefully.   Assessment & Plan:   Previously critically ill due to acute respiratory failure secondary to inability to protect airway.  No underlying acute lung disease.  Tolerating trach collar. Central tachypnea related to intracranial process. Acute intracranial hemorrhage with associated cerebral edema.  Now post bleed day 10.  Low risk of recurrent bleed at this time. Acute pulmonary embolism with bilateral lower leg DVTs.  Filter in place, likely definitive therapy. Hypertension well-controlled on enteral medication.  Plan:  Trach collar - deflate cuff. Tolerate some tachypnea due to patient's neurological status. Continue current therapy. Can transfer to floor tomorrow if continues to do well.  Remove PICC. Await LTAC placement.  Overall prognosis remains poor.  Best practice:  Diet: TF DVT prophylaxis: SCDs GI prophylaxis: pepcid Glucose control: SSI - control adequate Mobility:  bedrest Code Status: Full Code Family: No family at bedside Disposition: family would like to arrange for transfer to [facility] in [place]   Labs:   CMP Latest Ref Rng & Units 08/03/2018 08/02/2018 08/01/2018  Glucose 70 - 99 mg/dL 161(W) 960(A) -  BUN 8 - 23  mg/dL 16 16 -  Creatinine 5.40 - 1.00 mg/dL 9.81 1.91 -  Sodium 478 - 145 mmol/L 143 147(H) 152(H)  Potassium 3.5 - 5.1 mmol/L 4.2 4.0 -  Chloride 98 - 111 mmol/L 110 120(H) -  CO2 22 - 32 mmol/L 25 24 -  Calcium 8.9 - 10.3 mg/dL 9.3 2.9(F) -  Total Protein 6.5 - 8.1 g/dL - - -  Total Bilirubin 0.3 - 1.2 mg/dL - - -  Alkaline Phos 38 - 126 U/L - - -  AST 15 - 41 U/L - - -  ALT 0 - 44 U/L - - -   CBC Latest Ref Rng & Units 08/03/2018 08/02/2018 08/01/2018  WBC 4.0 - 10.5 K/uL 7.3 7.6 7.2  Hemoglobin 12.0 - 15.0 g/dL 6.2(Z) 3.0(Q) 6.5(H)  Hematocrit 36.0 - 46.0 % 30.0(L) 29.6(L) 30.0(L)  Platelets 150 - 400 K/uL 172 148(L) 144(L)   CBG (last 3)  Recent Labs    08/03/18 1947 08/03/18 2304 08/04/18 0314  GLUCAP 94 137* 125*   ABG    Component Value Date/Time   PHART 7.506 (H) 07/26/2018 1334   PCO2ART 21.3 (L) 07/26/2018 1334   PO2ART 116.0 (H) 07/26/2018 1334   HCO3 16.8 (L) 07/26/2018 1334   TCO2 17 (L) 07/26/2018 1334   ACIDBASEDEF 6.0 (H) 07/26/2018 1334   O2SAT 99.0 07/26/2018 1334   Lynnell Catalan, MD [facility] ICU Physician [facility] [place] [facility]  Pager: [phone] Mobile: [phone] After hours: [phone].  08/04/2018, 7:54 AM

## 2018-08-04 NOTE — Plan of Care (Signed)
  Problem: Nutrition: Goal: Adequate nutrition will be maintained Outcome: Progressing    Problem: Elimination: Goal: Will not experience complications related to bowel motility Outcome: Progressing   Problem: Elimination: Goal: Will not experience complications related to urinary retention Outcome: Not Progressing  Monitoring for urinary retention via bladder scanning. I&O once during shift.   Pt continues to tolerate tube feeds and have regular bowel movements.

## 2018-08-04 NOTE — Progress Notes (Signed)
PICC line removed per order. RN aware to have patient in bed for 30 minutes until 1245 and to keep dressing dry and intact for 24 hours. Vaseline gauze dressing applied, clean dry and intact

## 2018-08-05 LAB — BASIC METABOLIC PANEL
Anion gap: 8 (ref 5–15)
BUN: 20 mg/dL (ref 8–23)
CO2: 25 mmol/L (ref 22–32)
Calcium: 8.8 mg/dL — ABNORMAL LOW (ref 8.9–10.3)
Chloride: 105 mmol/L (ref 98–111)
Creatinine, Ser: 0.56 mg/dL (ref 0.44–1.00)
GFR calc Af Amer: >60 mL/min (ref >60)
Glucose, Bld: 172 mg/dL — ABNORMAL HIGH (ref 70–99)
Potassium: 4 mmol/L (ref 3.5–5.1)
Sodium: 138 mmol/L (ref 135–145)

## 2018-08-05 LAB — GLUCOSE, CAPILLARY
GLUCOSE-CAPILLARY: 111 mg/dL — AB (ref 70–99)
Glucose-Capillary: 107 mg/dL — ABNORMAL HIGH (ref 70–99)
Glucose-Capillary: 128 mg/dL — ABNORMAL HIGH (ref 70–99)
Glucose-Capillary: 139 mg/dL — ABNORMAL HIGH (ref 70–99)
Glucose-Capillary: 91 mg/dL (ref 70–99)

## 2018-08-05 LAB — CBC
HCT: 34.7 % — ABNORMAL LOW (ref 36.0–46.0)
Hemoglobin: 10.9 g/dL — ABNORMAL LOW (ref 12.0–15.0)
MCH: 29.3 pg (ref 26.0–34.0)
MCHC: 31.4 g/dL (ref 30.0–36.0)
MCV: 93.3 fL (ref 80.0–100.0)
PLATELETS: 245 10*3/uL (ref 150–400)
RBC: 3.72 MIL/uL — ABNORMAL LOW (ref 3.87–5.11)
RDW: 14.3 % (ref 11.5–15.5)
WBC: 5.9 10*3/uL (ref 4.0–10.5)
nRBC: 0.3 % — ABNORMAL HIGH (ref 0.0–0.2)

## 2018-08-05 MED ORDER — CHLORHEXIDINE GLUCONATE 0.12 % MT SOLN
OROMUCOSAL | Status: AC
  Filled 2018-08-05: qty 15

## 2018-08-05 MED ORDER — BETHANECHOL CHLORIDE 10 MG PO TABS
10.0000 mg | ORAL_TABLET | Freq: Three times a day (TID) | ORAL | Status: DC
  Administered 2018-08-05 – 2018-08-09 (×12): 10 mg
  Filled 2018-08-05 (×13): qty 1

## 2018-08-05 NOTE — Evaluation (Signed)
Clinical/Bedside Swallow Evaluation Patient Details  Name: Hannah Holloway MRN: [id number] Date of Birth: 07-16-1946  Today's Date: 08/05/2018 Time: SLP Start Time (ACUTE ONLY): 1345 SLP Stop Time (ACUTE ONLY): 1410 SLP Time Calculation (min) (ACUTE ONLY): 25 min  Past Medical History: History reviewed. No pertinent past medical history. Past Surgical History:  Past Surgical History:  Procedure Laterality Date  . IR GASTROSTOMY TUBE MOD SED  07/30/2018  . IR IVC FILTER PLMT / S&I /IMG GUID/MOD SED  07/29/2018   HPI:  72 yr old female who was admitted on 07/24/18 for mgmt of Acute PE secondary to lower ext thrombosis from immobility started on Heparin gtt for anticoagulation. Experienced acute change in mental status while in MRI where pt was found to have a large left sided IPH and IVH with midline shift 10 mm. Pt has central tachypnea due to intracranial process. Intubated 11/21 to 11/26, trach placed 11/26, trach collar 12/1.    Assessment / Plan / Recommendation Clinical Impression  Pt awake but not attentive to voice, though somewaht responsve to tactile stimuli. Given this finding provided tacitle cues with ice to assess if automatic response to PO is present. Pt opened mouth for ice but did not orally manipulate or initaite a swallow over 30 second interval. Attempted hand over hand assit with cup, which pt did appear to sense in her hand but did not make any move that indicated recognition. Chorea-like movement with left UE limited ability to carry on. Will continue effort to improve pts response and awareness to PO.  SLP Visit Diagnosis: Dysphagia, oropharyngeal phase (R13.12)    Aspiration Risk  Severe aspiration risk;Risk for inadequate nutrition/hydration    Diet Recommendation NPO        Other  Recommendations     Follow up Recommendations Skilled Nursing facility      Frequency and Duration min 2x/week          Prognosis Prognosis for Safe Diet Advancement:  Guarded Barriers to Reach Goals: Severity of deficits      Swallow Study   General HPI: 72 yr old female who was admitted on 07/24/18 for mgmt of Acute PE secondary to lower ext thrombosis from immobility started on Heparin gtt for anticoagulation. Experienced acute change in mental status while in MRI where pt was found to have a large left sided IPH and IVH with midline shift 10 mm. Pt has central tachypnea due to intracranial process. Intubated 11/21 to 11/26, trach placed 11/26, trach collar 12/1.  Type of Study: Bedside Swallow Evaluation Previous Swallow Assessment: none Diet Prior to this Study: NPO Temperature Spikes Noted: No Respiratory Status: Trach Collar History of Recent Intubation: Yes Length of Intubations (days): 6 days Date extubated: 07/30/18 Behavior/Cognition: Doesn't follow directions Self-Feeding Abilities: Total assist Patient Positioning: Upright in bed Baseline Vocal Quality: Not observed Volitional Cough: Cognitively unable to elicit Volitional Swallow: Unable to elicit    Oral/Motor/Sensory Function Overall Oral Motor/Sensory Function: Other (comment)(does not follow commands, suspect right CN VII weakenss)   Ice Chips Ice chips: Impaired Presentation: Spoon Oral Phase Impairments: Poor awareness of bolus;Reduced lingual movement/coordination;Reduced labial seal Oral Phase Functional Implications: Prolonged oral transit   Thin Liquid Thin Liquid: Not tested    Nectar Thick Nectar Thick Liquid: Not tested   Honey Thick Honey Thick Liquid: Not tested   Puree Puree: Not tested   Solid     Solid: Not tested     Harlon DittyBonnie Dosha Broshears, MA CCC-SLP  Acute Rehabilitation Services Pager [phone] Office  [phone]  Zameer Borman, Riley Nearing 08/05/2018,3:06 PM

## 2018-08-05 NOTE — Progress Notes (Signed)
Left additional messages for pt's brother, Junius Creameraul Manz, and spoke to her other brother Merton Borderrthur, to discuss discharge planning.  Per Merton BorderArthur, brother Renae Fickleaul is coordinating possible dc to [facility] in [place], and I need to speak with him.  I explained that I have left Renae Fickleaul messages with no return call back.  Merton Borderrthur states he will notify Renae Fickleaul and have him get in touch with me.   Confirmed Paul's phone # with Merton BorderArthur as [phone].  Bedside nurse to call me when any family arrives.    Quintella BatonJulie W. Rorie Delmore, RN, BSN  Trauma/Neuro ICU Case Manager [phone]

## 2018-08-05 NOTE — Progress Notes (Signed)
Initial Nutrition Assessment  DOCUMENTATION CODES:   Not applicable  INTERVENTION:   Continue:  Osmolite 1.2 @ 55 ml/hr (1320 ml/day) via PEG 30 ml Prostat daily  Provides: 1684 kcal, 88 grams protein, and 1070 ml free water.    NUTRITION DIAGNOSIS:   Inadequate oral intake related to inability to eat as evidenced by NPO status. Ongoing.   GOAL:   Patient will meet greater than or equal to 90% of their needs Met.   MONITOR:   TF tolerance  ASSESSMENT:   Pt with PMH of dementia who was found down at home. She was admitted to [facility] with acute PE secondary to LE DVT from immobility. Pt started on heparin and developed a large L IVH with 10 mm shift.    Pt discussed during ICU rounds and with RN.  Plan for LTACH  11/26 PEG  Pt has tolerated trach collar x 48 hours  Medications reviewed  Labs reviewed    Diet Order:   Diet Order    None      EDUCATION NEEDS:   No education needs have been identified at this time  Skin:  Skin Assessment: Reviewed RN Assessment  Last BM:  12/1  Height:   Ht Readings from Last 1 Encounters:  07/24/18 5' 6" (1.676 m)    Weight:   Wt Readings from Last 1 Encounters:  08/05/18 74 kg    Ideal Body Weight:  59 kg  BMI:  Body mass index is 26.33 kg/m.  Estimated Nutritional Needs:   Kcal:  1650-1850  Protein:  87-100 grams  Fluid:  >1.7 L/day    RD, LDN, CNSC [phone] Pager [phone] After Hours Pager  

## 2018-08-05 NOTE — Evaluation (Signed)
Passy-Muir Speaking Valve - Evaluation Patient Details  Name: Hannah Holloway MRN: [id number] Date of Birth: Jul 09, 1946  Today's Date: 08/05/2018 Time: 1345-1410 SLP Time Calculation (min) (ACUTE ONLY): 25 min  Past Medical History: History reviewed. No pertinent past medical history. Past Surgical History:  Past Surgical History:  Procedure Laterality Date  . IR GASTROSTOMY TUBE MOD SED  07/30/2018  . IR IVC FILTER PLMT / S&I /IMG GUID/MOD SED  07/29/2018   HPI:  72 yr old female who was admitted on 07/24/18 for mgmt of Acute PE secondary to lower ext thrombosis from immobility started on Heparin gtt for anticoagulation. Experienced acute change in mental status while in MRI where pt was found to have a large left sided IPH and IVH with midline shift 10 mm. Pt has central tachypnea due to intracranial process. Intubated 11/21 to 11/26, trach placed 11/26, trach collar 12/1.    Assessment / Plan / Recommendation Clinical Impression  Though pt tolerated PMSV placement relatively well she did not demosntrate any awareness of the presence of the valve, made no purposeful movement or response and did not phonate. Difficult to assess upper airway patentcy without effort from pt, though after 5 minute intervals pts RR consistently increased with shorter inspiration time. Reading on monitor did not indicate a change, but the readings were not reliable given pts restless, almost chorea like movement and poor wave form for spO2 and RR. There is a possiblity pt was experiencing incomplete redirection or air to upper airway with a build up of pressure behind PMSV. Will continue efforts with PMSV and opportunities for responsive to multimodal stimuli. PMSV with SLP only.  SLP Visit Diagnosis: Dysarthria and anarthria (R47.1)    SLP Assessment  Patient needs continued Speech Lanaguage Pathology Services    Follow Up Recommendations  Skilled Nursing facility    Frequency and Duration min 2x/week  2  weeks    PMSV Trial PMSV was placed for: 5 minute intervals Able to redirect subglottic air through upper airway: Yes Able to Attain Phonation: No attempt to phonate Able to Expectorate Secretions: No attempts Respirations During Trial: 25 SpO2 During Trial: (poor signal) Pulse During Trial: 85 Behavior: No attempt to communicate   Tracheostomy Tube  Additional Tracheostomy Tube Assessment Trach Collar Period: 24 hours past two days.  Secretion Description: not observed    Vent Dependency  FiO2 (%): 28 %    Cuff Deflation Trial  GO  Hannah DittyBonnie Traeger Sultana, MA CCC-SLP  Acute Rehabilitation Services Pager [phone] Office [phone]           Hannah Holloway, Hannah Holloway 08/05/2018, 2:55 PM

## 2018-08-05 NOTE — Care Management Note (Signed)
Case Management Note  Patient Details  Name: Hannah Holloway MRN: [id number] Date of Birth: 07/29/1946  Subjective/Objective:    72 yr old female who was admitted on 07/24/18 for mgmt of Acute PE secondary to lower ext thrombosis from immobility started on Heparin gtt for anticoagulation. Experienced acute change in mental status while in MRI where pt was found to have a large left sided IPH and IVH with midline shift 10 mm.                  Action/Plan: Pt s/p tracheostomy and PEG placement on 07/30/18.  Family [place]    Expected Discharge Date:  (unknown)               Expected Discharge Plan:  Long Term Acute Care (LTAC)  In-House Referral:     Discharge planning Services  CM Consult  Post Acute Care Choice:    Choice offered to:     DME Arranged:    DME Agency:     HH Arranged:    HH Agency:     Status of Service:  In process, will continue to follow  If discussed at Long Length of Stay Meetings, dates discussed:    Additional Comments:  Quintella BatonJulie W. Amanada Philbrick, RN, BSN  Trauma/Neuro ICU Case Manager [phone]

## 2018-08-05 NOTE — Progress Notes (Signed)
Spoke with pt's brother Dr. Zola ButtonPaul Carter, to discuss possible transition to LTAC.  He is agreeable to this, and prefers referral be made to [facility] in [place], [place].  Apparently, Dr. Celene Skeenarter's medical practice is across the street from facility, and he performs surgery there at times.  Dr Montez Moritaarter states that before patient transfers to [facility], he wants her to have a colostomy and urostomy performed to prevent skin breakdown at facility.  I explained that this is not something that is routinely done, but he states it is preventive and appropriate for her care.  I advised him to speak with attending MD, and stated I would leave a note requesting that MD call him to discuss.  He did agree to my making a referral to [facility], but states he wants these procedures done before her dc.  Will follow progress.  Quintella BatonJulie W. Brinlynn Gorton, RN, BSN  Trauma/Neuro ICU Case Manager [phone]

## 2018-08-05 NOTE — Progress Notes (Addendum)
..   NAME:  Hannah Holloway, MRN:  [id number], DOB:  September 12, 1945, LOS: 11 ADMISSION DATE:  07/24/2018, CONSULTATION DATE:  07/25/18 REFERRING MD:  Sunnie NielsenEGALADO, CHIEF COMPLAINT:  Altered mental status   Brief History   72 yr old female who was admitted on 07/24/18 for mgmt of Acute PE secondary to lower ext thrombosis from immobility started on Heparin gtt for anticoagulation. Experienced acute change in mental status while in MRI where pt was found to have a large left sided IPH and IVH with midline shift 10 mm.  Past Medical History  Vitamin D deficiency  Significant Hospital Events   11/20 Acute PE and b/l lower leg DVTs 11/21 New ICH, ETT 11/25 IVC filter placed by IR; off cleviprex 11/26 trach 11/30 started on ATC 12/1 and 12/2 tolerated ATC throughout day   Consults:  Neurology:11/21 Neurosurgery: 11/21  Procedures:  ETT 11/21 >> 11/26 PICC 11/23 >> 12/1 Trach 11/26 >> PEG 11/26 >>   Significant Diagnostic Tests:  CT angio chest 11/21 >> upper lobe PE, multifocal infiltrates, coronary calcification Dopper legs 11/21 >> acute deep vein thrombosis involving the right gastrocnemius vein, right posterior tibial vein, and right popliteal vein; acute deep vein thrombosis involving the left peroneal vein, left posterior tibial vein, and left popliteal vein.  MRI brain 11/21 >> anterior Lt frontal lobe and posterior Lt frontal and parietal lobe hemorrhage with 10 mm shift, intraventricular hemorrhage w/o hydrocephalus CT chest 11/24 >> midline shift 6 mm  CXR 11/28 - no underlying lung disease.  Micro Data:  Blood 11/20 >> negative Urine 11/21 >> E coli  Antimicrobials:  Rocephin 11/21 >> 11/27 Azithromycin 11/21 >> 11/27  Interim history/subjective:  Continues to tolerate ATC. No acute events.  Objective   Blood pressure 104/87, pulse 88, temperature 98.1 F (36.7 C), temperature source Axillary, resp. rate (!) 24, height 5\' 6"  (1.676 m), weight 74 kg, SpO2 100 %.      Intake/Output Summary (Last 24 hours) at 08/05/2018 0850 Last data filed at 08/05/2018 0800 Gross per 24 hour  Intake 1550.1 ml  Output 1425 ml  Net 125.1 ml   Filed Weights   08/03/18 0407 08/04/18 0431 08/05/18 0325  Weight: 75.6 kg 76 kg 74 kg   General - well nourished female. Eyes - left pupil > right though reactive bilaterally ENT - trach site clean, minimal secretions Cardiac - RRR Chest - CTAB Abdomen - soft, non tender, + bowel sounds, PEG in place Extremities - 1+ edema Skin - no rashes Neuro - Opens eyes intermittently but does not follow any commands   Assessment & Plan:   Respiratory insufficiency - s/p intubation and ultimately required trach 11/26.  Now tolerating ATC. - Continue ATC. - Remove trach sutures 12/3 and can consider changing trach to cuffless at that point. - Permissive tachypnea to some extent given intracranial process. - OK to transfer to SDU status. - Await[place]  Central tachypnea related to intracranial process. - Monitor clinically. - No interventions required.  Acute intracranial hemorrhage with associated cerebral edema - unclear etiology but some concern for cerebral amyloid angiopathy in the setting of dementia + IV heparin. - Monitor clinically. - No interventions required.  Acute pulmonary embolism with bilateral lower leg DVTs - s/p filter placement. - Maintain IVC filter, likely definitive therapy.  Hypertension - well controlled on enteral medication. - Continue enteral antihypertensives.  Dysphagia. - Continue TF's.  Urinary retention. - Continue urecholine, dose increased from 5 to 10  on 12/2.  Best practice:  Diet: TF DVT prophylaxis: SCDs GI prophylaxis: pepcid Glucose control: SSI - control adequate Mobility: bedrest Code Status: Full Code Family: No family at bedside Disposition: family would like to arrange for transfer to [facility] in [place].  In the meantime, transfer  out of ICU to SDU.  Will ask [facility] to assume care starting AM 12/3 and PCCM off at that time.   Rutherford Guys, [place] - [facility] Pulmonary & Critical Care Medicine Pager: [phone]  or [phone] 08/05/2018, 9:07 AM

## 2018-08-06 ENCOUNTER — Inpatient Hospital Stay (HOSPITAL_COMMUNITY): Payer: Medicare Other

## 2018-08-06 LAB — GLUCOSE, CAPILLARY
GLUCOSE-CAPILLARY: 98 mg/dL (ref 70–99)
Glucose-Capillary: 126 mg/dL — ABNORMAL HIGH (ref 70–99)
Glucose-Capillary: 132 mg/dL — ABNORMAL HIGH (ref 70–99)
Glucose-Capillary: 160 mg/dL — ABNORMAL HIGH (ref 70–99)
Glucose-Capillary: 128 mg/dL — ABNORMAL HIGH (ref 70–99)
Glucose-Capillary: 120 mg/dL — ABNORMAL HIGH (ref 70–99)
Glucose-Capillary: 103 mg/dL — ABNORMAL HIGH (ref 70–99)
Glucose-Capillary: 125 mg/dL — ABNORMAL HIGH (ref 70–99)

## 2018-08-06 MED ORDER — DONEPEZIL HCL 10 MG PO TABS
10.0000 mg | ORAL_TABLET | Freq: Every day | ORAL | Status: DC
  Administered 2018-08-06 – 2018-08-08 (×3): 10 mg
  Filled 2018-08-06 (×4): qty 1

## 2018-08-06 NOTE — Progress Notes (Signed)
PCCM Interval Progress Note  Pt examined.  Continues to tolerated ATC well.  This morning, actually had O2 tubing off trach site and was essentially on room air with SpO2 of 100%.  Will remove sutures and change trach to #6 cuffless.  If continues to tolerate well then can hopefully progress to downsizing at a later time.  Continue ATC.  Nothing further to add.  PCCM will sign off.  Please do not hesitate to call us back if vent needs arise or if we can be of any further assistance.   Rutherford Guysahul Aria Jarrard, [place] - C [facility] Pulmonary & Critical Care Medicine Pager: [phone]  or [phone] 08/06/2018, 8:03 AM

## 2018-08-06 NOTE — Progress Notes (Signed)
[place] TEAM 1 - Stepdown/ICU TEAM  Hannah Holloway  ZDG:[id number] DOB: [birth year] DOA: 07/24/2018 PCP: Patient, No Pcp Per    Brief Narrative:  72 year old female with a hx dementia who presented with general weakness after being found down at home. Work-up revealed acute PE and bilateral lower extremities DVTs. Hospital course has been complicated by large left IPH and IVH with midline shift in the setting of anticoagulation.   Hospital Events: 11/20 admit w/ Acute PE and b/l lower leg DVTs 11/21 New ICH > ETT 11/25 IVC filter placed by IR; off cleviprex 11/26 trach -PEG tube placed 11/30 started on ATC 12/1 and 12/2 tolerated ATC throughout day 12/3 TRH assumed care  Subjective: The patient is having her bed changed by the staff at the time of my visit.  She is resting comfortably.  She will follow me around the room with her eyes.  There is no evidence of respiratory distress.  There is no evidence of uncontrolled pain.  I spoke with her sister at the bedside.  Assessment & Plan:  Acute resp failure s/p tracheostomy S/p trach on 11/26 - tolerating ATC w/o trouble - PCCM changed to #6 cuffless trach today - consider downsizing toward decannulation if continues to thrive   Central tachypnea related to intracranial process. No additional interventions required -was evaluated by neurosurgery  Acute pulmonary embolism w/ B LE DVTs IVC filter placed due to [place] -no plans for anticoagulation at this time given above  Acute intracranial hemorrhage associated with cerebral edema concern for cerebral amyloid angiopathy in the setting of dementia + IV heparin - no intervention required from NS standpoint   Dysphagia Continue tube feedings   HTN  Recurrent urinary retention  Attempts to remove foley have failed again, w/ pt requiring multiple episodes of I/O cath - foley replaced today - cont urecholine and give voiding trial again as becomes more mobile   DVT prophylaxis:  SCDs Code Status: FULL CODE Family Communication: As above Disposition Plan: SNF versus LTAC  Consultants:  Neurology Neurosurgery  Antimicrobials:  Rocephin 11/21 > 11/27 Azithromycin 11/21 > 11/27  Objective: Blood pressure (!) 137/56, pulse 83, temperature (!) 100.8 F (38.2 C), temperature source Axillary, resp. rate (!) 25, height _0  (1.676 m), weight 72 kg, SpO2 100 %.  Intake/Output Summary (Last 24 hours) at 08/06/2018 1620 Last data filed at 08/06/2018 1500 Gross per 24 hour  Intake 1213 ml  Output 1625 ml  Net -412 ml   Filed Weights   08/04/18 0431 08/05/18 0325 08/06/18 0200  Weight: 76 kg 74 kg 72 kg    Examination: General: No acute respiratory distress Lungs: Clear to auscultation bilaterally without wheezes or crackles Cardiovascular: Regular rate and rhythm without murmur gallop or rub normal S1 and S2 Abdomen: Nontender, nondistended, soft, bowel sounds positive, no rebound, no ascites, no appreciable mass Extremities: No significant cyanosis, clubbing, or edema bilateral lower extremities  CBC: Recent Labs  Lab 07/31/18 0427 08/01/18 0418 08/02/18 0347 08/03/18 0519 08/05/18 0756  WBC 7.6 7.2 7.6 7.3 5.9  NEUTROABS  --   --  6.1  --   --   HGB 10.2* 9.0* 8.7* 9.2* 10.9*  HCT 33.6* 30.0* 29.6* 30.0* 34.7*  MCV 98.0 97.7 98.7 97.1 93.3  PLT 139* 144* 148* 172 951   Basic Metabolic Panel: Recent Labs  Lab 07/31/18 0427  08/01/18 0418 08/01/18 1231 08/01/18 1850 08/02/18 0347 08/03/18 0519 08/05/18 0756  NA 151*   < > 151*  152* 152* 147* 143 138  K 3.8  --  3.6  --   --  4.0 4.2 4.0  CL 119*  --  122*  --   --  120* 110 105  CO2 27  --  24  --   --  _0 GLUCOSE 111*  --  169*  --   --  126* 148* 172*  BUN 15  --  14  --   --  _1 CREATININE 0.55  --  0.65  --   --  0.62 0.63 0.56  CALCIUM 9.0  --  8.5*  --   --  8.8* 9.3 8.8*  MG 2.4  --   --   --   --  2.1  --   --   PHOS 3.0  --   --   --   --  3.0  --   --    < > =  values in this interval not displayed.   GFR: Estimated Creatinine Clearance: 62.7 mL/min (by C-G formula based on SCr of 0.56 mg/dL).  Liver Function Tests: No results for input(s): AST, ALT, ALKPHOS, BILITOT, PROT, ALBUMIN in the last 168 hours. No results for input(s): LIPASE, AMYLASE in the last 168 hours. No results for input(s): AMMONIA in the last 168 hours.  Coagulation Profile: No results for input(s): INR, PROTIME in the last 168 hours.  Cardiac Enzymes: No results for input(s): CKTOTAL, CKMB, CKMBINDEX, TROPONINI in the last 168 hours.  HbA1C: Hgb A1c MFr Bld  Date/Time Value Ref Range Status  07/25/2018 05:28 AM 5.7 (H) 4.8 - 5.6 % Final    Comment:    (NOTE) Pre diabetes:          5.7%-6.4% Diabetes:              >6.4% Glycemic control for   <7.0% adults with diabetes     CBG: Recent Labs  Lab 08/05/18 2353 08/06/18 0356 08/06/18 0826 08/06/18 1207 08/06/18 1550  GLUCAP 128* 128* 120* 103* 98     Scheduled Meds: . amLODipine  10 mg Per Tube Daily  . bethanechol  10 mg Per Tube TID  . chlorhexidine gluconate (MEDLINE KIT)  15 mL Mouth Rinse BID  . donepezil  10 mg Oral QHS  . famotidine  20 mg Per Tube Q12H  . feeding supplement (PRO-STAT SUGAR FREE 64)  30 mL Per Tube Daily  . losartan  50 mg Per Tube BID  . mouth rinse  15 mL Mouth Rinse Q4H  . metoprolol tartrate  50 mg Per Tube BID  . sodium chloride flush  10-40 mL Intracatheter Q12H   Continuous Infusions: . sodium chloride Stopped (08/05/18 0819)  . feeding supplement (OSMOLITE 1.2 CAL) 1,000 mL (08/06/18 1525)     LOS: 12 days   Cherene Altes, MD [facility] Office  [phone] Pager - Text Page per Amion as per below:  On-Call/Text Page:      [email or web address]      password [email or web address]  If 7PM-7AM, please contact night-coverage [email or web address] Password [email or web address] 08/06/2018, 4:20 PM

## 2018-08-06 NOTE — Progress Notes (Signed)
Sutures removed from trach and #6 cuffed was attempted to be removed but met resistance so it was stopped. CCM PA to bedside for assistance. Tube exchanger inserted through #6 cuffed and it was removed and # 6 cuffless was inserted. Positive color change noted on etco2, bilateral BS. VS within normal limits. CXR pending. RT will continue to monitor

## 2018-08-06 NOTE — Progress Notes (Signed)
Referral made to [facility] [place], [place], per family's request.  [facility]; fax # [phone].  LTAC admissions phone # is [phone].  Will provide updates as available.    Quintella BatonJulie W. Auden Wettstein, RN, BSN  Trauma/Neuro ICU Case Manager [phone]

## 2018-08-06 NOTE — Progress Notes (Signed)
Physical Therapy Treatment Patient Details Name: Hannah Holloway MRN: [id number] DOB: March 04, 1946 Today's Date: 08/06/2018    History of Present Illness Hannah Holloway is a 72 y.o. female with history of dementia who was found down at home. [facility] and found to have bilateral PE on chest CT and bilateral DVT and lower extremities. She was put on heparin IV but then developed worsening mental status. Neuro imaging showing Intraventricular hemorrhage, and large areas of hemorrhage in the anterior left frontal lobe and posterior left frontal and parietal lobe resulting in significant mass effect with left-to-right midline shift up to 10 mm. Family interested in pursuing medical and rehab efforts; for IVC filter and gastrostomy tube by IR.  has no past medical history on file.    PT Comments    Pt very restless and unable to focus on task or therapist.  Emphasis on sitting balance and standing trial.  Had to return to supine due to excessively restless.   Follow Up Recommendations  LTACH;Supervision/Assistance - 24 hour     Equipment Recommendations  Other (comment)    Recommendations for Other Services       Precautions / Restrictions Precautions Precautions: Fall Precaution Comments: vent, trach, peg    Mobility  Bed Mobility Overal bed mobility: Needs Assistance Bed Mobility: Supine to Sit;Sit to Supine     Supine to sit: Total assist;+2 for physical assistance Sit to supine: Total assist;+2 for physical assistance   General bed mobility comments: PAD utilized to scoot to EOB  Transfers Overall transfer level: Needs assistance   Transfers: Sit to/from Stand Sit to Stand: Total assist;+2 physical assistance(s2)         General transfer comment: face to face  or 2 person to stand upright.  knees blocked.  Ambulation/Gait             General Gait Details: unable   Stairs             Wheelchair Mobility    Modified  Rankin (Stroke Patients Only) Modified Rankin (Stroke Patients Only) Pre-Morbid Rankin Score: No significant disability Modified Rankin: Severe disability     Balance Overall balance assessment: Needs assistance Sitting-balance support: Feet supported Sitting balance-Leahy Scale: Zero Sitting balance - Comments: sat EOB with max to total, w/bearing through bil UE's. pt very fidgety.                                    Cognition Arousal/Alertness: Lethargic Behavior During Therapy: Restless Overall Cognitive Status: Impaired/Different from baseline Area of Impairment: Following commands;Attention                   Current Attention Level: Focused   Following Commands: Follows one step commands inconsistently;Follows one step commands with increased time     Problem Solving: Decreased initiation        Exercises Other Exercises Other Exercises: warm up ROM/stretching to bil LE's    General Comments General comments (skin integrity, edema, etc.): VSS      Pertinent Vitals/Pain Pain Assessment: Faces Faces Pain Scale: No hurt    Home Living                      Prior Function            PT Goals (current goals can now be found in the care plan section) Acute Rehab PT  Goals Patient Stated Goal: Unable to state PT Goal Formulation: Patient unable to participate in goal setting Time For Goal Achievement: 08/12/18 Potential to Achieve Goals: Fair Progress towards PT goals: Progressing toward goals    Frequency    Min 2X/week      PT Plan Current plan remains appropriate    Co-evaluation              AM-PAC PT "6 Clicks" Mobility   Outcome Measure  Help needed turning from your back to your side while in a flat bed without using bedrails?: Total Help needed moving from lying on your back to sitting on the side of a flat bed without using bedrails?: Total Help needed moving to and from a bed to a chair (including a  wheelchair)?: Total Help needed standing up from a chair using your arms (e.g., wheelchair or bedside chair)?: Total Help needed to walk in hospital room?: Total Help needed climbing 3-5 steps with a railing? : Total 6 Click Score: 6    End of Session   Activity Tolerance: Patient tolerated treatment well;Patient limited by fatigue Patient left: in bed;with call bell/phone within reach;with family/visitor present Nurse Communication: Mobility status PT Visit Diagnosis: Other symptoms and signs involving the nervous system (R29.898);Hemiplegia and hemiparesis Hemiplegia - Right/Left: Right Hemiplegia - caused by: Nontraumatic intracerebral hemorrhage;Other Nontraumatic intracranial hemorrhage     Time: 1435-1500 PT Time Calculation (min) (ACUTE ONLY): 25 min  Charges:  $Therapeutic Activity: 23-37 mins                     08/06/2018  Danbury Bing, PT Acute Rehabilitation Services [phone]  (pager) [phone]  (office)   Hannah Holloway 08/06/2018, 5:42 PM

## 2018-08-07 LAB — CBC
HCT: 32 % — ABNORMAL LOW (ref 36.0–46.0)
Hemoglobin: 10.2 g/dL — ABNORMAL LOW (ref 12.0–15.0)
MCH: 29.6 pg (ref 26.0–34.0)
MCHC: 31.9 g/dL (ref 30.0–36.0)
MCV: 92.8 fL (ref 80.0–100.0)
Platelets: 290 10*3/uL (ref 150–400)
RBC: 3.45 MIL/uL — ABNORMAL LOW (ref 3.87–5.11)
RDW: 14.2 % (ref 11.5–15.5)
WBC: 8.3 10*3/uL (ref 4.0–10.5)
nRBC: 0 % (ref 0.0–0.2)

## 2018-08-07 LAB — COMPREHENSIVE METABOLIC PANEL
ALT: 450 U/L — ABNORMAL HIGH (ref 0–44)
AST: 133 U/L — ABNORMAL HIGH (ref 15–41)
Albumin: 2.8 g/dL — ABNORMAL LOW (ref 3.5–5.0)
Alkaline Phosphatase: 108 U/L (ref 38–126)
Anion gap: 11 (ref 5–15)
BUN: 27 mg/dL — ABNORMAL HIGH (ref 8–23)
CALCIUM: 9.7 mg/dL (ref 8.9–10.3)
CO2: 25 mmol/L (ref 22–32)
Chloride: 103 mmol/L (ref 98–111)
Creatinine, Ser: 0.9 mg/dL (ref 0.44–1.00)
Glucose, Bld: 177 mg/dL — ABNORMAL HIGH (ref 70–99)
Potassium: 4.8 mmol/L (ref 3.5–5.1)
Sodium: 139 mmol/L (ref 135–145)
Total Bilirubin: 0.6 mg/dL (ref 0.3–1.2)
Total Protein: 7.4 g/dL (ref 6.5–8.1)

## 2018-08-07 LAB — GLUCOSE, CAPILLARY
Glucose-Capillary: 121 mg/dL — ABNORMAL HIGH (ref 70–99)
Glucose-Capillary: 123 mg/dL — ABNORMAL HIGH (ref 70–99)
Glucose-Capillary: 139 mg/dL — ABNORMAL HIGH (ref 70–99)
Glucose-Capillary: 143 mg/dL — ABNORMAL HIGH (ref 70–99)
Glucose-Capillary: 162 mg/dL — ABNORMAL HIGH (ref 70–99)

## 2018-08-07 MED ORDER — AMLODIPINE BESYLATE 5 MG PO TABS
5.0000 mg | ORAL_TABLET | Freq: Every day | ORAL | Status: DC
  Administered 2018-08-08 – 2018-08-09 (×2): 5 mg
  Filled 2018-08-07 (×2): qty 1

## 2018-08-07 NOTE — Progress Notes (Addendum)
NCM faxed MAR To [facility] at [facility] at [phone], there is no MD progress note yet for today, once in system NCM will fax.   Also NCM trying to set up transportation with [facility] who could not take patient because patient needs to be on monitor.  NCM called [facility] they [facility] [place] [facility], they recommended to [facility] [phone],  [facility]CM contacted [facility] they can do transport on 12/5 at 8:30 pm.  This information was given to MD.  Will need medical necessity form, face sheet and emtala done and put on chart.  Per [employer] Medicare will pay for transport, patient does not have to pay. She is going to [facility] at  [address], [place] [id number], ICU bed 7.  The accepting MD is Dema SeverinHosea Delgado Nurse to call report to [phone].  NCM will need to fax dc summary and Mar to [phone].    12/4 1711 Letha Capeeborah Debria Broecker RN BSN - NCM spoke with Zola ButtonPaul Carter, he states he did not want her released so quickly he would like MD to call him.  NCM informed MD of this information.  12/5 Letha Capeeborah Aric Jost RN BSN-NCM contacted Zola ButtonPaul Carter and Myrtie NeitherArthur Carter and Dr. Margo AyeHall the attending to let them know that the transport will take place at 10 am on 12/6.   12/6 Letha Capeeborah Yuji Walth RN, BSN - NCM, confirmed transport at 10 am and confirmed with accepting facility.  Medical necessity on chart with dc sum and Mar and face sheet, awaiting EMTALA to be completed.

## 2018-08-07 NOTE — Consult Note (Signed)
I spoke with patient's brother, Dr. Myrtie NeitherArthur Carter.  He and his brother are concerned about skin breakdown with the patient's future rehabilitation/ long-term care.  They have requested suprapubic tube placement from Urology, and have raised the question of colostomy with General surgery.    I explain to Dr. Montez Moritaarter that catheter placement will not change bladder function long-term.  Any bladder function will be dictated by the patient's neurologic insult/status.  I let him know that catheter placed all not affect the ability of the bladder to hold or empty long-term with it being in place.  I also explained that if bladder drainage is appropriate with a closed urinary drainage system such as a Foley catheter, there should be no perineal contamination of urine, and no worry about skin breakdown from urine leakage.  Suprapubic tube placement will not influence leakage either, as she will still have an open urethra for passage of possible urine with bladder spasms.  I do not feel it reasonable, especially in an acute care setting, to put this woman through an intervention that is not necessary.  If long-term care still provided this woman in a few weeks/months, and the family would like to revisit suprapubic tube placement, I asked him to call our office for outpatient consultation.

## 2018-08-07 NOTE — Care Management Important Message (Signed)
Important Message  Patient Details  Name: Hannah Holloway MRN: [id number] Date of Birth: 11-Nov-1945   Medicare Important Message Given:  Yes    Amiel Mccaffrey 08/07/2018, 1:48 PM

## 2018-08-07 NOTE — Progress Notes (Addendum)
Occupational Therapy Treatment Patient Details Name: Hannah Holloway MRN: [id number] DOB: [birth year] Today's Date: 08/07/2018    History of present illness Hannah Holloway is a 72 y.o. female with history of dementia who was found down at home. She was admitted to [facility] and found to have bilateral PE on chest CT and bilateral DVT and lower extremities. She was put on heparin IV but then developed worsening mental status. Neuro imaging showing Intraventricular hemorrhage, and large areas of hemorrhage in the anterior left frontal lobe and posterior left frontal and parietal lobe resulting in significant mass effect with left-to-right midline shift up to 10 mm. Family interested in pursuing medical and rehab efforts; for IVC filter and gastrostomy tube by IR.  has no past medical history on file.   OT comments  Pt continue to present with decreased occupational performance and participation. Pt lethargic and requiring increased stimuli to open her eyes. Pt requiring Total A for bed mobility. Sitting at EOB, pt performing oral care with Total A. Nephew arriving at end of session and updating him on session; RN present. Continue to recommend dc to [facility] and will continue to follow acutely as admitted.    Follow Up Recommendations  LTACH;Supervision/Assistance - 24 hour    Equipment Recommendations  Other (comment)(TBD at next venue of care)    Recommendations for Other Services      Precautions / Restrictions Precautions Precautions: Fall Precaution Comments: vent, trach, peg Restrictions Weight Bearing Restrictions: No       Mobility Bed Mobility Overal bed mobility: Needs Assistance Bed Mobility: Supine to Sit;Sit to Supine     Supine to sit: Total assist;+2 for physical assistance Sit to supine: Total assist;+2 for physical assistance   General bed mobility comments: Total A to reach EOB and return to supine. Use of pad for managing hips  Transfers                       Balance Overall balance assessment: Needs assistance Sitting-balance support: Feet supported Sitting balance-Leahy Scale: Zero Sitting balance - Comments: sat EOB with max to total, w/bearing through bil UE's. pt very fidgety. Postural control: Posterior lean;Right lateral lean                                 ADL either performed or assessed with clinical judgement   ADL Overall ADL's : Needs assistance/impaired     Grooming: Total assistance;Sitting;Oral care Grooming Details (indicate cue type and reason): Pt sitting at EOB with Max A. Requiring Total A to participate in oral care.                                General ADL Comments: Pt continue to require Total A for ADLs and bed mobility     Vision       Perception     Praxis      Cognition Arousal/Alertness: Lethargic Behavior During Therapy: Flat affect Overall Cognitive Status: Impaired/Different from baseline Area of Impairment: Following commands;Attention;Problem solving                   Current Attention Level: Focused Memory: (noted history of dementia) Following Commands: Follows one step commands inconsistently;Follows one step commands with increased time     Problem Solving: Decreased initiation;Requires verbal cues;Requires tactile cues General Comments: Opening eyes with increased  stimuli including cold wash clothe and pain stimuli. Not following commands or making eye contact        Exercises Exercises: Other exercises Other Exercises Other Exercises: trunk extension with Total A and assist to elevate head   Shoulder Instructions       General Comments SpO2 100 on 4.5 L. HR 90s. BP 103/55 before mobility and 100/65 after mobility.    Pertinent Vitals/ Pain       Pain Assessment: Faces Faces Pain Scale: No hurt Pain Location: grimace with noxious stimuli to bil LE, also grimace with full LLE knee extension in sitting Pain Intervention(s):  Monitored during session  Home Living                                          Prior Functioning/Environment              Frequency  Min 2X/week        Progress Toward Goals  OT Goals(current goals can now be found in the care plan section)  Progress towards OT goals: Not progressing toward goals - comment  Acute Rehab OT Goals Patient Stated Goal: Unable to state OT Goal Formulation: Patient unable to participate in goal setting Time For Goal Achievement: 08/12/18 Potential to Achieve Goals: Fair ADL Goals Pt Will Perform Grooming: with mod assist;sitting Pt Will Perform Upper Body Bathing: with mod assist;sitting;bed level Additional ADL Goal #1: Pt will complete bed moblity with mod assist +2 and maintain sitting EOB with mod assist for 5 minutes as precursor for ADLs. Additional ADL Goal #2: Pt will purposefully use L UE during 3/5 tasks with max cueing.  Plan Discharge plan remains appropriate    Co-evaluation                 AM-PAC OT "6 Clicks" Daily Activity     Outcome Measure   Help from another person eating meals?: Total Help from another person taking care of personal grooming?: Total Help from another person toileting, which includes using toliet, bedpan, or urinal?: Total Help from another person bathing (including washing, rinsing, drying)?: Total Help from another person to put on and taking off regular upper body clothing?: Total Help from another person to put on and taking off regular lower body clothing?: Total 6 Click Score: 6    End of Session Equipment Utilized During Treatment: Oxygen  OT Visit Diagnosis: Other abnormalities of gait and mobility (R26.89);Other symptoms and signs involving the nervous system (R29.898);Other symptoms and signs involving cognitive function   Activity Tolerance Patient limited by lethargy   Patient Left in bed;with call bell/phone within reach;with bed alarm set;with restraints  reapplied   Nurse Communication Mobility status        Time: [id number] OT Time Calculation (min): 22 min  Charges: OT General Charges $OT Visit: 1 Visit OT Treatments $Self Care/Home Management : 8-22 mins  Hannah Holloway MSOT, OTR/L Acute Rehab Pager: [phone] Office: [phone]   Theodoro GristCharis M Cayce Holloway 08/07/2018, 3:22 PM

## 2018-08-07 NOTE — Progress Notes (Addendum)
PROGRESS NOTE  Hannah Holloway FUX:[id number] DOB: 10-Jul-1946 DOA: 07/24/2018 PCP: Patient, No Pcp Per  HPI/Recap of past 24 hours: Brief Narrative:  72 year old female with a hx dementia who presented with general weakness after being found down at home. Work-up revealed acute PE and bilateral lower extremities DVTs. Hospital course has been complicated by large left IPH and IVH with midline shift in the setting of anticoagulation.   Hospital Events: 11/20 admit w/ Acute PE and b/l lower leg DVTs 11/21 New ICH > ETT 11/25 IVC filter placed by IR; off cleviprex 11/26 trach -PEG tube placed 11/30 started on ATC 12/1 and 12/2 tolerated ATC throughout day 12/3 TRH assumed care  Subjective: Seen and examined at her bedside. She does not follow commands. She has non purposeful movements in her left arm. Does not appear in distress. Talked to her 2 brothers who are MDs. They requested suprapubic cystostomy and laparoscopic descending colostomy to avoid skin breakdowns. Called and spoke to urologist on call Dr Diona Fanti and gen surgery on call about the request. No plan for any procedure at this time. They will discuss with the brothers: Dr Marily Memos [phone] and the Gen surgeon brother [phone] or [phone].  Assessment/Plan: Active Problems:   Elevated troponin   Acute metabolic encephalopathy   Dementia (HCC)   Dehydration   UTI (urinary tract infection)   Pulmonary emboli (HCC)   Multifocal pneumonia   Acute cerebral hemorrhage (HCC)   Intraparenchymal hemorrhage of brain (HCC)   Intraventricular hemorrhage (HCC)   Intubation of airway performed without difficulty   Acute deep vein thrombosis (DVT) of proximal vein of both lower extremities (HCC)   Acute respiratory failure (HCC)   Tracheostomy status ([place])  Acute resp failure s/p tracheostomy S/p trach on 11/26 - tolerating ATC w/o trouble - PCCM changed to #6 cuffless trach today - consider downsizing toward  decannulation if continues to thrive   Acute metabolic encephalopathy 2/2 to intracranial hemorrhage Poor prognosis in the setting of dementia, recent PE, B/L LE DVT, and advanced age   Central tachypnea related to intracranial process. No additional interventions required -was evaluated by neurosurgery  Acute pulmonary embolism w/ B LE DVTs IVC filter placed due to [place] -no plans for anticoagulation at this time given above  Acute intracranial hemorrhage associated with cerebral edema concern for cerebral amyloid angiopathy in the setting of dementia + IV heparin - no intervention required from NS standpoint  Independently reviewed CT head and MRI which revealed intracranial hemorrhage in left and right frontal lobes  Dysphagia Continue tube feedings   HTN, BP is soft At home on amlodipine, losartan and metoprolol Hold off losartan and metoprolol Reduce dose of amlodipine to 5 mg daily  Recurrent urinary retention  Tolerating purewick  DVT prophylaxis: SCDs Code Status: FULL CODE Family Communication: As above Disposition Plan: LTAC tomorrow  Consultants:  Neurology Neurosurgery  Antimicrobials:  Rocephin 11/21 > 11/27 Azithromycin 11/21 > 11/27     Objective: Vitals:   08/07/18 0759 08/07/18 0803 08/07/18 1145 08/07/18 1209  BP: (!) 94/54 (!) 94/54 (!) 103/55   Pulse: 91 90 97 93  Resp: (!) 24 (!) _0 Temp:  98 F (36.7 C)    TempSrc:  Axillary    SpO2: 100% 100% 100% 100%  Weight:      Height:        Intake/Output Summary (Last 24 hours) at 08/07/2018 1438 Last data filed at 08/07/2018 0800 Gross per 24 hour  Intake 1500 ml  Output 1050 ml  Net 450 ml   Filed Weights   08/05/18 0325 08/06/18 0200 08/07/18 0559  Weight: 74 kg 72 kg 73 kg    Exam:  . General: 72 y.o. year-old female well developed well nourished in no acute distress. Somnolent does not follow commands;.non purposeful movement to her left arm. . Cardiovascular: Regular  rate and rhythm with no rubs or gallops.  No thyromegaly or JVD noted.   Marland Kitchen Respiratory: Mild rales bilaterally with no wheezes. Poor inspiratory effort. . Abdomen: Soft nontender nondistended with normal bowel sounds x4 quadrants. Peg tube in place. . Musculoskeletal: Trace lower extremity edema. 2/4 pulses in all 4 extremities. Marland Kitchen Psychiatry: unable to assess due to non responsiveness  Data Reviewed: CBC: Recent Labs  Lab 08/01/18 0418 08/02/18 0347 08/03/18 0519 08/05/18 0756 08/07/18 0311  WBC 7.2 7.6 7.3 5.9 8.3  NEUTROABS  --  6.1  --   --   --   HGB 9.0* 8.7* 9.2* 10.9* 10.2*  HCT 30.0* 29.6* 30.0* 34.7* 32.0*  MCV 97.7 98.7 97.1 93.3 92.8  PLT 144* 148* 172 245 315   Basic Metabolic Panel: Recent Labs  Lab 08/01/18 0418  08/01/18 1850 08/02/18 0347 08/03/18 0519 08/05/18 0756 08/07/18 0311  NA 151*   < > 152* 147* 143 138 139  K 3.6  --   --  4.0 4.2 4.0 4.8  CL 122*  --   --  120* 110 105 103  CO2 24  --   --  _0 GLUCOSE 169*  --   --  126* 148* 172* 177*  BUN 14  --   --  _1 27*  CREATININE 0.65  --   --  0.62 0.63 0.56 0.90  CALCIUM 8.5*  --   --  8.8* 9.3 8.8* 9.7  MG  --   --   --  2.1  --   --   --   PHOS  --   --   --  3.0  --   --   --    < > = values in this interval not displayed.   GFR: Estimated Creatinine Clearance: 56.1 mL/min (by C-G formula based on SCr of 0.9 mg/dL). Liver Function Tests: Recent Labs  Lab 08/07/18 0311  AST 133*  ALT 450*  ALKPHOS 108  BILITOT 0.6  PROT 7.4  ALBUMIN 2.8*   No results for input(s): LIPASE, AMYLASE in the last 168 hours. No results for input(s): AMMONIA in the last 168 hours. Coagulation Profile: No results for input(s): INR, PROTIME in the last 168 hours. Cardiac Enzymes: No results for input(s): CKTOTAL, CKMB, CKMBINDEX, TROPONINI in the last 168 hours. BNP (last 3 results) No results for input(s): PROBNP in the last 8760 hours. HbA1C: No results for input(s): HGBA1C in the last  72 hours. CBG: Recent Labs  Lab 08/06/18 2008 08/07/18 0030 08/07/18 0310 08/07/18 0802 08/07/18 1144  GLUCAP 125* 139* 162* 121* 123*   Lipid Profile: No results for input(s): CHOL, HDL, LDLCALC, TRIG, CHOLHDL, LDLDIRECT in the last 72 hours. Thyroid Function Tests: No results for input(s): TSH, T4TOTAL, FREET4, T3FREE, THYROIDAB in the last 72 hours. Anemia Panel: No results for input(s): VITAMINB12, FOLATE, FERRITIN, TIBC, IRON, RETICCTPCT in the last 72 hours. Urine analysis:    Component Value Date/Time   COLORURINE YELLOW 07/25/2018 0005   APPEARANCEUR HAZY (A) 07/25/2018 0005   LABSPEC 1.023 07/25/2018 0005   PHURINE  5.0 07/25/2018 0005   GLUCOSEU NEGATIVE 07/25/2018 0005   HGBUR LARGE (A) 07/25/2018 0005   BILIRUBINUR NEGATIVE 07/25/2018 0005   KETONESUR 20 (A) 07/25/2018 0005   PROTEINUR 100 (A) 07/25/2018 0005   NITRITE NEGATIVE 07/25/2018 0005   LEUKOCYTESUR MODERATE (A) 07/25/2018 0005   Sepsis Labs: _0 (procalcitonin:4,lacticidven:4)  )No results found for this or any previous visit (from the past 240 hour(s)).    Studies: No results found.  Scheduled Meds: . amLODipine  10 mg Per Tube Daily  . bethanechol  10 mg Per Tube TID  . chlorhexidine gluconate (MEDLINE KIT)  15 mL Mouth Rinse BID  . donepezil  10 mg Per Tube QHS  . famotidine  20 mg Per Tube Q12H  . feeding supplement (PRO-STAT SUGAR FREE 64)  30 mL Per Tube Daily  . losartan  50 mg Per Tube BID  . mouth rinse  15 mL Mouth Rinse Q4H  . metoprolol tartrate  50 mg Per Tube BID  . sodium chloride flush  10-40 mL Intracatheter Q12H    Continuous Infusions: . feeding supplement (OSMOLITE 1.2 CAL) 1,000 mL (08/07/18 0533)     LOS: 13 days     Kayleen Memos, MD [facility] Pager [phone]  If 7PM-7AM, please contact night-coverage [email or web address] Password [facility] 08/07/2018, 2:38 PM

## 2018-08-08 LAB — CBC WITH DIFFERENTIAL/PLATELET
Abs Immature Granulocytes: 0.05 10*3/uL (ref 0.00–0.07)
BASOS ABS: 0 10*3/uL (ref 0.0–0.1)
Basophils Relative: 0 %
EOS PCT: 1 %
Eosinophils Absolute: 0.1 10*3/uL (ref 0.0–0.5)
HCT: 29.8 % — ABNORMAL LOW (ref 36.0–46.0)
Hemoglobin: 9.6 g/dL — ABNORMAL LOW (ref 12.0–15.0)
Immature Granulocytes: 1 %
Lymphocytes Relative: 15 %
Lymphs Abs: 1 10*3/uL (ref 0.7–4.0)
MCH: 29.9 pg (ref 26.0–34.0)
MCHC: 32.2 g/dL (ref 30.0–36.0)
MCV: 92.8 fL (ref 80.0–100.0)
Monocytes Absolute: 1 10*3/uL (ref 0.1–1.0)
Monocytes Relative: 15 %
NRBC: 0 % (ref 0.0–0.2)
Neutro Abs: 4.5 10*3/uL (ref 1.7–7.7)
Neutrophils Relative %: 68 %
Platelets: 300 10*3/uL (ref 150–400)
RBC: 3.21 MIL/uL — ABNORMAL LOW (ref 3.87–5.11)
RDW: 14.2 % (ref 11.5–15.5)
WBC: 6.6 10*3/uL (ref 4.0–10.5)

## 2018-08-08 LAB — GLUCOSE, CAPILLARY
Glucose-Capillary: 128 mg/dL — ABNORMAL HIGH (ref 70–99)
Glucose-Capillary: 129 mg/dL — ABNORMAL HIGH (ref 70–99)
Glucose-Capillary: 136 mg/dL — ABNORMAL HIGH (ref 70–99)
Glucose-Capillary: 142 mg/dL — ABNORMAL HIGH (ref 70–99)
Glucose-Capillary: 147 mg/dL — ABNORMAL HIGH (ref 70–99)
Glucose-Capillary: 149 mg/dL — ABNORMAL HIGH (ref 70–99)
Glucose-Capillary: 150 mg/dL — ABNORMAL HIGH (ref 70–99)

## 2018-08-08 LAB — COMPREHENSIVE METABOLIC PANEL
ALBUMIN: 2.6 g/dL — AB (ref 3.5–5.0)
ALT: 328 U/L — ABNORMAL HIGH (ref 0–44)
AST: 108 U/L — ABNORMAL HIGH (ref 15–41)
Alkaline Phosphatase: 94 U/L (ref 38–126)
Anion gap: 10 (ref 5–15)
BUN: 25 mg/dL — ABNORMAL HIGH (ref 8–23)
CO2: 27 mmol/L (ref 22–32)
Calcium: 9.3 mg/dL (ref 8.9–10.3)
Chloride: 103 mmol/L (ref 98–111)
Creatinine, Ser: 0.65 mg/dL (ref 0.44–1.00)
GFR calc Af Amer: >60 mL/min (ref >60)
GFR calc non Af Amer: >60 mL/min (ref >60)
Glucose, Bld: 187 mg/dL — ABNORMAL HIGH (ref 70–99)
Potassium: 4.5 mmol/L (ref 3.5–5.1)
Sodium: 140 mmol/L (ref 135–145)
Total Bilirubin: 0.7 mg/dL (ref 0.3–1.2)
Total Protein: 7.4 g/dL (ref 6.5–8.1)

## 2018-08-08 LAB — MAGNESIUM: Magnesium: 2.1 mg/dL (ref 1.7–2.4)

## 2018-08-08 LAB — PHOSPHORUS: Phosphorus: 3 mg/dL (ref 2.5–4.6)

## 2018-08-08 NOTE — Progress Notes (Signed)
Nutrition Follow Up  DOCUMENTATION CODES:   Not applicable  INTERVENTION:    Osmolite 1.2 at goal rate of 55 ml/hr with Prostat 30 ml daily  Provides 1684 kcals, 88 gm protein, 1070 ml of free water daily  NUTRITION DIAGNOSIS:   Inadequate oral intake related to inability to eat as evidenced by NPO status, ongoing   GOAL:   Patient will meet greater than or equal to 90% of their needs, met   MONITOR:   TF tolerance, Labs, Skin, Weight trends, I & O's  ASSESSMENT:   Pt with PMH of dementia who was found down at home. She was admitted to [facility] with acute PE secondary to LE DVT from immobility. Pt started on heparin and developed a large L IVH with 10 mm shift.    11/26 s/p trach & PEG tube placement 12/03 transferred out of 4N ICU to 2[facility]  Pt continues on trach collar. Her eyes are open but she does not follow commands. Osmolite 1.2 infusing at goal rate of 55 ml/hr via PEG tube.  Spoke with Lenna Sciara, RN. Pt tolerating TF at this time. Labs & medications reviewed.  CBG's [id number].  Family requesting LTACH in [place], [place].  Diet Order:   Diet Order    None     EDUCATION NEEDS:   No education needs have been identified at this time  Skin:  Skin Assessment: Reviewed RN Assessment  Last BM:  12/5   Intake/Output Summary (Last 24 hours) at 08/08/2018 1239 Last data filed at 08/08/2018 0955 Gross per 24 hour  Intake 1235 ml  Output 1950 ml  Net -715 ml   Height:   Ht Readings from Last 1 Encounters:  07/24/18 '5\' 6"'$  (1.676 m)   Weight:   Wt Readings from Last 1 Encounters:  08/08/18 73 kg   Ideal Body Weight:  59 kg  BMI:  Body mass index is 25.98 kg/m.  Estimated Nutritional Needs:   Kcal:  1650-1850  Protein:  87-100 grams  Fluid:  >1.7 L/day  Arthur Holms, RD, LDN Pager #: [phone] After-Hours Pager #: [phone]

## 2018-08-08 NOTE — Progress Notes (Signed)
  Speech Language Pathology Treatment: Hannah Holloway Speaking valve  Patient Details Name: Hannah Holloway MRN: [id number] DOB: 08-Jan-1946 Today's Date: 08/08/2018 Time: [id number] SLP Time Calculation (min) (ACUTE ONLY): 10 min  Assessment / Plan / Recommendation Clinical Impression  Pt poorly responsive.  Opened her eyes briefly to nonverbal auditory stimulation (clapping, snapping) but not to hearing her name.  PMV placed, but pt unable to follow any commands nor responsive to cues in an attempt to elicit purposeful voicing.  Briefly and intermittently, she smiled and laughed quietly, engaging vocal fold movement.  Her Sp02 and HR remained stable for 10 minute period of wearing PMV.  Intermittent removal of valve did not result in expulsion of air from trach, so pt appeared to be passively utilizing upper airway for exhalation.   SLP will continue efforts to address verbalization/phonation, access to upper airway for spontaneous cough, and swallowing when it is appropriate.      HPI HPI: 72 yr old female who was admitted on 07/24/18 for mgmt of Acute PE secondary to lower ext thrombosis from immobility started on Heparin gtt for anticoagulation. Experienced acute change in mental status while in MRI where pt was found to have a large left sided IPH and IVH with midline shift 10 mm. Pt has central tachypnea due to intracranial process. Intubated 11/21 to 11/26, trach placed 11/26, trach collar 12/1.       SLP Plan  Continue with current plan of care       Recommendations         Patient may use Passy-Holloway Speech Valve: with SLP only PMSV Supervision: Full         Oral Care Recommendations: Oral care QID Follow up Recommendations: Skilled Nursing facility SLP Visit Diagnosis: Dysphagia, oropharyngeal phase (R13.12) Plan: Continue with current plan of care       GO                Blenda MountsCouture, Hannah Holloway 08/08/2018, 2:05 PM  Hannah Bihl L. Samson Fredericouture, MA CCC/SLP Acute  Rehabilitation Services Office number [phone] Pager [phone]

## 2018-08-08 NOTE — Progress Notes (Signed)
PROGRESS NOTE  Hannah Holloway KWI:[id number] DOB: 1945-11-07 DOA: 07/24/2018 PCP: Patient, No Pcp Per  HPI/Recap of past 24 hours: Brief Narrative:  72 year old female with a hx dementia who presented with general weakness after being found down at home. Work-up revealed acute PE and bilateral lower extremities DVTs. Hospital course has been complicated by large left IPH and IVH with midline shift in the setting of anticoagulation.   Hospital Events: 11/20 admit w/ Acute PE and b/l lower leg DVTs 11/21 New ICH > ETT 11/25 IVC filter placed by IR; off cleviprex 11/26 trach -PEG tube placed 11/30 started on ATC 12/1 and 12/2 tolerated ATC throughout day 12/3 TRH assumed care  Subjective: Patient was seen and examined at her bedside.  No acute events overnight.  She does not follow commands.  She has no purposeful movements in her left arm.  She does not appear in distress.  Plan is to transfer to [facility] when bed and transportation are available.  Case manager is working on placement.   Her next of kins, brothers: Dr Marily Memos (orthopedic surgeon)#[phone] and Lenice Llamas (Gen surgeon)#[phone] or [phone].  Assessment/Plan: Active Problems:   Elevated troponin   Acute metabolic encephalopathy   Dementia (HCC)   Dehydration   UTI (urinary tract infection)   Pulmonary emboli (HCC)   Multifocal pneumonia   Acute cerebral hemorrhage (HCC)   Intraparenchymal hemorrhage of brain (HCC)   Intraventricular hemorrhage (HCC)   Intubation of airway performed without difficulty   Acute deep vein thrombosis (DVT) of proximal vein of both lower extremities (HCC)   Acute respiratory failure (HCC)   Tracheostomy status ([place])  Acute resp failure s/p tracheostomy S/p trach on 11/26 - tolerating ATC w/o trouble - PCCM changed to #6 cuffless trach  Acute metabolic encephalopathy 2/2 to intracranial hemorrhage Poor prognosis in the setting of dementia, recent PE, B/L LE DVT, and  advanced age   Central tachypnea related to intracranial process. No additional interventions required -was evaluated by neurosurgery  Acute pulmonary embolism w/ B LE DVTs IVC filter placed due to [place] -no plans for anticoagulation at this time given above  Acute intracranial hemorrhage associated with cerebral edema concern for cerebral amyloid angiopathy in the setting of dementia + IV heparin - no intervention required from NS standpoint  Independently reviewed CT head and MRI which revealed intracranial hemorrhage in left and right frontal lobes  Dysphagia Continue tube feedings  Maintain head of bed above 30 degree during her tube feeding to avoid aspiration  HTN, blood pressure is stable At home on amlodipine, losartan and metoprolol Hold off losartan and metoprolol Reduce dose of amlodipine to 5 mg daily Continue amlodipine 5 mg daily  Recurrent urinary retention  Tolerating purewick  DVT prophylaxis: SCDs Code Status: FULL CODE Family Communication:  Updated her 2 brothers Dr. Rema Fendt and Dr. Lenice Llamas.  Dr. Lenice Llamas who is also her medical power of attorney is agreeable for transfer to [place] near his work facility. Disposition Plan: LTAC possibly tomorrow 08/09/18  Consultants:  Neurology Neurosurgery  Antimicrobials:  Rocephin 11/21 > 11/27 Azithromycin 11/21 > 11/27     Objective: Vitals:   08/08/18 0739 08/08/18 0758 08/08/18 1141 08/08/18 1210  BP: 113/69 113/69  122/62  Pulse:  90 100   Resp:  18 18   Temp: 98.7 F (37.1 C)   99.5 F (37.5 C)  TempSrc: Axillary   Axillary  SpO2: 100%  100% 100%  Weight:  Height:        Intake/Output Summary (Last 24 hours) at 08/08/2018 1501 Last data filed at 08/08/2018 1446 Gross per 24 hour  Intake 1235 ml  Output 2250 ml  Net -1015 ml   Filed Weights   08/06/18 0200 08/07/18 0559 08/08/18 0500  Weight: 72 kg 73 kg 73 kg    Exam:  . General: 72 y.o. year-old female well-developed  well-nourished in no acute distress.  Somnolent does not open her eyes and does not follow commands.  No purposeful movement to her left arm. . Cardiovascular: Regular rate and rhythm with no rubs or gallops.  No JVD or thyromegaly noted..   . Respiratory: Mild rales at bases with no wheezes.  Poor inspiratory effort.  .  Abdomen: Soft nontender nondistended with normal bowel sounds x4 quadrants.  PEG tube in place.   . Musculoskeletal: Trace lower extremity edema. 2/4 pulses in all 4 extremities. Non purposefully moves her left arm. Marland Kitchen Psychiatry: unable to assess due to minimal responsiveness.  Data Reviewed: CBC: Recent Labs  Lab 08/02/18 0347 08/03/18 0519 08/05/18 0756 08/07/18 0311 08/08/18 0708  WBC 7.6 7.3 5.9 8.3 6.6  NEUTROABS 6.1  --   --   --  4.5  HGB 8.7* 9.2* 10.9* 10.2* 9.6*  HCT 29.6* 30.0* 34.7* 32.0* 29.8*  MCV 98.7 97.1 93.3 92.8 92.8  PLT 148* 172 245 290 086   Basic Metabolic Panel: Recent Labs  Lab 08/02/18 0347 08/03/18 0519 08/05/18 0756 08/07/18 0311 08/08/18 0708  NA 147* 143 138 139 140  K 4.0 4.2 4.0 4.8 4.5  CL 120* 110 105 103 103  CO2 _0 GLUCOSE 126* 148* 172* 177* 187*  BUN _1 27* 25*  CREATININE 0.62 0.63 0.56 0.90 0.65  CALCIUM 8.8* 9.3 8.8* 9.7 9.3  MG 2.1  --   --   --  2.1  PHOS 3.0  --   --   --  3.0   GFR: Estimated Creatinine Clearance: 63.1 mL/min (by C-G formula based on SCr of 0.65 mg/dL). Liver Function Tests: Recent Labs  Lab 08/07/18 0311 08/08/18 0708  AST 133* 108*  ALT 450* 328*  ALKPHOS 108 94  BILITOT 0.6 0.7  PROT 7.4 7.4  ALBUMIN 2.8* 2.6*   No results for input(s): LIPASE, AMYLASE in the last 168 hours. No results for input(s): AMMONIA in the last 168 hours. Coagulation Profile: No results for input(s): INR, PROTIME in the last 168 hours. Cardiac Enzymes: No results for input(s): CKTOTAL, CKMB, CKMBINDEX, TROPONINI in the last 168 hours. BNP (last 3 results) No results for input(s):  PROBNP in the last 8760 hours. HbA1C: No results for input(s): HGBA1C in the last 72 hours. CBG: Recent Labs  Lab 08/07/18 2001 08/08/18 0002 08/08/18 0358 08/08/18 0740 08/08/18 1213  GLUCAP 143* 147* 149* 150* 128*   Lipid Profile: No results for input(s): CHOL, HDL, LDLCALC, TRIG, CHOLHDL, LDLDIRECT in the last 72 hours. Thyroid Function Tests: No results for input(s): TSH, T4TOTAL, FREET4, T3FREE, THYROIDAB in the last 72 hours. Anemia Panel: No results for input(s): VITAMINB12, FOLATE, FERRITIN, TIBC, IRON, RETICCTPCT in the last 72 hours. Urine analysis:    Component Value Date/Time   COLORURINE YELLOW 07/25/2018 0005   APPEARANCEUR HAZY (A) 07/25/2018 0005   LABSPEC 1.023 07/25/2018 0005   PHURINE 5.0 07/25/2018 0005   GLUCOSEU NEGATIVE 07/25/2018 0005   HGBUR LARGE (A) 07/25/2018 0005   BILIRUBINUR NEGATIVE 07/25/2018 0005  KETONESUR 20 (A) 07/25/2018 0005   PROTEINUR 100 (A) 07/25/2018 0005   NITRITE NEGATIVE 07/25/2018 0005   LEUKOCYTESUR MODERATE (A) 07/25/2018 0005   Sepsis Labs: _0 (procalcitonin:4,lacticidven:4)  )No results found for this or any previous visit (from the past 240 hour(s)).    Studies: No results found.  Scheduled Meds: . amLODipine  5 mg Per Tube Daily  . bethanechol  10 mg Per Tube TID  . chlorhexidine gluconate (MEDLINE KIT)  15 mL Mouth Rinse BID  . donepezil  10 mg Per Tube QHS  . famotidine  20 mg Per Tube Q12H  . feeding supplement (PRO-STAT SUGAR FREE 64)  30 mL Per Tube Daily  . mouth rinse  15 mL Mouth Rinse Q4H  . sodium chloride flush  10-40 mL Intracatheter Q12H    Continuous Infusions: . feeding supplement (OSMOLITE 1.2 CAL) 1,000 mL (08/07/18 2333)     LOS: 14 days     Kayleen Memos, MD [facility] Pager [phone]  If 7PM-7AM, please contact night-coverage [email or web address] Password [email or web address] 08/08/2018, 3:01 PM

## 2018-08-09 LAB — GLUCOSE, CAPILLARY
Glucose-Capillary: 143 mg/dL — ABNORMAL HIGH (ref 70–99)
Glucose-Capillary: 179 mg/dL — ABNORMAL HIGH (ref 70–99)

## 2018-08-09 MED ORDER — ONDANSETRON HCL 4 MG PO TABS: 4.0000 mg | ORAL_TABLET | Freq: Four times a day (QID) | ORAL | Status: DC | PRN

## 2018-08-09 MED ORDER — CHLORHEXIDINE GLUCONATE 0.12% ORAL RINSE (MEDLINE KIT): 15.0000 mL | Freq: Two times a day (BID) | OROMUCOSAL | 0 refills | Status: AC

## 2018-08-09 MED ORDER — SODIUM CHLORIDE 0.9% FLUSH: 10.0000 mL | Freq: Two times a day (BID) | INTRAVENOUS | Status: AC

## 2018-08-09 MED ORDER — ONDANSETRON HCL 4 MG PO TABS: 4.0000 mg | ORAL_TABLET | Freq: Four times a day (QID) | ORAL | 0 refills | Status: AC | PRN

## 2018-08-09 MED ORDER — IPRATROPIUM-ALBUTEROL 0.5-2.5 (3) MG/3ML IN SOLN: 3.0000 mL | RESPIRATORY_TRACT | Status: AC | PRN

## 2018-08-09 MED ORDER — OSMOLITE 1.2 CAL PO LIQD: 1000.0000 mL | ORAL | 0 refills | Status: AC

## 2018-08-09 MED ORDER — DONEPEZIL HCL 10 MG PO TABS: 10.0000 mg | ORAL_TABLET | Freq: Every day | ORAL | Status: AC

## 2018-08-09 MED ORDER — ACETAMINOPHEN 325 MG PO TABS: 650.0000 mg | ORAL_TABLET | Freq: Four times a day (QID) | ORAL | Status: AC | PRN

## 2018-08-09 MED ORDER — SODIUM CHLORIDE 0.9% FLUSH: 10.0000 mL | INTRAVENOUS | Status: AC | PRN

## 2018-08-09 MED ORDER — BETHANECHOL CHLORIDE 10 MG PO TABS: 10.0000 mg | ORAL_TABLET | Freq: Three times a day (TID) | ORAL | Status: AC

## 2018-08-09 MED ORDER — PRO-STAT SUGAR FREE PO LIQD: 30.0000 mL | Freq: Every day | ORAL | 0 refills | Status: AC

## 2018-08-09 MED ORDER — FAMOTIDINE 40 MG/5ML PO SUSR: 20.0000 mg | Freq: Two times a day (BID) | ORAL | 0 refills | Status: AC

## 2018-08-09 MED ORDER — ORAL CARE MOUTH RINSE: 15.0000 mL | OROMUCOSAL | 0 refills | Status: AC

## 2018-08-09 MED ORDER — AMLODIPINE BESYLATE 5 MG PO TABS: 5.0000 mg | ORAL_TABLET | Freq: Every day | ORAL | Status: AC

## 2018-08-09 NOTE — Discharge Summary (Addendum)
DISCHARGE SUMMARY  Hannah Holloway  MR#: [id number]  DOB:[birth year]  Date of Admission: 07/24/2018 Date of Discharge: 08/09/2018  Attending Physician:Juliannah Ohmann Hennie Duos, MD  Patient's OEU:[id number], No Pcp Per  Consults: Neurology Neurosurgery PCCM  Disposition: D/C to [facility]    Discharge Diagnoses: Acute hypoxic resp failure s/p tracheostomy Acute metabolic encephalopathy  Central tachypnea related to intracranial process. Acute pulmonary embolism w/ B LE DVTs Acute intracranial hemorrhage associated with cerebral edema Dysphagia HTN Recurrent urinary retention    Initial presentation: 72 year old female with a hx dementia who presented with general weakness after being found down at home. Work-up revealed acute PE and bilateral lower extremities DVTs. Hospital course was complicated by a large left IPH and IVH with midline shift in the setting of anticoagulation.   Hospital Course: 11/20 admit w/ Acute PE and b/l lower leg DVTs 11/21 New ICH > ETT 11/25 IVC filter placed by IR; off cleviprex 11/26 trach - PEG tube placed 11/30 started on ATC 12/1 and 12/2 tolerated ATC throughout day 12/3 TRH assumed care  Acute hypoxic resp failure s/p tracheostomy S/p trach on 11/26 - tolerating ATC w/o trouble - PCCM changed to #6 cuffless trach 12/03 - consider downsizing toward decannulation if continues to thrive   Acute metabolic encephalopathy 2/2 to intracranial hemorrhage Poor prognosis in the setting of dementia, recent PE, B/L LE DVT, and advanced age   Central tachypnea related to intracranial process No additional interventions required -was evaluated by neurosurgery  Acute pulmonary embolism w/ B LE DVTs IVC filter placed due to [place] - no plans for anticoagulation at this time given above  Acute intracranial hemorrhage associated with cerebral edema concern for cerebral amyloid angiopathy in the setting of dementia + IV heparin - no intervention required from NS  standpoint   Dysphagia Continue tube feedings and aspiration precautions   HTN Continue amlodipine 5 mg daily  Recurrent urinary retention  Required foley for extended period of time during hospitalization   Allergies as of 08/09/2018   No Known Allergies     Medication List    STOP taking these medications   Vitamin D3 25 MCG (1000 UT) Caps   vitamin E 400 UNIT capsule     TAKE these medications   acetaminophen 325 MG tablet Commonly known as:  TYLENOL Place 2 tablets (650 mg total) into feeding tube every 6 (six) hours as needed for mild pain (or Fever >/= 101).   amLODipine 5 MG tablet Commonly known as:  NORVASC Place 1 tablet (5 mg total) into feeding tube daily. Start taking on:  08/10/2018   bethanechol 10 MG tablet Commonly known as:  URECHOLINE Place 1 tablet (10 mg total) into feeding tube 3 (three) times daily.   chlorhexidine gluconate (MEDLINE KIT) 0.12 % solution Commonly known as:  PERIDEX 15 mLs by Mouth Rinse route 2 (two) times daily.   donepezil 10 MG tablet Commonly known as:  ARICEPT Place 1 tablet (10 mg total) into feeding tube at bedtime.   famotidine 40 MG/5ML suspension Commonly known as:  PEPCID Place 2.5 mLs (20 mg total) into feeding tube every 12 (twelve) hours.   feeding supplement (OSMOLITE 1.2 CAL) Liqd Place 1,000 mLs into feeding tube continuous.   feeding supplement (PRO-STAT SUGAR FREE 64) Liqd Place 30 mLs into feeding tube daily. Start taking on:  08/10/2018   ipratropium-albuterol 0.5-2.5 (3) MG/3ML Soln Commonly known as:  DUONEB Take 3 mLs by nebulization every 4 (four) hours as needed.   mouth  rinse Liqd solution 15 mLs by Mouth Rinse route every 4 (four) hours.   ondansetron 4 MG tablet Commonly known as:  ZOFRAN Place 1 tablet (4 mg total) into feeding tube every 6 (six) hours as needed for nausea.   sodium chloride flush 0.9 % Soln Commonly known as:  NS 10-40 mLs by Intracatheter route every 12 (twelve)  hours.   sodium chloride flush 0.9 % Soln Commonly known as:  NS 10-40 mLs by Intracatheter route as needed (flush).       Day of Discharge BP 137/74 (BP Location: Right Arm)   Pulse (!) 102   Temp 98.8 F (37.1 C) (Oral)   Resp (!) 23   Ht _0  (1.676 m)   Wt 73.6 kg   SpO2 100%   BMI 26.19 kg/m   Physical Exam: General: No acute respiratory distress - does not open eyes or follow commands Lungs: Clear to auscultation bilaterally without wheezes or crackles Cardiovascular: Regular rate and rhythm without murmur Abdomen: Nontender, nondistended, soft, bowel sounds positive, no rebound Extremities: No significant edema bilateral lower extremities  Basic Metabolic Panel: Recent Labs  Lab 08/03/18 0519 08/05/18 0756 08/07/18 0311 08/08/18 0708  NA 143 138 139 140  K 4.2 4.0 4.8 4.5  CL 110 105 103 103  CO2 _1 GLUCOSE 148* 172* 177* 187*  BUN 16 20 27* 25*  CREATININE 0.63 0.56 0.90 0.65  CALCIUM 9.3 8.8* 9.7 9.3  MG  --   --   --  2.1  PHOS  --   --   --  3.0    Liver Function Tests: Recent Labs  Lab 08/07/18 0311 08/08/18 0708  AST 133* 108*  ALT 450* 328*  ALKPHOS 108 94  BILITOT 0.6 0.7  PROT 7.4 7.4  ALBUMIN 2.8* 2.6*    CBC: Recent Labs  Lab 08/03/18 0519 08/05/18 0756 08/07/18 0311 08/08/18 0708  WBC 7.3 5.9 8.3 6.6  NEUTROABS  --   --   --  4.5  HGB 9.2* 10.9* 10.2* 9.6*  HCT 30.0* 34.7* 32.0* 29.8*  MCV 97.1 93.3 92.8 92.8  PLT 172 245 290 300    CBG: Recent Labs  Lab 08/08/18 1804 08/08/18 1959 08/08/18 2301 08/09/18 0353 08/09/18 0847  GLUCAP 136* 142* 129* 143* 179*    Time spent in discharge (includes decision making & examination of pt): 35 minutes  08/09/2018, 9:41 AM   Cherene Altes, MD [facility] Office  [phone] Pager [phone]  On-Call/Text Page:      [email or web address]      password [facility]

## 2018-10-05 DEATH — deceased

## 2018-10-29 ENCOUNTER — Other Ambulatory Visit: Payer: Self-pay | Admitting: Interventional Radiology

## 2018-10-29 DIAGNOSIS — Z95828 Presence of other vascular implants and grafts: Secondary | ICD-10-CM

## 2018-11-06 ENCOUNTER — Telehealth: Payer: Self-pay | Admitting: Radiology

## 2018-11-06 NOTE — Telephone Encounter (Signed)
Spoke w/ ICU nurse at [facility], [place], [place].  Patient deceased 09/19/18.    Zylen Wenig Carmell Austria, RN 11/06/2018 4:24 PM

## 2019-11-17 IMAGING — US IR IVC FILTER PLMT / S&I /IMG GUID/MOD SED
1 series · 2 of 2 positions shown · non-contrast
Comparison: none

CLINICAL DATA: Lower extremity DVT. Acute intracranial hemorrhage,
a relative contraindication to anticoagulation. Caval filtration
requested.

EXAM:
INFERIOR VENACAVOGRAM
IVC FILTER PLACEMENT UNDER FLUOROSCOPY
FLUOROSCOPY TIME:  30 seconds; 11 mGy
TECHNIQUE: Patency of the right IJ vein was confirmed with ultrasound with
image documentation. An appropriate skin site was determined. Skin
site was marked, prepped with chlorhexidine, and draped using
maximum barrier technique. The region was infiltrated locally with
1% lidocaine.

[Series 1: ir ivc filter plmt / s&i /img guid/mod sed · 2 of 2 slices shown]
[im 1/2]
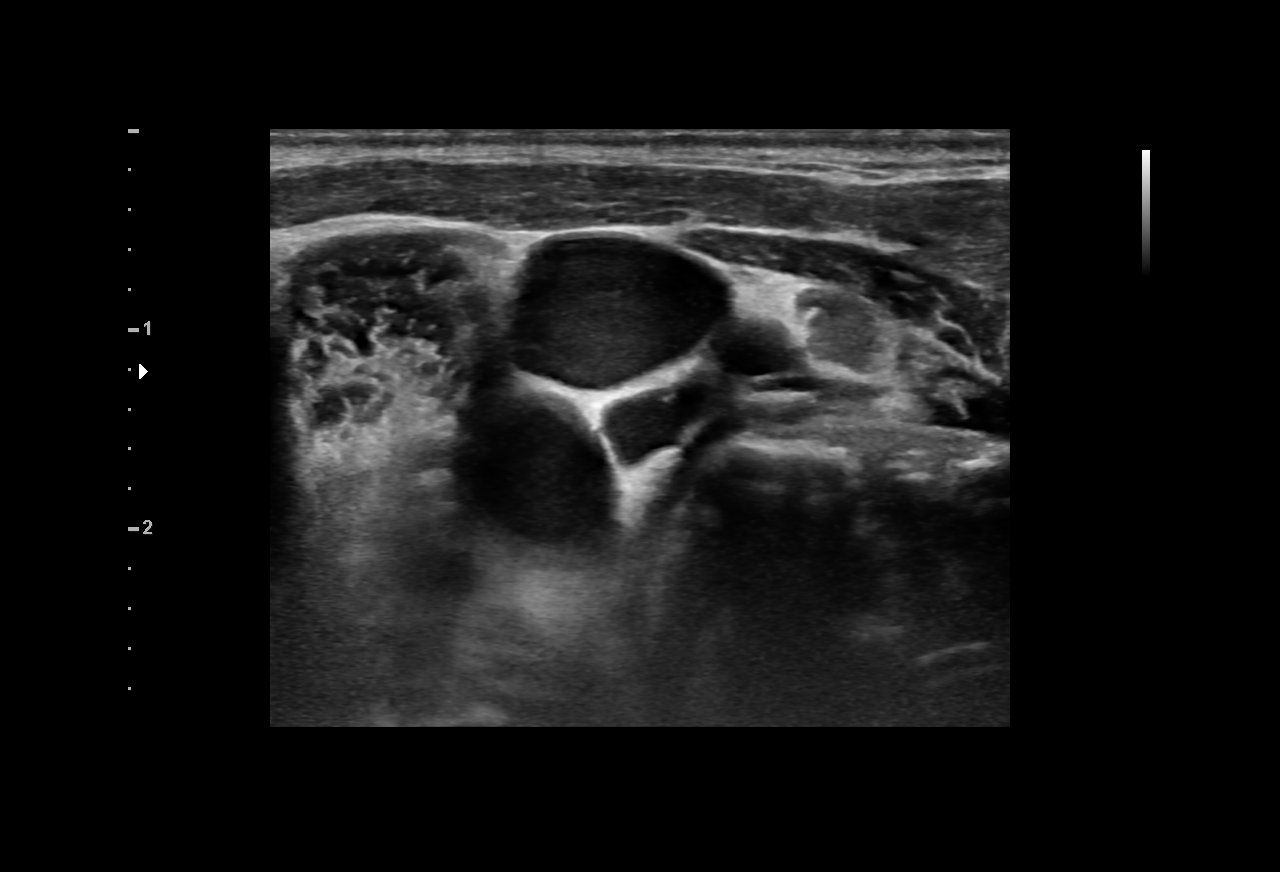
[im 2/2]
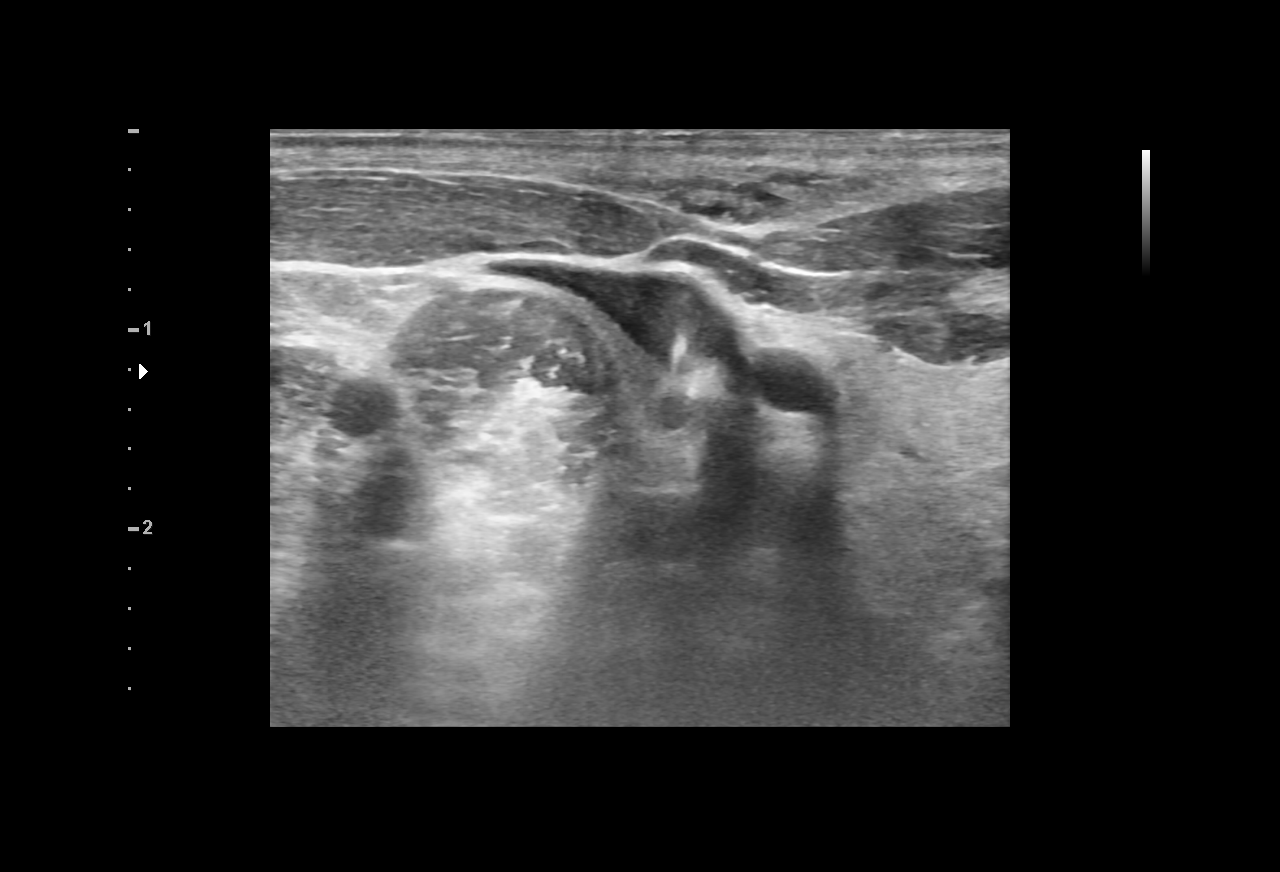

[2 of 2 positions shown; findings below may reference images not displayed]

Intravenous Fentanyl and Versed were administered as conscious
sedation during continuous monitoring of the patient's level of
consciousness and physiological / cardiorespiratory status by the
radiology RN, with a total moderate sedation time of 10 minutes.

Under real-time ultrasound guidance, the right IJ vein was accessed
with a 21 gauge micropuncture needle; the needle tip within the vein
was confirmed with ultrasound image documentation. The needle was
exchanged over a 018 guidewire for a transitional dilator, which
allow advancement of the Benson wire into the IVC. A long 6 French
vascular sheath was placed for inferior venacavography. This
demonstrated no caval thrombus. Renal vein inflows were evident.

The Denali IVC filter was advanced through the sheath and
successfully deployed under fluoroscopy at the L2 level. Followup
cavagram demonstrates stable filter position and no evident
complication. The sheath was removed and hemostasis achieved at the
site. No immediate complication.
IMPRESSION: 1. Normal IVC. No thrombus or significant anatomic variation.
2. Technically successful infrarenal IVC filter placement. This is a
retrievable model.

PLAN:
This IVC filter is potentially retrievable. The patient will be
approximately 8-12 weeks. Further recommendations regarding filter
retrieval, continued surveillance or declaration of device
permanence, will be made at that time.

## 2019-11-17 IMAGING — DX DG CHEST 1V PORT
1 series · 1 of 1 positions shown · non-contrast
Comparison: Portable chest x-ray July 25, 2018

CLINICAL DATA: Acute respiratory failure

EXAM:
PORTABLE CHEST 1 VIEW

[chest]
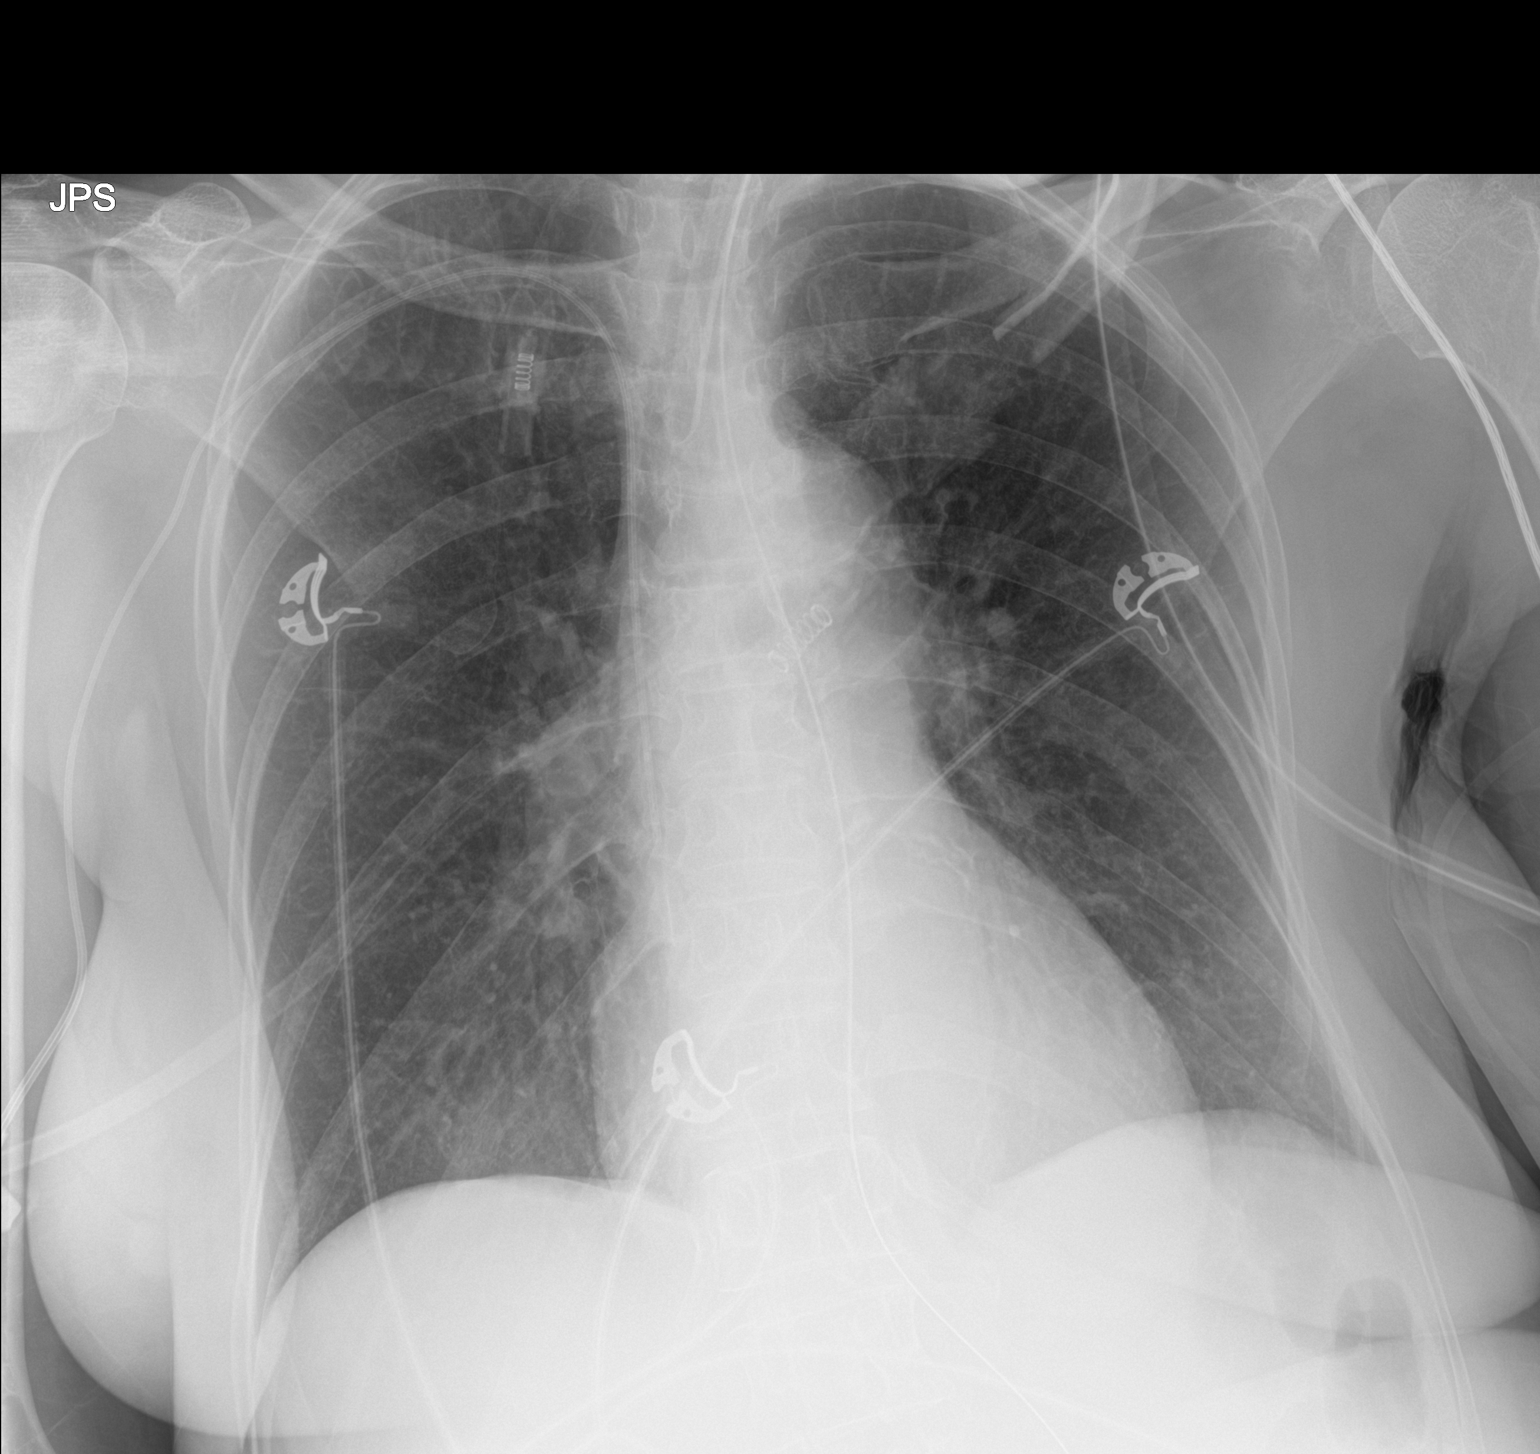

[1 of 1 positions shown; findings below may reference images not displayed]

FINDINGS: The patient is mildly rotated on this study. The lungs are
well-expanded. There is no focal infiltrate. There is no pleural
effusion or pneumothorax. The endotracheal tube tip projects
approximately 3.9 cm above the carina. The heart and pulmonary
vascularity are normal. There is calcification in the wall of the
thoracic aorta. The PICC line tip projects over the distal third of
the SVC. The esophagogastric tube tip and proximal port project
below the GE junction.
IMPRESSION: There is no active cardiopulmonary disease. There is reasonable
positioning of the support tubes.

## 2019-11-25 IMAGING — DX DG CHEST 1V PORT
1 series · 1 of 1 positions shown · non-contrast
Comparison: 08/01/2018

CLINICAL DATA: Tracheostomy exchange

EXAM:
PORTABLE CHEST 1 VIEW

[chest]
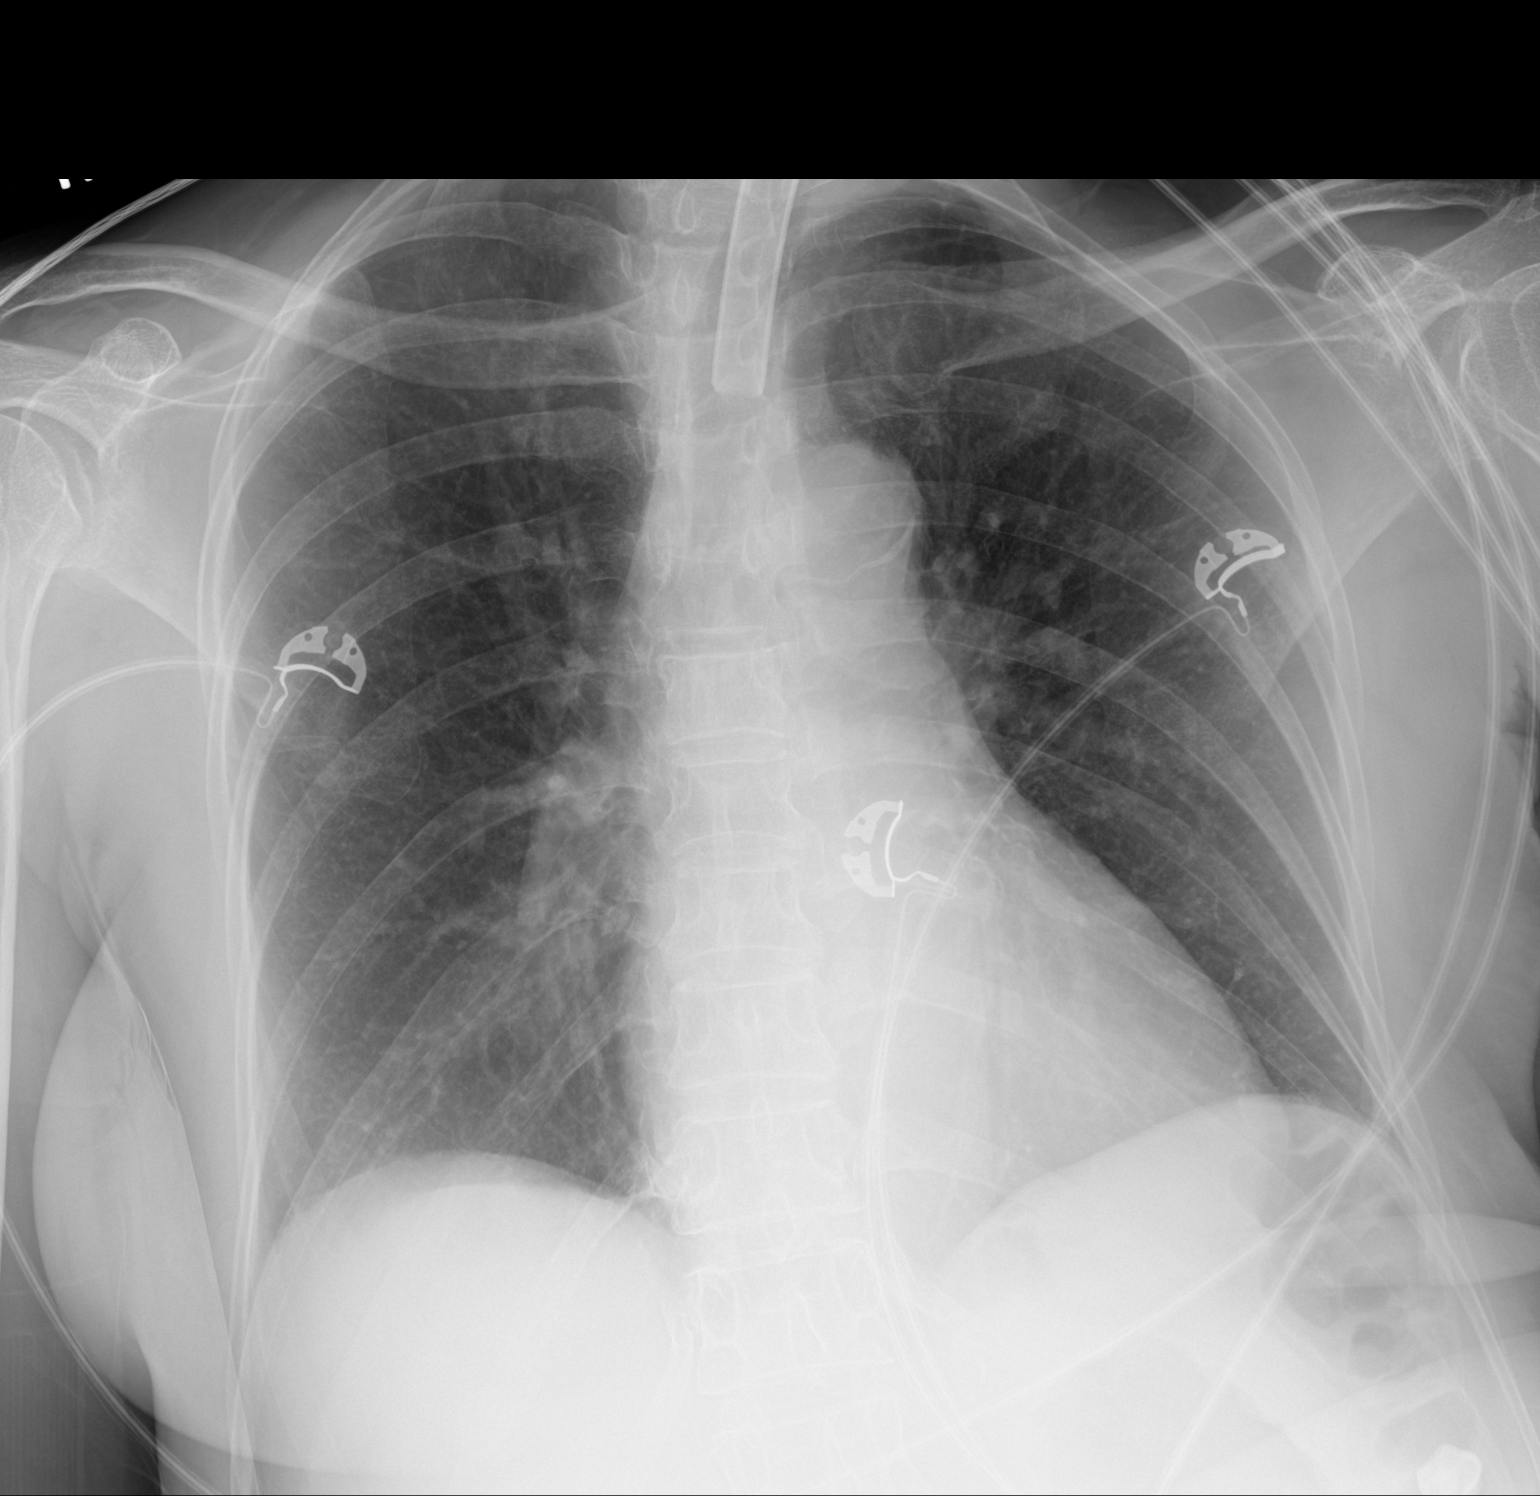

[1 of 1 positions shown; findings below may reference images not displayed]

FINDINGS: Cardiac shadow is stable. Aortic calcifications are again seen.
Right-sided PICC line has been removed in the interval. Tracheostomy
tube is noted in satisfactory position. The lungs are clear.
IMPRESSION: No acute abnormality noted.
